# Patient Record
Sex: Female | Born: 1980 | Race: White | Hispanic: No | Marital: Married | Smoking: Never smoker
Health system: Southern US, Community
[De-identification: ages and names within clinical notes are randomized; demographics above are authoritative.]

## PROBLEM LIST (undated history)

## (undated) DIAGNOSIS — Z8049 Family history of malignant neoplasm of other genital organs: Secondary | ICD-10-CM

## (undated) DIAGNOSIS — J40 Bronchitis, not specified as acute or chronic: Secondary | ICD-10-CM

## (undated) DIAGNOSIS — Z8051 Family history of malignant neoplasm of kidney: Secondary | ICD-10-CM

## (undated) DIAGNOSIS — Z8041 Family history of malignant neoplasm of ovary: Secondary | ICD-10-CM

## (undated) DIAGNOSIS — E039 Hypothyroidism, unspecified: Secondary | ICD-10-CM

## (undated) DIAGNOSIS — T753XXA Motion sickness, initial encounter: Secondary | ICD-10-CM

## (undated) DIAGNOSIS — Z800 Family history of malignant neoplasm of digestive organs: Secondary | ICD-10-CM

## (undated) DIAGNOSIS — G43909 Migraine, unspecified, not intractable, without status migrainosus: Secondary | ICD-10-CM

## (undated) DIAGNOSIS — Z803 Family history of malignant neoplasm of breast: Secondary | ICD-10-CM

## (undated) HISTORY — DX: Family history of malignant neoplasm of other genital organs: Z80.49

## (undated) HISTORY — DX: Family history of malignant neoplasm of kidney: Z80.51

## (undated) HISTORY — DX: Family history of malignant neoplasm of digestive organs: Z80.0

## (undated) HISTORY — PX: OTHER SURGICAL HISTORY: SHX169

## (undated) HISTORY — DX: Family history of malignant neoplasm of ovary: Z80.41

## (undated) HISTORY — DX: Family history of malignant neoplasm of breast: Z80.3

## (undated) HISTORY — PX: COLONOSCOPY: SHX174

---

## 2011-11-21 ENCOUNTER — Emergency Department: Payer: Self-pay | Admitting: Emergency Medicine

## 2011-12-31 ENCOUNTER — Ambulatory Visit: Payer: Self-pay | Admitting: Family Medicine

## 2012-01-10 ENCOUNTER — Emergency Department: Payer: Self-pay | Admitting: Emergency Medicine

## 2012-01-10 LAB — URINALYSIS, COMPLETE
Bacteria: NONE SEEN
Bilirubin,UR: NEGATIVE
Blood: NEGATIVE
Glucose,UR: NEGATIVE mg/dL (ref 0–75)
Leukocyte Esterase: NEGATIVE
Protein: NEGATIVE
RBC,UR: NONE SEEN /HPF (ref 0–5)

## 2012-01-10 LAB — PREGNANCY, URINE: Pregnancy Test, Urine: NEGATIVE m[IU]/mL

## 2012-01-10 LAB — CBC
HCT: 36 % (ref 35.0–47.0)
HGB: 12.4 g/dL (ref 12.0–16.0)
MCV: 87 fL (ref 80–100)
RDW: 12.3 % (ref 11.5–14.5)

## 2012-01-10 LAB — COMPREHENSIVE METABOLIC PANEL
Albumin: 4.4 g/dL (ref 3.4–5.0)
Alkaline Phosphatase: 44 U/L — ABNORMAL LOW (ref 50–136)
Calcium, Total: 8.6 mg/dL (ref 8.5–10.1)
Co2: 22 mmol/L (ref 21–32)
Creatinine: 0.64 mg/dL (ref 0.60–1.30)
EGFR (Non-African Amer.): >60
Glucose: 82 mg/dL (ref 65–99)
Potassium: 3.6 mmol/L (ref 3.5–5.1)
SGPT (ALT): 33 U/L
Sodium: 140 mmol/L (ref 136–145)

## 2012-01-10 LAB — TSH: Thyroid Stimulating Horm: 3.64 u[IU]/mL

## 2012-01-12 ENCOUNTER — Ambulatory Visit: Payer: Self-pay | Admitting: Family Medicine

## 2012-08-06 ENCOUNTER — Ambulatory Visit: Payer: Self-pay | Admitting: Family Medicine

## 2013-11-24 HISTORY — PX: OTHER SURGICAL HISTORY: SHX169

## 2014-07-19 LAB — OB RESULTS CONSOLE ABO/RH: RH TYPE: POSITIVE

## 2014-07-19 LAB — OB RESULTS CONSOLE ANTIBODY SCREEN: ANTIBODY SCREEN: NEGATIVE

## 2014-07-19 LAB — OB RESULTS CONSOLE RPR: RPR: NONREACTIVE

## 2014-07-19 LAB — OB RESULTS CONSOLE HIV ANTIBODY (ROUTINE TESTING): HIV: NONREACTIVE

## 2014-07-19 LAB — OB RESULTS CONSOLE HEPATITIS B SURFACE ANTIGEN: Hepatitis B Surface Ag: NEGATIVE

## 2014-07-19 LAB — OB RESULTS CONSOLE RUBELLA ANTIBODY, IGM: RUBELLA: IMMUNE

## 2014-12-25 LAB — OB RESULTS CONSOLE GBS: GBS: NEGATIVE

## 2015-01-10 ENCOUNTER — Inpatient Hospital Stay (HOSPITAL_COMMUNITY): Payer: BLUE CROSS/BLUE SHIELD | Admitting: Certified Registered"

## 2015-01-10 ENCOUNTER — Encounter (HOSPITAL_COMMUNITY): Payer: Self-pay | Admitting: *Deleted

## 2015-01-10 ENCOUNTER — Inpatient Hospital Stay (HOSPITAL_COMMUNITY)
Admission: AD | Admit: 2015-01-10 | Discharge: 2015-01-13 | DRG: 766 | Disposition: A | Discharge status: Home / Self Care | Payer: BLUE CROSS/BLUE SHIELD | Source: Ambulatory Visit | Attending: Obstetrics and Gynecology | Admitting: Obstetrics and Gynecology

## 2015-01-10 ENCOUNTER — Inpatient Hospital Stay (HOSPITAL_COMMUNITY)
Admission: AD | Admit: 2015-01-10 | Discharge: 2015-01-10 | Disposition: A | Discharge status: Home / Self Care | Payer: BLUE CROSS/BLUE SHIELD | Source: Ambulatory Visit | Attending: Obstetrics and Gynecology | Admitting: Obstetrics and Gynecology

## 2015-01-10 ENCOUNTER — Encounter (HOSPITAL_COMMUNITY): Payer: Self-pay

## 2015-01-10 ENCOUNTER — Encounter (HOSPITAL_COMMUNITY)
Admission: AD | Disposition: A | Discharge status: Home / Self Care | Payer: Self-pay | Source: Ambulatory Visit | Attending: Obstetrics and Gynecology

## 2015-01-10 DIAGNOSIS — Z98891 History of uterine scar from previous surgery: Secondary | ICD-10-CM

## 2015-01-10 DIAGNOSIS — O3421 Maternal care for scar from previous cesarean delivery: Secondary | ICD-10-CM | POA: Diagnosis present

## 2015-01-10 DIAGNOSIS — Z3A38 38 weeks gestation of pregnancy: Secondary | ICD-10-CM | POA: Diagnosis present

## 2015-01-10 DIAGNOSIS — R21 Rash and other nonspecific skin eruption: Secondary | ICD-10-CM | POA: Diagnosis not present

## 2015-01-10 DIAGNOSIS — Z3A39 39 weeks gestation of pregnancy: Secondary | ICD-10-CM | POA: Diagnosis not present

## 2015-01-10 DIAGNOSIS — O471 False labor at or after 37 completed weeks of gestation: Secondary | ICD-10-CM | POA: Diagnosis present

## 2015-01-10 DIAGNOSIS — O99284 Endocrine, nutritional and metabolic diseases complicating childbirth: Secondary | ICD-10-CM | POA: Diagnosis present

## 2015-01-10 DIAGNOSIS — IMO0001 Reserved for inherently not codable concepts without codable children: Secondary | ICD-10-CM

## 2015-01-10 DIAGNOSIS — E039 Hypothyroidism, unspecified: Secondary | ICD-10-CM | POA: Diagnosis present

## 2015-01-10 HISTORY — DX: Hypothyroidism, unspecified: E03.9

## 2015-01-10 LAB — CBC
HCT: 36.7 % (ref 36.0–46.0)
Hemoglobin: 12.8 g/dL (ref 12.0–15.0)
MCH: 30.7 pg (ref 26.0–34.0)
MCHC: 34.9 g/dL (ref 30.0–36.0)
MCV: 88 fL (ref 78.0–100.0)
PLATELETS: 274 10*3/uL (ref 150–400)
RBC: 4.17 MIL/uL (ref 3.87–5.11)
RDW: 12.8 % (ref 11.5–15.5)
WBC: 9.4 10*3/uL (ref 4.0–10.5)

## 2015-01-10 LAB — TYPE AND SCREEN
ABO/RH(D): A POS
Antibody Screen: NEGATIVE

## 2015-01-10 LAB — ABO/RH: ABO/RH(D): A POS

## 2015-01-10 SURGERY — Surgical Case
Anesthesia: Spinal

## 2015-01-10 MED ORDER — LACTATED RINGERS IV SOLN
INTRAVENOUS | Status: DC | PRN
  Administered 2015-01-10 (×2): via INTRAVENOUS

## 2015-01-10 MED ORDER — CEFAZOLIN SODIUM-DEXTROSE 2-3 GM-% IV SOLR
INTRAVENOUS | Status: AC
  Filled 2015-01-10: qty 150

## 2015-01-10 MED ORDER — NALOXONE HCL 0.4 MG/ML IJ SOLN: 0.4000 mg | INTRAMUSCULAR | Status: DC | PRN

## 2015-01-10 MED ORDER — DIPHENHYDRAMINE HCL 25 MG PO CAPS: 25.0000 mg | ORAL_CAPSULE | ORAL | Status: DC | PRN

## 2015-01-10 MED ORDER — MEPERIDINE HCL 25 MG/ML IJ SOLN
6.2500 mg | INTRAMUSCULAR | Status: DC | PRN
Start: 2015-01-10 — End: 2015-01-10
  Administered 2015-01-10 (×2): 6.25 mg via INTRAVENOUS

## 2015-01-10 MED ORDER — DEXTROSE 5 % IV SOLN
1.0000 ug/kg/h | INTRAVENOUS | Status: DC | PRN
  Filled 2015-01-10: qty 2

## 2015-01-10 MED ORDER — CITRIC ACID-SODIUM CITRATE 334-500 MG/5ML PO SOLN
30.0000 mL | Freq: Once | ORAL | Status: DC
Start: 2015-01-10 — End: 2015-01-10

## 2015-01-10 MED ORDER — ONDANSETRON HCL 4 MG/2ML IJ SOLN
INTRAMUSCULAR | Status: DC | PRN
  Administered 2015-01-10: 4 mg via INTRAVENOUS

## 2015-01-10 MED ORDER — OXYCODONE-ACETAMINOPHEN 5-325 MG PO TABS
2.0000 | ORAL_TABLET | ORAL | Status: DC | PRN
Start: 2015-01-10 — End: 2015-01-13

## 2015-01-10 MED ORDER — SIMETHICONE 80 MG PO CHEW
80.0000 mg | CHEWABLE_TABLET | ORAL | Status: DC
  Administered 2015-01-11 – 2015-01-12 (×2): 80 mg via ORAL
  Filled 2015-01-10 (×3): qty 1

## 2015-01-10 MED ORDER — OXYTOCIN 40 UNITS IN LACTATED RINGERS INFUSION - SIMPLE MED
62.5000 mL/h | INTRAVENOUS | Status: DC
  Filled 2015-01-10: qty 1000

## 2015-01-10 MED ORDER — CEFAZOLIN SODIUM-DEXTROSE 2-3 GM-% IV SOLR
INTRAVENOUS | Status: DC | PRN
  Administered 2015-01-10: 2 g via INTRAVENOUS

## 2015-01-10 MED ORDER — LACTATED RINGERS IV SOLN: INTRAVENOUS | Status: DC

## 2015-01-10 MED ORDER — LACTATED RINGERS IV SOLN
INTRAVENOUS | Status: DC
  Administered 2015-01-11 (×2): via INTRAVENOUS

## 2015-01-10 MED ORDER — MEPERIDINE HCL 25 MG/ML IJ SOLN: 6.2500 mg | INTRAMUSCULAR | Status: DC | PRN

## 2015-01-10 MED ORDER — LACTATED RINGERS IV BOLUS (SEPSIS): 1000.0000 mL | Freq: Once | INTRAVENOUS | Status: DC

## 2015-01-10 MED ORDER — CITRIC ACID-SODIUM CITRATE 334-500 MG/5ML PO SOLN
30.0000 mL | ORAL | Status: DC | PRN
  Administered 2015-01-10: 30 mL via ORAL

## 2015-01-10 MED ORDER — OXYTOCIN 40 UNITS IN LACTATED RINGERS INFUSION - SIMPLE MED: 62.5000 mL/h | INTRAVENOUS | Status: AC

## 2015-01-10 MED ORDER — ACETAMINOPHEN 500 MG PO TABS
1000.0000 mg | ORAL_TABLET | Freq: Four times a day (QID) | ORAL | Status: AC
  Administered 2015-01-11 (×4): 1000 mg via ORAL
  Filled 2015-01-10 (×4): qty 2

## 2015-01-10 MED ORDER — BUPIVACAINE IN DEXTROSE 0.75-8.25 % IT SOLN
INTRATHECAL | Status: DC | PRN
  Administered 2015-01-10: 1.8 mL via INTRATHECAL

## 2015-01-10 MED ORDER — KETOROLAC TROMETHAMINE 30 MG/ML IJ SOLN
INTRAMUSCULAR | Status: AC
  Filled 2015-01-10: qty 1

## 2015-01-10 MED ORDER — ONDANSETRON HCL 4 MG/2ML IJ SOLN: 4.0000 mg | INTRAMUSCULAR | Status: DC | PRN

## 2015-01-10 MED ORDER — ACETAMINOPHEN 325 MG PO TABS: 650.0000 mg | ORAL_TABLET | ORAL | Status: DC | PRN

## 2015-01-10 MED ORDER — LACTATED RINGERS IV SOLN
INTRAVENOUS | Status: DC | PRN
  Administered 2015-01-10: 19:00:00 via INTRAVENOUS

## 2015-01-10 MED ORDER — CITRIC ACID-SODIUM CITRATE 334-500 MG/5ML PO SOLN
ORAL | Status: AC
Start: 2015-01-10 — End: 2015-01-11
  Filled 2015-01-10: qty 15

## 2015-01-10 MED ORDER — MEPERIDINE HCL 25 MG/ML IJ SOLN
INTRAMUSCULAR | Status: AC
  Filled 2015-01-10: qty 1

## 2015-01-10 MED ORDER — SENNOSIDES-DOCUSATE SODIUM 8.6-50 MG PO TABS
2.0000 | ORAL_TABLET | ORAL | Status: DC
  Administered 2015-01-11 – 2015-01-12 (×3): 2 via ORAL
  Filled 2015-01-10 (×3): qty 2

## 2015-01-10 MED ORDER — ONDANSETRON HCL 4 MG/2ML IJ SOLN: 4.0000 mg | Freq: Four times a day (QID) | INTRAMUSCULAR | Status: DC | PRN

## 2015-01-10 MED ORDER — OXYTOCIN 10 UNIT/ML IJ SOLN
40.0000 [IU] | INTRAMUSCULAR | Status: DC | PRN
  Administered 2015-01-10: 40 [IU] via INTRAVENOUS

## 2015-01-10 MED ORDER — LANOLIN HYDROUS EX OINT: 1.0000 "application " | TOPICAL_OINTMENT | CUTANEOUS | Status: DC | PRN

## 2015-01-10 MED ORDER — CEFAZOLIN SODIUM-DEXTROSE 2-3 GM-% IV SOLR: 2.0000 g | INTRAVENOUS | Status: DC

## 2015-01-10 MED ORDER — EPHEDRINE 5 MG/ML INJ: 10.0000 mg | INTRAVENOUS | Status: DC | PRN

## 2015-01-10 MED ORDER — LIDOCAINE HCL (PF) 1 % IJ SOLN
30.0000 mL | INTRAMUSCULAR | Status: DC | PRN
  Filled 2015-01-10: qty 30

## 2015-01-10 MED ORDER — ONDANSETRON HCL 4 MG/2ML IJ SOLN: 4.0000 mg | Freq: Three times a day (TID) | INTRAMUSCULAR | Status: DC | PRN

## 2015-01-10 MED ORDER — OXYCODONE-ACETAMINOPHEN 5-325 MG PO TABS: 1.0000 | ORAL_TABLET | ORAL | Status: DC | PRN

## 2015-01-10 MED ORDER — FENTANYL CITRATE 0.05 MG/ML IJ SOLN
INTRAMUSCULAR | Status: AC
  Filled 2015-01-10: qty 5

## 2015-01-10 MED ORDER — PHENYLEPHRINE 40 MCG/ML (10ML) SYRINGE FOR IV PUSH (FOR BLOOD PRESSURE SUPPORT): 80.0000 ug | PREFILLED_SYRINGE | INTRAVENOUS | Status: DC | PRN

## 2015-01-10 MED ORDER — LACTATED RINGERS IV SOLN: 500.0000 mL | INTRAVENOUS | Status: DC | PRN

## 2015-01-10 MED ORDER — TETANUS-DIPHTH-ACELL PERTUSSIS 5-2.5-18.5 LF-MCG/0.5 IM SUSP
0.5000 mL | Freq: Once | INTRAMUSCULAR | Status: DC
Start: 2015-01-11 — End: 2015-01-13

## 2015-01-10 MED ORDER — SODIUM CHLORIDE 0.9 % IJ SOLN: 3.0000 mL | INTRAMUSCULAR | Status: DC | PRN

## 2015-01-10 MED ORDER — FAMOTIDINE IN NACL 20-0.9 MG/50ML-% IV SOLN: 20.0000 mg | Freq: Once | INTRAVENOUS | Status: DC

## 2015-01-10 MED ORDER — ONDANSETRON HCL 4 MG PO TABS: 4.0000 mg | ORAL_TABLET | ORAL | Status: DC | PRN

## 2015-01-10 MED ORDER — ZOLPIDEM TARTRATE 5 MG PO TABS: 5.0000 mg | ORAL_TABLET | Freq: Every evening | ORAL | Status: DC | PRN

## 2015-01-10 MED ORDER — MENTHOL 3 MG MT LOZG: 1.0000 | LOZENGE | OROMUCOSAL | Status: DC | PRN

## 2015-01-10 MED ORDER — PHENYLEPHRINE 8 MG IN D5W 100 ML (0.08MG/ML) PREMIX OPTIME
INJECTION | INTRAVENOUS | Status: DC | PRN
  Administered 2015-01-10: 60 ug/min via INTRAVENOUS

## 2015-01-10 MED ORDER — NALBUPHINE HCL 10 MG/ML IJ SOLN: 5.0000 mg | INTRAMUSCULAR | Status: DC | PRN

## 2015-01-10 MED ORDER — DIPHENHYDRAMINE HCL 25 MG PO CAPS: 25.0000 mg | ORAL_CAPSULE | Freq: Four times a day (QID) | ORAL | Status: DC | PRN

## 2015-01-10 MED ORDER — OXYTOCIN BOLUS FROM INFUSION: 500.0000 mL | INTRAVENOUS | Status: DC

## 2015-01-10 MED ORDER — IBUPROFEN 600 MG PO TABS
600.0000 mg | ORAL_TABLET | Freq: Four times a day (QID) | ORAL | Status: DC
  Administered 2015-01-11 – 2015-01-13 (×10): 600 mg via ORAL
  Filled 2015-01-10 (×10): qty 1

## 2015-01-10 MED ORDER — FENTANYL 2.5 MCG/ML BUPIVACAINE 1/10 % EPIDURAL INFUSION (WH - ANES): 14.0000 mL/h | INTRAMUSCULAR | Status: DC | PRN

## 2015-01-10 MED ORDER — OXYCODONE-ACETAMINOPHEN 5-325 MG PO TABS: 2.0000 | ORAL_TABLET | ORAL | Status: DC | PRN

## 2015-01-10 MED ORDER — LACTATED RINGERS IV SOLN
INTRAVENOUS | Status: DC
  Administered 2015-01-10: 17:00:00 via INTRAVENOUS

## 2015-01-10 MED ORDER — SCOPOLAMINE 1 MG/3DAYS TD PT72
1.0000 | MEDICATED_PATCH | Freq: Once | TRANSDERMAL | Status: DC
  Administered 2015-01-10: 1.5 mg via TRANSDERMAL

## 2015-01-10 MED ORDER — SIMETHICONE 80 MG PO CHEW
80.0000 mg | CHEWABLE_TABLET | Freq: Three times a day (TID) | ORAL | Status: DC
  Administered 2015-01-11 – 2015-01-12 (×6): 80 mg via ORAL
  Filled 2015-01-10 (×7): qty 1

## 2015-01-10 MED ORDER — KETOROLAC TROMETHAMINE 30 MG/ML IJ SOLN: 30.0000 mg | Freq: Four times a day (QID) | INTRAMUSCULAR | Status: AC | PRN

## 2015-01-10 MED ORDER — CEFAZOLIN SODIUM-DEXTROSE 2-3 GM-% IV SOLR
2.0000 g | Freq: Three times a day (TID) | INTRAVENOUS | Status: AC
  Administered 2015-01-11 (×2): 2 g via INTRAVENOUS
  Filled 2015-01-10 (×2): qty 50

## 2015-01-10 MED ORDER — FENTANYL CITRATE 0.05 MG/ML IJ SOLN: 25.0000 ug | INTRAMUSCULAR | Status: DC | PRN

## 2015-01-10 MED ORDER — NALBUPHINE HCL 10 MG/ML IJ SOLN: 5.0000 mg | Freq: Once | INTRAMUSCULAR | Status: AC | PRN

## 2015-01-10 MED ORDER — ONDANSETRON HCL 4 MG/2ML IJ SOLN: 4.0000 mg | Freq: Once | INTRAMUSCULAR | Status: AC | PRN

## 2015-01-10 MED ORDER — MORPHINE SULFATE (PF) 0.5 MG/ML IJ SOLN
INTRAMUSCULAR | Status: DC | PRN
  Administered 2015-01-10: .2 mg via INTRATHECAL

## 2015-01-10 MED ORDER — OXYCODONE-ACETAMINOPHEN 5-325 MG PO TABS
1.0000 | ORAL_TABLET | ORAL | Status: DC | PRN
  Administered 2015-01-11 – 2015-01-13 (×5): 1 via ORAL
  Filled 2015-01-10 (×6): qty 1

## 2015-01-10 MED ORDER — CITRIC ACID-SODIUM CITRATE 334-500 MG/5ML PO SOLN
ORAL | Status: AC
  Filled 2015-01-10: qty 15

## 2015-01-10 MED ORDER — TERBUTALINE SULFATE 1 MG/ML IJ SOLN
INTRAMUSCULAR | Status: AC
  Administered 2015-01-10: 0.25 mg via SUBCUTANEOUS
  Filled 2015-01-10: qty 1

## 2015-01-10 MED ORDER — DIPHENHYDRAMINE HCL 50 MG/ML IJ SOLN: 12.5000 mg | INTRAMUSCULAR | Status: DC | PRN

## 2015-01-10 MED ORDER — MORPHINE SULFATE 0.5 MG/ML IJ SOLN
INTRAMUSCULAR | Status: AC
  Filled 2015-01-10: qty 10

## 2015-01-10 MED ORDER — BUTORPHANOL TARTRATE 1 MG/ML IJ SOLN
1.0000 mg | Freq: Once | INTRAMUSCULAR | Status: AC
  Administered 2015-01-10: 1 mg via INTRAVENOUS
  Filled 2015-01-10: qty 1

## 2015-01-10 MED ORDER — WITCH HAZEL-GLYCERIN EX PADS: 1.0000 "application " | MEDICATED_PAD | CUTANEOUS | Status: DC | PRN

## 2015-01-10 MED ORDER — DIBUCAINE 1 % RE OINT: 1.0000 "application " | TOPICAL_OINTMENT | RECTAL | Status: DC | PRN

## 2015-01-10 MED ORDER — LACTATED RINGERS IV SOLN: 500.0000 mL | Freq: Once | INTRAVENOUS | Status: DC

## 2015-01-10 MED ORDER — FENTANYL CITRATE 0.05 MG/ML IJ SOLN
INTRAMUSCULAR | Status: AC
  Filled 2015-01-10: qty 2

## 2015-01-10 MED ORDER — SIMETHICONE 80 MG PO CHEW
80.0000 mg | CHEWABLE_TABLET | ORAL | Status: DC | PRN
  Administered 2015-01-12: 80 mg via ORAL

## 2015-01-10 MED ORDER — FENTANYL CITRATE 0.05 MG/ML IJ SOLN
INTRAMUSCULAR | Status: DC | PRN
  Administered 2015-01-10: 10 ug via INTRATHECAL

## 2015-01-10 MED ORDER — SCOPOLAMINE 1 MG/3DAYS TD PT72
MEDICATED_PATCH | TRANSDERMAL | Status: AC
  Filled 2015-01-10: qty 1

## 2015-01-10 MED ORDER — MIDAZOLAM HCL 2 MG/2ML IJ SOLN
INTRAMUSCULAR | Status: AC
  Filled 2015-01-10: qty 2

## 2015-01-10 MED ORDER — KETOROLAC TROMETHAMINE 30 MG/ML IJ SOLN
30.0000 mg | Freq: Four times a day (QID) | INTRAMUSCULAR | Status: AC | PRN
  Administered 2015-01-10: 30 mg via INTRAMUSCULAR

## 2015-01-10 MED ORDER — 0.9 % SODIUM CHLORIDE (POUR BTL) OPTIME
TOPICAL | Status: DC | PRN
  Administered 2015-01-10: 1000 mL

## 2015-01-10 MED ORDER — TERBUTALINE SULFATE 1 MG/ML IJ SOLN
0.2500 mg | Freq: Once | INTRAMUSCULAR | Status: AC
  Administered 2015-01-10: 0.25 mg via SUBCUTANEOUS

## 2015-01-10 SURGICAL SUPPLY — 29 items
CLAMP CORD UMBIL (MISCELLANEOUS) IMPLANT
CLOTH BEACON ORANGE TIMEOUT ST (SAFETY) ×2 IMPLANT
CONTAINER PREFILL 10% NBF 15ML (MISCELLANEOUS) IMPLANT
DRAPE SHEET LG 3/4 BI-LAMINATE (DRAPES) IMPLANT
DRSG OPSITE POSTOP 4X10 (GAUZE/BANDAGES/DRESSINGS) ×2 IMPLANT
DRSG VASELINE 3X18 (GAUZE/BANDAGES/DRESSINGS) ×2 IMPLANT
DURAPREP 26ML APPLICATOR (WOUND CARE) ×2 IMPLANT
ELECT REM PT RETURN 9FT ADLT (ELECTROSURGICAL) ×2
ELECTRODE REM PT RTRN 9FT ADLT (ELECTROSURGICAL) ×1 IMPLANT
EXTRACTOR VACUUM KIWI (MISCELLANEOUS) IMPLANT
EXTRACTOR VACUUM M CUP 4 TUBE (SUCTIONS) IMPLANT
GLOVE BIO SURGEON STRL SZ7.5 (GLOVE) ×2 IMPLANT
GOWN STRL REUS W/TWL LRG LVL3 (GOWN DISPOSABLE) ×4 IMPLANT
KIT ABG SYR 3ML LUER SLIP (SYRINGE) IMPLANT
NEEDLE HYPO 25X5/8 SAFETYGLIDE (NEEDLE) IMPLANT
NS IRRIG 1000ML POUR BTL (IV SOLUTION) ×2 IMPLANT
PACK C SECTION WH (CUSTOM PROCEDURE TRAY) ×2 IMPLANT
PAD OB MATERNITY 4.3X12.25 (PERSONAL CARE ITEMS) ×2 IMPLANT
RTRCTR C-SECT PINK 25CM LRG (MISCELLANEOUS) IMPLANT
STAPLER VISISTAT 35W (STAPLE) ×2 IMPLANT
SUT PLAIN 0 NONE (SUTURE) IMPLANT
SUT VIC AB 0 CT1 36 (SUTURE) ×16 IMPLANT
SUT VIC AB 3-0 CTX 36 (SUTURE) ×2 IMPLANT
SUT VIC AB 3-0 SH 27 (SUTURE) ×1
SUT VIC AB 3-0 SH 27X BRD (SUTURE) ×1 IMPLANT
SUT VIC AB 4-0 KS 27 (SUTURE) IMPLANT
SUT VICRYL 0 TIES 12 18 (SUTURE) IMPLANT
TOWEL OR 17X24 6PK STRL BLUE (TOWEL DISPOSABLE) ×2 IMPLANT
TRAY FOLEY CATH 14FR (SET/KITS/TRAYS/PACK) ×2 IMPLANT

## 2015-01-10 NOTE — Op Note (Signed)
Brandi [name], Brandi [name]              ACCOUNT NO.:  [id number]  MEDICAL RECORD NO.:  [id number]  LOCATION:  [facility]                          FACILITY:  [facility]  PHYSICIAN:  Lucille Passy. Ulanda Edison, M.D. DATE OF BIRTH:  1981/08/19  DATE OF PROCEDURE:  01/10/2015 DATE OF DISCHARGE:                              OPERATIVE REPORT   PREOPERATIVE DIAGNOSES:  Intrauterine pregnancy at 38 weeks 5 days, prior C-section, scheduled for C-section in 6 days, but went into active labor, reached full dilatation and had decelerations of the heart rate. She did not want to take any chance on having a problem, so she wanted the C-section. I advised her that we could try the vaginal and she declined.  POSTOPERATIVE DIAGNOSES:  Intrauterine pregnancy at 38 weeks 5 days, prior C-section, scheduled for C-section in 6 days, but went into active labor, reached full dilatation and had decelerations of the heart rate. She did not want to take any chance on having a problem, so she wanted the C-section.  I advised her that we could try the vaginal and she declined.  OPERATION:  Low-transverse cervical C-section.  OPERATOR:  Melina Schools, MD  ANESTHESIA:  Spinal anesthesia.  In the delivery room, the patient was closed to fully dilated, was pushing, and the fetal heart rate was dropping into the 70s with each push.  The vertex was at a 0 station, LOT.  She was advised of the situation.  She said please let me have a C-section, so we took her to the operating room.  I again informed her that she could possibly push the baby out.  She did not want to try.  Her husband did not want to try.  They wanted a C-section.  The patient was given a spinal anesthetic by Dr. __________.  The urethra was prepped with Betadine solution.  A Foley catheter was inserted to straight drain.  The abdomen was prepped with DuraPrep.  A time-out was done and just before this, I had examined the patient and found the cervix to be fully  dilated. Vertex was at a 0 station in LOT position.  I again informed her that since we were in the operating room, we could let her push; she did not want to.  After a 3-minute delay, the patient was draped as a sterile field.  I pinched the lower abdomen with an Allis clamp.  She was anesthetic.  I made an incision through the old scar, carried in layers through the skin, subcutaneous tissue, and fascia.  The fascia was separated from the rectus muscles superiorly and inferiorly.  Rectus muscle was split in the midline.  Peritoneum was opened vertically.  An Alexis retractor was placed into the abdominal cavity.  The lower uterine segment was exposed.  The head was down in the vagina, so I could not feel the head.  I  made an incision through the lower uterine segment transversely through the superficial layers of the myometrium. I then went the rest of the way into the uterine cavity with my finger, pulled superiorly and inferiorly.  The shoulder was in the incision.  I reached down in the pelvis, delivered the head without difficulty,  suctioned the baby's nose and pharynx, delivered the rest of the baby, and gave the baby to the surgical assistant, who carried the baby to Dr. Allie Bossier, who assigned Apgars.  The baby was crying lustily on the abdominal wall and on its way to the waiting neonatologist.  The cord blood was obtained.  The placenta was removed intact.  The inside of the uterus was found to be free of any debris.  The tubes and ovaries appeared normal as did the uterus because the head was so low in the pelvis.  The incision was close to the cervix, and it did not seem to extend any significant amount, so I closed the uterus in 2 layers using a running lock suture of 0 Vicryl first layer nonlocking suture of the same material on the second layer.  A couple of small bleeders were bovied.  Liberal irrigation confirmed hemostasis.  I had used a pack to pack the bowel out of the  way.  I removed the pack.  There was no bleeding, so before I closed the abdominal wall, the nurse anesthetist said that we had a little bit of blood in the urine, and my impression was that I had probably not injured the bladder, that I probably pushed on the Foley catheter  getting the baby's head out, but I did inspect the bladder very carefully and saw no injury that might have been a serosal tear, and I sutured this with 3-0 Vicryl, but there was no entry into the bladder.  I closed the abdominal wall with interrupted sutures of 0 Vicryl including the rectus muscle and peritoneum in one layer.  I closed the fascia with 2 running sutures of 0 Vicryl.  The subcutaneous tissue with a running 3-0 Vicryl and the skin was closed with automatic staples.  I gave the patient a choice of staples and sutures.  She thought had been closed with staples the first time and elected to go with staples.  The patient seemed to tolerate the procedure well.  Blood loss was thought to be about 800 mL.  Sponge and needle counts were correct, and the patient was returned to recovery in satisfactory condition.     Lucille Passy. Ulanda Edison, M.D.     TFH/MEDQ  D:  01/10/2015  T:  01/10/2015  Job:  [id number]

## 2015-01-10 NOTE — Anesthesia Preprocedure Evaluation (Signed)
Anesthesia Evaluation  Patient identified by MRN, date of birth, ID band Patient awake    Reviewed: Allergy & Precautions, NPO status , Patient's Chart, lab work & pertinent test results, reviewed documented beta blocker date and time Preop documentation limited or incomplete due to emergent nature of procedure.  History of Anesthesia Complications Negative for: history of anesthetic complications  Airway Mallampati: II  TM Distance: >3 FB Neck ROM: Full    Dental no notable dental hx. (+) Dental Advisory Given   Pulmonary neg pulmonary ROS,  breath sounds clear to auscultation  Pulmonary exam normal       Cardiovascular negative cardio ROS  Rhythm:Regular Rate:Normal     Neuro/Psych negative neurological ROS  negative psych ROS   GI/Hepatic negative GI ROS, Neg liver ROS,   Endo/Other  negative endocrine ROSHypothyroidism   Renal/GU negative Renal ROS  negative genitourinary   Musculoskeletal negative musculoskeletal ROS (+)   Abdominal   Peds negative pediatric ROS (+)  Hematology negative hematology ROS (+)   Anesthesia Other Findings   Reproductive/Obstetrics (+) Pregnancy                             Anesthesia Physical Anesthesia Plan  ASA: II and emergent  Anesthesia Plan: Spinal   Post-op Pain Management:    Induction:   Airway Management Planned:   Additional Equipment:   Intra-op Plan:   Post-operative Plan:   Informed Consent: I have reviewed the patients History and Physical, chart, labs and discussed the procedure including the risks, benefits and alternatives for the proposed anesthesia with the patient or authorized representative who has indicated his/her understanding and acceptance.   Dental advisory given  Plan Discussed with: CRNA  Anesthesia Plan Comments:         Anesthesia Quick Evaluation

## 2015-01-10 NOTE — Progress Notes (Signed)
Dr. Ulanda Edison discussing current status with patient and husband. Patient states she will attempt labor if she can have an epidural. Dr. Ulanda Edison requests that patient be transferred to [facility] and be given epidural.

## 2015-01-10 NOTE — Progress Notes (Signed)
Notified of pt arrival in MAU, contractions patten, pain level. Received orders to start IV, draw labs and give 1mg  stadol IV. Call back in 30 min with cervical exam

## 2015-01-10 NOTE — Anesthesia Procedure Notes (Signed)
Spinal Patient location during procedure: OR Start time: 01/10/2015 6:20 PM Staffing Anesthesiologist: Milana Obey Performed by: anesthesiologist  Preanesthetic Checklist Completed: patient identified, site marked, surgical consent, pre-op evaluation, timeout performed, IV checked, risks and benefits discussed and monitors and equipment checked Spinal Block Patient position: sitting Prep: ChloraPrep Patient monitoring: continuous pulse ox, blood pressure and heart rate Approach: midline Location: L3-4 Injection technique: single-shot Needle Needle type: Sprotte  Needle gauge: 24 G Needle length: 9 cm Assessment Sensory level: T4 Additional Notes  Functioning IV was confirmed and monitors were applied. Sterile prep and drape, including hand hygiene, mask and sterile gloves were used. The patient was positioned and the spine was prepped. The skin was anesthetized with lidocaine.  Free flow of clear CSF was obtained prior to injecting local anesthetic into the CSF.  The spinal needle aspirated freely following injection.  The needle was carefully withdrawn.  The patient tolerated the procedure well. Consent was obtained prior to procedure with all questions answered and concerns addressed. Risks including but not limited to bleeding, infection, nerve damage, paralysis, failed block, inadequate analgesia, allergic reaction, high spinal, itching and headache were discussed and the patient wished to proceed.   Lauretta Grill, MD

## 2015-01-10 NOTE — Anesthesia Postprocedure Evaluation (Signed)
  Anesthesia Post-op Note  Patient: Brandi [name]  Procedure(s) Performed: Procedure(s): CESAREAN SECTION (N/A)  Patient is awake, responsive, moving her legs, and has signs of resolution of her numbness. Pain and nausea are reasonably well controlled. Vital signs are stable and clinically acceptable. Oxygen saturation is clinically acceptable. There are no apparent anesthetic complications at this time. Patient is ready for discharge.

## 2015-01-10 NOTE — MAU Note (Signed)
Dr. Marvel Plan given report and patient to be reexamined in a couple of hours. Patient is unsure whether she would like to have a c-section or TOLAC. Pt. Having questions about both and nurse answering some of those questions. Dr. Ulanda Edison to assess patient in a couple of hours and answer some of those questions if a decision needs to be made.

## 2015-01-10 NOTE — H&P (Signed)
Brandi [name], [name]              ACCOUNT NO.:  [id number]  MEDICAL RECORD NO.:  [id number]  LOCATION:  4081                          FACILITY:  [facility]  PHYSICIAN:  Brandi [name]. Brandi [name], M.D. DATE OF BIRTH:  [birth year]  DATE OF ADMISSION:  01/10/2015 DATE OF DISCHARGE:                             HISTORY & PHYSICAL   PRESENT ILLNESS:  This is a 34 year old white female, para 1-0-0-1, gravida 2, EDC January 19, 2015, admitted in labor with a prior C- section, considering vaginal birth after cesarean.  Blood group and type A positive, negative antibody, RPR negative, urine culture negative, hepatitis B surface antigen negative, HIV negative, GC and Chlamydia negative, rubella immune, cystic fibrosis screen negative, first trimester screen negative, Panorama low risk for chromosomal abnormality, 1-hour Glucola 112, repeat HIV and RPR negative, and group B strep negative.  This patient began her prenatal course at 13 weeks and 5 days and was told by her last obstetric provider that she should probably just have a repeat C-section.  Ultrasound at 18 weeks showed normal anatomy and growth.  The patient still considered vaginal birth after cesarean at 28 weeks, but C-section was scheduled for the due date if the patient did not go into labor and had an unfavorable cervix.  The patient began her Synthroid at 25 mcg daily.  It has been increased to 75 mcg daily in a stepwise fashion.  At 36 weeks, discussion was made about moving her C-section up, but she was still thinking about it.  At her last visit at 38 weeks and 3 days, her cervix was not dilated.  The vertex was at a -4 station.  She came to the hospital this morning and was observed for several hours without any change in her cervix.  Her contractions were every 2 to 10 minutes.  She came back to the hospital, hurting a lot more.  She was thought to be 1.5 cm.  The cervix had thinned out some and the station of the head was thought to be  lower. Later exam showed the cervix to be 3 to 4 cm, 90%, vertex at a 0 station.  She was admitted to have an epidural and she will give further consideration to doing a VBAC.  PAST MEDICAL HISTORY:  Hypothyroidism.  PAST SURGICAL HISTORY:  Laparoscopy x2, 1 with lysis of adhesions, cesarean section in June 2011.  ALLERGIES:  NEOSPORIN caused a rash and sulfa caused a rash.  SOCIAL HISTORY:  She never smoked.  Occasional alcohol.  No drugs. Works as a Probation officer.  FAMILY HISTORY:  Mother, cancer of the breast and maternal grandmother, cancer of the breast and cancer of the uterus.  OBSTETRIC HISTORY:  The patient did deliver by C-section in 2011.  She states she became fully dilated, pushed to deliver the baby, was unable to push the baby out, so she had a vacuum applied, this did not effect delivery of the baby, heart rate crashed, she had a stat C-section under general anesthesia.  PHYSICAL EXAMINATION:  GENERAL:  On admission, well-developed, well- nourished white female, in distress from uterine contractions. VITAL SIGNS:  Blood pressure is 111/76, temperature 98.7, pulse 85, respirations 16. HEART:  Normal size and sounds.  No murmurs. LUNGS:  Clear to auscultation. ABDOMEN:  Soft.  At her last prenatal visit, the fundal height was 37 cm at 38 weeks and 3 days.  Per the RN on admission, the cervix is 3 to 4 cm, 90%, vertex at a 0 station.  Fetal heart rate tracing is category 1.  ADMITTING IMPRESSION:  Intrauterine pregnancy at 38 weeks and 5 days, prior cesarean section, active labor.  The patient is admitted to receive an epidural and further consideration of whether vaginal birth after cesarean section or repeat cesarean section.     Brandi [name]. Brandi [name], M.D.     TFH/MEDQ  D:  01/10/2015  T:  01/10/2015  Job:  [id number]

## 2015-01-10 NOTE — MAU Note (Signed)
Pt presents to MAU with more intense contractions. Every 2-3 minutes. Denies leaking of fluid or vaginal bleeding.

## 2015-01-10 NOTE — MAU Note (Signed)
Pt states that she began to contract in the night but that contractions became regular beginning at 0300. Pt states that baby is active. Denies leaking of fluid and bleeding.

## 2015-01-10 NOTE — MAU Note (Signed)
Pt reports contractions all night, denies bleeding or ROM

## 2015-01-10 NOTE — Transfer of Care (Signed)
Immediate Anesthesia Transfer of Care Note  Patient: Brandi [name]  Procedure(s) Performed: Procedure(s): CESAREAN SECTION (N/A)  Patient Location: PACU  Anesthesia Type:Spinal  Level of Consciousness: awake  Airway & Oxygen Therapy: Patient Spontanous Breathing  Post-op Assessment: Report given to RN  Post vital signs: Reviewed and stable  Last Vitals:  Filed Vitals:   01/10/15 1815  Pulse:   Resp: 18    Complications: No apparent anesthesia complications

## 2015-01-10 NOTE — Discharge Instructions (Signed)
Vaginal Birth After Cesarean Delivery Vaginal birth after cesarean delivery (VBAC) is giving birth vaginally after previously delivering a baby by a cesarean. In the past, if a woman had a cesarean delivery, all births afterward would be done by cesarean delivery. This is no longer true. It can be safe for the mother to try a vaginal delivery after having a cesarean delivery.  It is important to discuss VBAC with your health care provider early in the pregnancy so you can understand the risks, benefits, and options. It will give you time to decide what is best in your particular case. The final decision about whether to have a VBAC or repeat cesarean delivery should be between you and your health care provider. Any changes in your health or your baby's health during your pregnancy may make it necessary to change your initial decision about VBAC.  WOMEN WHO PLAN TO HAVE A VBAC SHOULD CHECK WITH THEIR HEALTH CARE PROVIDER TO BE SURE THAT:  The previous cesarean delivery was done with a low transverse uterine cut (incision) (not a vertical classical incision).   The birth canal is big enough for the baby.   There were no other operations on the uterus.   An electronic fetal monitor (EFM) will be on at all times during labor.   An operating room will be available and ready in case an emergency cesarean delivery is needed.   A health care provider and surgical nursing staff will be available at all times during labor to be ready to do an emergency delivery cesarean if necessary.   An anesthesiologist will be present in case an emergency cesarean delivery is needed.   The nursery is prepared and has adequate personnel and necessary equipment available to care for the baby in case of an emergency cesarean delivery. BENEFITS OF VBAC  Shorter stay in the hospital.   Avoidance of risks associated with cesarean delivery, such as:  Surgical complications, such as opening of the incision or  hernia in the incision.  Injury to other organs.  Fever. This can occur if an infection develops after surgery. It can also occur as a reaction to the medicine given to make you numb during the surgery.  Less blood loss and need for blood transfusions.  Lower risk of blood clots and infection.  Shorter recovery.   Decreased risk for having to remove the uterus (hysterectomy).   Decreased risk for the placenta to completely or partially cover the opening of the uterus (placenta previa) with a future pregnancy.   Decrease risk in future labor and delivery. RISKS OF A VBAC  Tearing (rupture) of the uterus. This is occurs in less than 1% of VBACs. The risk of this happening is higher if:  Steps are taken to begin the labor process (induce labor) or stimulate or strengthen contractions (augment labor).   Medicine is used to soften (ripen) the cervix.  Having to remove the uterus (hysterectomy) if it ruptures. VBAC SHOULD NOT BE DONE IF:  The previous cesarean delivery was done with a vertical (classical) or T-shaped incision or you do not know what kind of incision was made.   You had a ruptured uterus.   You have had certain types of surgery on your uterus, such as removal of uterine fibroids. Ask your health care provider about other types of surgeries that prevent you from having a VBAC.  You have certain medical or childbirth (obstetrical) problems.   There are problems with the baby.   You   have had two previous cesarean deliveries and no vaginal deliveries. OTHER FACTS TO KNOW ABOUT VBAC:  It is safe to have an epidural anesthetic with VBAC.   It is safe to turn the baby from a breech position (attempt an external cephalic version).   It is safe to try a VBAC with twins.   VBAC may not be successful if your baby weights 8.8 lb (4 kg) or more. However, weight predictions are not always accurate and should not be used alone to decide if VBAC is right for  you.  There is an increased failure rate if the time between the cesarean delivery and VBAC is less than 19 months.   Your health care provider may advise against a VBAC if you have preeclampsia (high blood pressure, protein in the urine, and swelling of face and extremities).   VBAC is often successful if you previously gave birth vaginally.   VBAC is often successful when the labor starts spontaneously before the due date.   Delivering a baby through a VBAC is similar to having a normal spontaneous vaginal delivery. Document Released: 05/03/2007 Document Revised: 03/27/2014 Document Reviewed: 06/09/2013 ExitCare Patient Information 2015 [employer]. This information is not intended to replace advice given to you by your health care provider. Make sure you discuss any questions you have with your health care provider.  

## 2015-01-11 ENCOUNTER — Encounter (HOSPITAL_COMMUNITY): Payer: Self-pay | Admitting: Obstetrics and Gynecology

## 2015-01-11 LAB — CBC
HCT: 23.9 % — ABNORMAL LOW (ref 36.0–46.0)
Hemoglobin: 8.5 g/dL — ABNORMAL LOW (ref 12.0–15.0)
MCH: 31.4 pg (ref 26.0–34.0)
MCHC: 35.6 g/dL (ref 30.0–36.0)
MCV: 88.2 fL (ref 78.0–100.0)
Platelets: 198 10*3/uL (ref 150–400)
RBC: 2.71 MIL/uL — ABNORMAL LOW (ref 3.87–5.11)
RDW: 12.9 % (ref 11.5–15.5)
WBC: 9 10*3/uL (ref 4.0–10.5)

## 2015-01-11 LAB — RPR: RPR: NONREACTIVE

## 2015-01-11 NOTE — Anesthesia Postprocedure Evaluation (Signed)
Anesthesia Post Note  Patient: Brandi [name]  Procedure(s) Performed: Procedure(s) (LRB): CESAREAN SECTION (N/A)  Anesthesia type: Epidural  Patient location: Mother/Baby  Post pain: Pain level controlled  Post assessment: Post-op Vital signs reviewed  Last Vitals:  Filed Vitals:   01/11/15 0900  BP: 93/51  Pulse: 68  Temp: 36.9 C  Resp: 16    Post vital signs: Reviewed  Level of consciousness:alert  Complications: No apparent anesthesia complications

## 2015-01-11 NOTE — Progress Notes (Signed)
Removed catheter at 0900. Patient felt like she needed to void at 1115.  When sitting on toilet, patient passed 165mL clots and blood into measuring hat.  Pt unable to void.  Put patient back to bed.  Unable to definitely feel fundus.  Had Melonie Florida RN attempt without success. Bladder scanned patient but unable to get any urine to show possible because pressure dressing in way.  Pt crying with pain at this point and asked to be left alone. Patient had received a percocet 1 hour prior to this.  Called Dr Melba Coon and discussed difficulty finding fundus, no results from bladder scan, patient pain level, and passing 13mL of blood.  Dr Melba Coon said that pressure dressing could be removed to assess fundus and bladder.  No change in meds at this time.  Will allow patient to rest for now, let percocet work, and reassess fundus and bladder.

## 2015-01-11 NOTE — Addendum Note (Signed)
Addendum  created 01/11/15 1046 by Flossie Dibble, CRNA   Modules edited: Charges VN, Notes Section   Notes Section:  File: [id number]

## 2015-01-11 NOTE — Lactation Note (Signed)
This note was copied from the chart of Brandi [name]. Lactation Consultation Note  Initial visit made.  Breastfeeding consultation services and support information given and reviewed with mom.  Baby is showing early feeding cues.  Positioned skin to skin in football hold.  Mom has erect, short nipples.  Breast tissue compressible and colostrum abundant with hand expression.  Baby latched off and on for about 6 minutes but sleepy at breast.  Sibling arrived to visit with baby so mom would like to try feeding later.  Shells given and instructed on use.  Mom also has a manual pump to use for pre pumping.  Encouraged to call for assist prn.  Patient Name: Brandi Bryanne Riquelme Today's Date: 01/11/2015 Reason for consult: Initial assessment   Maternal Data Formula Feeding for Exclusion: No Has patient been taught Hand Expression?: Yes Does the patient have breastfeeding experience prior to this delivery?: Yes  Feeding Feeding Type: Breast Fed Length of feed: 6 min  LATCH Score/Interventions Latch: Repeated attempts needed to sustain latch, nipple held in mouth throughout feeding, stimulation needed to elicit sucking reflex. Intervention(s): Adjust position;Assist with latch;Breast massage;Breast compression  Audible Swallowing: A few with stimulation  Type of Nipple: Everted at rest and after stimulation  Comfort (Breast/Nipple): Soft / non-tender     Hold (Positioning): Assistance needed to correctly position infant at breast and maintain latch. Intervention(s): Breastfeeding basics reviewed;Support Pillows;Position options;Skin to skin  LATCH Score: 7  Lactation Tools Discussed/Used     Consult Status Consult Status: Follow-up Date: 01/12/15 Follow-up type: In-patient    Ave Filter 01/11/2015, 9:28 AM

## 2015-01-11 NOTE — Progress Notes (Signed)
Patient ID: Brandi [name], female   DOB: 10/28/1981, 34 y.o.   MRN: [id number] #1 afebrile BP normal Output excellent Abdomen soft, flat and not tender. HGB drop more than anticipated but no sign of bleeding

## 2015-01-12 LAB — BIRTH TISSUE RECOVERY COLLECTION (PLACENTA DONATION)

## 2015-01-12 MED ORDER — DOUBLE ANTIBIOTIC 500-10000 UNIT/GM EX OINT
TOPICAL_OINTMENT | Freq: Two times a day (BID) | CUTANEOUS | Status: DC | PRN
  Administered 2015-01-12: 16:00:00 via TOPICAL
  Filled 2015-01-12 (×2): qty 1

## 2015-01-12 NOTE — Progress Notes (Signed)
Patient ID: Brandi [name], female   DOB: 02/26/1981, 34 y.o.   MRN: [id number] #2 afebrile BP normal Pt. Is tolerating a regular diet, passing flatus, voiding well and having onlt scant lochia. Her abdomen is soft and not tender.

## 2015-01-12 NOTE — Lactation Note (Signed)
This note was copied from the chart of Brandi Fredrick Dray. Lactation Consultation Note  Mom called with concern because she has a small blood blister on her left nipple.  Observed baby latched well to right breast.  Baby nursing actively.  Reminded mom to hold baby in close during feeding.  Comfort gels given with instructions.  Encouraged to call with concerns/assist prn.  Patient Name: Brandi [name] Date: 01/12/2015 Reason for consult: Follow-up assessment   Maternal Data    Feeding Feeding Type: Breast Fed  LATCH Score/Interventions Latch: Grasps breast easily, tongue down, lips flanged, rhythmical sucking.  Audible Swallowing: A few with stimulation Intervention(s): Alternate breast massage;Skin to skin  Type of Nipple: Everted at rest and after stimulation  Comfort (Breast/Nipple): Filling, red/small blisters or bruises, mild/mod discomfort  Problem noted: Mild/Moderate discomfort;Cracked, bleeding, blisters, bruises Interventions (Mild/moderate discomfort): Comfort gels  Hold (Positioning): Assistance needed to correctly position infant at breast and maintain latch. Intervention(s): Breastfeeding basics reviewed  LATCH Score: 7  Lactation Tools Discussed/Used     Consult Status Consult Status: Follow-up Date: 01/13/15 Follow-up type: In-patient    Ave Filter 01/12/2015, 12:29 PM

## 2015-01-12 NOTE — Progress Notes (Signed)
Charted in error.

## 2015-01-13 MED ORDER — OXYCODONE-ACETAMINOPHEN 5-325 MG PO TABS: 1.0000 | ORAL_TABLET | ORAL | Status: DC | PRN

## 2015-01-13 MED ORDER — IBUPROFEN 600 MG PO TABS: 600.0000 mg | ORAL_TABLET | Freq: Four times a day (QID) | ORAL | Status: DC

## 2015-01-13 MED ORDER — CEFAZOLIN SODIUM-DEXTROSE 2-3 GM-% IV SOLR: 2.0000 g | INTRAVENOUS | Status: DC

## 2015-01-13 NOTE — Discharge Instructions (Signed)
As per discharge pamphlet °

## 2015-01-13 NOTE — Lactation Note (Signed)
This note was copied from the chart of Brandi Brandi [name]. Lactation Consultation Note  Patient Name: Brandi [name] [id number] Date: 01/13/2015 Reason for consult: Follow-up assessment   With this mom of  A term baby, now 25 hous old and at 9% weight loss. Mom's milk has transitionind in. Her breasts aare full but soft - no engorgement noted. Mom having trouble obtaning a deep latch on her right breast - nipple getting pinched. Mom has been using cradle hold. I advised her to use cross cradle hold to latch , and she will obtain a deeper latch. Basic teaching on breast care done. Mom knows to call for questions/concerns to lactation as nedeed.    Maternal Data    Feeding    LATCH Score/Interventions                      Lactation Tools Discussed/Used     Consult Status Consult Status: Complete Follow-up type: Call as needed    Tonna Corner 01/13/2015, 9:44 AM

## 2015-01-13 NOTE — Lactation Note (Signed)
This note was copied from the chart of Brandi [name]. Lactation Consultation Note  Patient Name: Brandi Yolunda Kloos [id number] Date: 01/13/2015 Reason for consult: Follow-up assessment    I assisted mom with cross cradle latch, and baby latched well, with vigorous suckles and swallows.  The baby may have a tight lingual frenulum, but I did not fully assess baby's mouth. Mom reports no pinching on her left breast, only on the right.   Maternal Data    Feeding Feeding Type: Breast Fed  LATCH Score/Interventions Latch: Grasps breast easily, tongue down, lips flanged, rhythmical sucking. Intervention(s): Assist with latch;Breast massage  Audible Swallowing: Spontaneous and intermittent  Type of Nipple: Everted at rest and after stimulation (short shafted nipple, mom everts prior to latching baby, some pinching noted) Intervention(s): Hand pump;Reverse pressure  Comfort (Breast/Nipple): Soft / non-tender     Hold (Positioning): Assistance needed to correctly position infant at breast and maintain latch. Intervention(s): Breastfeeding basics reviewed;Support Pillows;Position options;Skin to skin  LATCH Score: 9  Lactation Tools Discussed/Used     Consult Status Consult Status: Complete Follow-up type: Call as needed    Tonna Corner 01/13/2015, 9:55 AM

## 2015-01-13 NOTE — Discharge Summary (Signed)
Obstetric Discharge Summary Reason for Admission: onset of labor Prenatal Procedures: none Intrapartum Procedures: cesarean: low cervical, transverse Postpartum Procedures: none Complications-Operative and Postpartum: rash from large dressing HEMOGLOBIN  Date Value Ref Range Status  01/11/2015 8.5* 12.0 - 15.0 g/dL Final    Comment:    DELTA CHECK NOTED REPEATED TO VERIFY    HCT  Date Value Ref Range Status  01/11/2015 23.9* 36.0 - 46.0 % Final    Physical Exam:  General: alert Lochia: appropriate Uterine Fundus: firm Incision: healing well   Discharge Diagnoses: Term Pregnancy-delivered and NRFHT  Discharge Information: Date: 01/13/2015 Activity: pelvic rest and no strenuous activity Diet: routine Medications: Ibuprofen and Percocet Condition: stable Instructions: refer to practice specific booklet Discharge to: home Follow-up Information    Follow up with Melina Schools, MD. Schedule an appointment as soon as possible for a visit in 2 weeks.   Specialty:  Obstetrics and Gynecology   Why:  incision check   Contact information:   [address] [place] [address] [phone]       Newborn Data: Live born female  Birth Weight: 6 lb 11.9 oz (3060 g) APGAR: 9, 9  Home with mother.  Luvena Wentling D 01/13/2015, 10:03 AM

## 2015-01-13 NOTE — Progress Notes (Signed)
POD #3 Doing well, rash at right edge of incision from large dressing Afeb, VSS Abd- soft, fundus firm, incision intact, rash with blisters on right past honeycomb dressing D/c home

## 2015-01-15 ENCOUNTER — Other Ambulatory Visit (HOSPITAL_COMMUNITY): Payer: BLUE CROSS/BLUE SHIELD

## 2015-01-16 ENCOUNTER — Encounter (HOSPITAL_COMMUNITY): Admission: RE | Payer: Self-pay | Source: Ambulatory Visit

## 2015-01-16 ENCOUNTER — Inpatient Hospital Stay (HOSPITAL_COMMUNITY)
Admission: RE | Admit: 2015-01-16 | Payer: BLUE CROSS/BLUE SHIELD | Source: Ambulatory Visit | Admitting: Obstetrics and Gynecology

## 2015-01-16 SURGERY — Surgical Case
Anesthesia: Regional

## 2015-01-18 ENCOUNTER — Other Ambulatory Visit (HOSPITAL_COMMUNITY): Payer: Self-pay

## 2015-02-22 LAB — HM PAP SMEAR: HM Pap smear: NEGATIVE

## 2015-09-20 ENCOUNTER — Ambulatory Visit: Payer: Self-pay

## 2015-09-27 ENCOUNTER — Ambulatory Visit (INDEPENDENT_AMBULATORY_CARE_PROVIDER_SITE_OTHER): Payer: BLUE CROSS/BLUE SHIELD

## 2015-09-27 DIAGNOSIS — Z23 Encounter for immunization: Secondary | ICD-10-CM

## 2015-11-12 ENCOUNTER — Encounter: Payer: Self-pay | Admitting: Family Medicine

## 2015-11-12 ENCOUNTER — Ambulatory Visit (INDEPENDENT_AMBULATORY_CARE_PROVIDER_SITE_OTHER): Payer: BLUE CROSS/BLUE SHIELD | Admitting: Family Medicine

## 2015-11-12 VITALS — BP 98/62 | HR 100 | Temp 98.2°F | Resp 16 | Wt 123.0 lb

## 2015-11-12 DIAGNOSIS — R509 Fever, unspecified: Secondary | ICD-10-CM | POA: Diagnosis not present

## 2015-11-12 DIAGNOSIS — J40 Bronchitis, not specified as acute or chronic: Secondary | ICD-10-CM

## 2015-11-12 DIAGNOSIS — J309 Allergic rhinitis, unspecified: Secondary | ICD-10-CM | POA: Insufficient documentation

## 2015-11-12 DIAGNOSIS — S060X0A Concussion without loss of consciousness, initial encounter: Secondary | ICD-10-CM | POA: Diagnosis not present

## 2015-11-12 DIAGNOSIS — F419 Anxiety disorder, unspecified: Secondary | ICD-10-CM | POA: Insufficient documentation

## 2015-11-12 DIAGNOSIS — Z331 Pregnant state, incidental: Secondary | ICD-10-CM | POA: Insufficient documentation

## 2015-11-12 DIAGNOSIS — J45909 Unspecified asthma, uncomplicated: Secondary | ICD-10-CM | POA: Insufficient documentation

## 2015-11-12 LAB — POCT INFLUENZA A/B
Influenza A, POC: NEGATIVE
Influenza B, POC: NEGATIVE

## 2015-11-12 NOTE — Progress Notes (Signed)
Patient ID: Brandi [name], female   DOB: 05-Feb-1981, 34 y.o.   MRN: [id number]    Subjective:  HPI Pt reports that she has had a cold for about a week. Then on this past Friday she started running a fever. The highest it has gotten is 101. She reports that she felt like she had something rumbling in her chest. She is coughing up clear/white mucus. She has been in bed since Friday. He father in law is a MD and gave her a Zpak to take and she has been taking that and reports that she is feeling better today, she just wanted to come and make sure she is taking the right medications and have her lungs listened to again.   She also reports that she hit her head on the ceiling and she has had a headache since. She reports that she was nauseous and Tylenol is not helping. She reports her headache woke her up in the middle of the night. She has had some dizziness. Her phone was making her head hurt worse so she stopped using her phone yesterday.    Prior to Admission medications   Medication Sig Start Date End Date Taking? Authorizing Provider  azithromycin (ZITHROMAX) 250 MG tablet  11/10/15  Yes Historical Provider, MD  ibuprofen (ADVIL,MOTRIN) 600 MG tablet Take 1 tablet (600 mg total) by mouth every 6 (six) hours. 01/13/15  Yes Cheri Fowler, MD  levothyroxine (SYNTHROID, LEVOTHROID) 75 MCG tablet Take 50 mcg by mouth daily before breakfast.    Yes Historical Provider, MD  oxyCODONE-acetaminophen (PERCOCET/ROXICET) 5-325 MG per tablet Take 1-2 tablets by mouth every 4 (four) hours as needed for severe pain. Patient not taking: Reported on 11/12/2015 01/13/15   Cheri Fowler, MD  Prenatal Vit-Fe Fumarate-FA (PRENATAL MULTIVITAMIN) TABS tablet Take 1 tablet by mouth daily at 12 noon. Reported on 11/12/2015    Historical Provider, MD    Patient Active Problem List   Diagnosis Date Noted  . Allergic rhinitis 11/12/2015  . Anxiety 11/12/2015  . 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 Normal pregnancy,  incidental 11/12/2015  . Active labor at term 01/10/2015  . S/P cesarean section 01/10/2015    Past Medical History  Diagnosis Date  . Hypothyroidism     Social History   Social History  . Marital Status: Married    Spouse Name: N/A  . Number of Children: N/A  . Years of Education: N/A   Occupational History  . Not on file.   Social History Main Topics  . Smoking status: Never Smoker   . Smokeless tobacco: Not on file  . Alcohol Use: No  . Drug Use: No  . Sexual Activity: Yes   Other Topics Concern  . Not on file   Social History Narrative    Allergies  Allergen Reactions  . Neosporin [Neomycin-Bacitracin Zn-Polymyx] Rash  . Sulfa Antibiotics Rash    Review of Systems  Constitutional: Positive for fever, chills, weight loss and malaise/fatigue.  HENT: Positive for congestion and ear pain.   Eyes: Negative.   Respiratory: Positive for cough, sputum production and wheezing.   Cardiovascular: Negative.   Gastrointestinal: Positive for nausea.  Genitourinary: Negative.   Musculoskeletal: Negative.   Skin: Negative.   Neurological: Positive for dizziness and headaches.  Endo/Heme/Allergies: Negative.   Psychiatric/Behavioral: Negative.     Immunization History  Administered Date(s) Administered  . Influenza,inj,Quad PF,36+ Mos 09/27/2015   Objective:  BP 98/62 mmHg  Pulse 100  Temp(Src) 98.2 F (36.8 C) (Oral)  Resp 16  Wt 123 lb (55.792 kg)  SpO2 100%  Physical Exam  Constitutional: She is oriented to person, place, and time and well-developed, well-nourished, and in no distress.  HENT:  Head: Normocephalic and atraumatic.  Right Ear: External ear normal.  Left Ear: External ear normal.  Nose: Nose normal.  Mouth/Throat: Oropharynx is clear and moist.  Eyes: Conjunctivae and EOM are normal. Pupils are equal, round, and reactive to light.  Neck: Normal range of motion. Neck supple.  Cardiovascular: Normal rate, regular rhythm, normal heart  sounds and intact distal pulses.   Pulmonary/Chest: Effort normal and breath sounds normal.  Abdominal: Soft. Bowel sounds are normal.  Musculoskeletal: Normal range of motion.  Neurological: She is alert and oriented to person, place, and time. She has normal reflexes. Gait normal. GCS score is 15.  Completely nonfocal exam.  Skin: Skin is warm and dry.  Psychiatric: Mood, memory, affect and judgment normal.    Lab Results  Component Value Date   WBC 9.0 01/11/2015   HGB 8.5* 01/11/2015   HCT 23.9* 01/11/2015   PLT 198 01/11/2015   GLUCOSE 82 01/10/2012    CMP     Component Value Date/Time   NA 140 01/10/2012 0532   K 3.6 01/10/2012 0532   CL 105 01/10/2012 0532   CO2 22 01/10/2012 0532   GLUCOSE 82 01/10/2012 0532   BUN 8 01/10/2012 0532   CREATININE 0.64 01/10/2012 0532   CALCIUM 8.6 01/10/2012 0532   PROT 8.1 01/10/2012 0532   ALBUMIN 4.4 01/10/2012 0532   AST 29 01/10/2012 0532   ALT 33 01/10/2012 0532   ALKPHOS 44* 01/10/2012 0532   BILITOT 0.4 01/10/2012 0532   GFRNONAA >60 01/10/2012 0532   GFRAA >60 01/10/2012 0532    Assessment and Plan :  1. Fever, unspecified fever cause  - POCT Influenza A/B- negative.  2. Bronchitis Continue zpak, Robitussin, fluids and rest. No chest x-ray needed at this time. 3. Concussion, without loss of consciousness, initial encounter Will call if needs pain medications or headache gets worse.  I have done the exam and reviewed the above chart and it is accurate to the best of my knowledge.  Patient was seen and examined by Dr. Miguel Aschoff, and noted scribed by Webb Laws, [place] MD [facility] 11/12/2015 8:59 AM

## 2015-11-20 ENCOUNTER — Telehealth: Payer: Self-pay

## 2015-11-20 NOTE — Telephone Encounter (Signed)
Left message on pt.'s vm.

## 2015-11-20 NOTE — Telephone Encounter (Signed)
She can take Delsym or Mucinex DM for cough. She should not take anything with a decongestant. O.v. If any fever or shortness of breath.

## 2015-11-20 NOTE — Telephone Encounter (Signed)
Patient calling and states that she saw Dr. Rosanna Randy a week ago and was given Zpak. She finished that but now has a dry cough, constant, chest congestion and chest pain that feels muscular from all the coughing (no cardiac symptoms), wheezing. She breast feeds and wanted to know what can she take that would be safe. She could barely talk on the phone without coughing constantly. Please review. She did not have a chest xray and flu test was negative. She also had stomach bug over the weekend from her son.

## 2015-12-04 ENCOUNTER — Telehealth: Payer: Self-pay | Admitting: Family Medicine

## 2015-12-04 ENCOUNTER — Telehealth: Payer: Self-pay

## 2015-12-04 NOTE — Telephone Encounter (Signed)
Advised patient that doctor Rosanna Randy thinks the pain is a most likely muscular pain. Patient went ahead and said yes that is what she thinks. Wants to know if she can take Ibuprofen since she is breast feeding for the inflammation on her chest. Per doctor Rosanna Randy that is ok. Offered to send over a Inhaler Prescription patient said no and that she will call tomorrow to schedule an appointment. Doctor Rosanna Randy is aware.  Thanks,  -Alyscia Carmon

## 2015-12-04 NOTE — Telephone Encounter (Signed)
Patient is calling with chest discomfort. Patient states the week of Christmas had a viral infection and after that the week of New Years patient started to have the chest discomfort, so per patient it has been for 2 weeks on and off. It feels sore and aching sometimes. Per patient the soreness of her chest radiates under her left arm. It feels aching when patient breath and walks.. No SOB, neck pain, back pain,cold sweats or lightheadedness. Patient does feels weakness on her arms. CB# [phone]  Thanks,  -Muscab Brenneman

## 2015-12-06 ENCOUNTER — Ambulatory Visit: Payer: BLUE CROSS/BLUE SHIELD | Admitting: Family Medicine

## 2016-02-22 ENCOUNTER — Telehealth: Payer: Self-pay

## 2016-02-22 ENCOUNTER — Ambulatory Visit (INDEPENDENT_AMBULATORY_CARE_PROVIDER_SITE_OTHER): Payer: BLUE CROSS/BLUE SHIELD | Admitting: Physician Assistant

## 2016-02-22 ENCOUNTER — Encounter: Payer: Self-pay | Admitting: Physician Assistant

## 2016-02-22 VITALS — BP 110/70 | HR 74 | Temp 98.3°F | Resp 16 | Ht 65.0 in | Wt 122.0 lb

## 2016-02-22 DIAGNOSIS — R519 Headache, unspecified: Secondary | ICD-10-CM

## 2016-02-22 DIAGNOSIS — R51 Headache: Secondary | ICD-10-CM | POA: Diagnosis not present

## 2016-02-22 NOTE — Patient Instructions (Signed)
Sinus Headache A sinus headache occurs when the paranasal sinuses become clogged or swollen. Paranasal sinuses are air pockets within the bones of the face. Sinus headaches can range from mild to severe. CAUSES A sinus headache can result from various conditions that affect the sinuses, such as:  Colds.  Sinus infections.  Allergies. SYMPTOMS The main symptom of this condition is a headache that may feel like pain or pressure in the face, forehead, ears, or upper teeth. People who have a sinus headache often have other symptoms, such as:  Congested or runny nose.  Fever.  Inability to smell. Weather changes can make symptoms worse. DIAGNOSIS This condition may be diagnosed based on:  A physical exam and medical history.  Imaging tests, such as a CT scan and MRI, to check for problems with the sinuses.  A specialist may look into the sinuses with a tool that has a camera (endoscopy). TREATMENT Treatment for this condition depends on the cause.  Sinus pain that is caused by a sinus infection may be treated with antibiotic medicine.  Sinus pain that is caused by allergies may be helped by allergy medicines (antihistamines) and medicated nasal sprays.  Sinus pain that is caused by congestion may be helped by flushing the nose and sinuses with saline solution. HOME CARE INSTRUCTIONS  Take medicines only as directed by your health care provider.  If you were prescribed an antibiotic medicine, finish all of it even if you start to feel better.  If you have congestion, use a nasal spray to help reduce pressure.  If directed, apply a warm, moist washcloth to your face to help relieve pain. SEEK MEDICAL CARE IF:  You have headaches more than one time each week.  You have sensitivity to light or sound.  You have a fever.  You feel sick to your stomach (nauseous) or you throw up (vomit).  Your headaches do not get better with treatment. Many people think that they have a  sinus headache when they actually have migraines or tension headaches. SEEK IMMEDIATE MEDICAL CARE IF:  You have vision problems.  You have sudden, severe pain in your face or head.  You have a seizure.  You are confused.  You have a stiff neck.   This information is not intended to replace advice given to you by your health care provider. Make sure you discuss any questions you have with your health care provider.   Document Released: 12/18/2004 Document Revised: 03/27/2015 Document Reviewed: 11/06/2014 Elsevier Interactive Patient Education 2016 [employer].  Migraine Headache A migraine headache is an intense, throbbing pain on one or both sides of your head. A migraine can last for 30 minutes to several hours. CAUSES  The exact cause of a migraine headache is not always known. However, a migraine may be caused when nerves in the brain become irritated and release chemicals that cause inflammation. This causes pain. Certain things may also trigger migraines, such as:  Alcohol.  Smoking.  Stress.  Menstruation.  Aged cheeses.  Foods or drinks that contain nitrates, glutamate, aspartame, or tyramine.  Lack of sleep.  Chocolate.  Caffeine.  Hunger.  Physical exertion.  Fatigue.  Medicines used to treat chest pain (nitroglycerine), birth control pills, estrogen, and some blood pressure medicines. SIGNS AND SYMPTOMS  Pain on one or both sides of your head.  Pulsating or throbbing pain.  Severe pain that prevents daily activities.  Pain that is aggravated by any physical activity.  Nausea, vomiting, or both.  Dizziness.  Pain with exposure to bright lights, loud noises, or activity.  General sensitivity to bright lights, loud noises, or smells. Before you get a migraine, you may get warning signs that a migraine is coming (aura). An aura may include:  Seeing flashing lights.  Seeing bright spots, halos, or zigzag lines.  Having tunnel vision or  blurred vision.  Having feelings of numbness or tingling.  Having trouble talking.  Having muscle weakness. DIAGNOSIS  A migraine headache is often diagnosed based on:  Symptoms.  Physical exam.  A CT scan or MRI of your head. These imaging tests cannot diagnose migraines, but they can help rule out other causes of headaches. TREATMENT Medicines may be given for pain and nausea. Medicines can also be given to help prevent recurrent migraines.  HOME CARE INSTRUCTIONS  Only take over-the-counter or prescription medicines for pain or discomfort as directed by your health care provider. The use of long-term narcotics is not recommended.  Lie down in a dark, quiet room when you have a migraine.  Keep a journal to find out what may trigger your migraine headaches. For example, write down:  What you eat and drink.  How much sleep you get.  Any change to your diet or medicines.  Limit alcohol consumption.  Quit smoking if you smoke.  Get 7-9 hours of sleep, or as recommended by your health care provider.  Limit stress.  Keep lights dim if bright lights bother you and make your migraines worse. SEEK IMMEDIATE MEDICAL CARE IF:   Your migraine becomes severe.  You have a fever.  You have a stiff neck.  You have vision loss.  You have muscular weakness or loss of muscle control.  You start losing your balance or have trouble walking.  You feel faint or pass out.  You have severe symptoms that are different from your first symptoms. MAKE SURE YOU:   Understand these instructions.  Will watch your condition.  Will get help right away if you are not doing well or get worse.   This information is not intended to replace advice given to you by your health care provider. Make sure you discuss any questions you have with your health care provider.   Document Released: 11/10/2005 Document Revised: 12/01/2014 Document Reviewed: 07/18/2013 Elsevier Interactive Patient  Education [employer].

## 2016-02-22 NOTE — Progress Notes (Signed)
Patient ID: Janaki Sisler, female   DOB: June 13, 1981, 35 y.o.   MRN: [id number]       Patient: Halynn Polinsky Female    DOB: 1981-01-29   35 y.o.   MRN: [id number] Visit Date: 02/22/2016  Today's Provider: Mar Daring, PA-C   Chief Complaint  Patient presents with  . Migraine   Subjective:    HPI Headache: Patient complains of headache. She does have a headache at this time. Patient reports headache has been present for 2 weeks. Patient reports taking Ibuprofen and Tylenol, reports mild improvement. Patient reports dizziness, blurred vision and nausea. Patient reports pain is mostly on the top of head and some in the middle of forehead. Patient reports taking Zyrtec today and see if maybe her symptoms are allergy related. She has also not been sleeping well recently because her daughter had the flu last week and has had trouble sleeping since then.     Allergies  Allergen Reactions  . Neosporin [Neomycin-Bacitracin Zn-Polymyx] Rash  . Sulfa Antibiotics Rash   Previous Medications   AZITHROMYCIN (ZITHROMAX) 250 MG TABLET       IBUPROFEN (ADVIL,MOTRIN) 600 MG TABLET    Take 1 tablet (600 mg total) by mouth every 6 (six) hours.   LEVOTHYROXINE (SYNTHROID, LEVOTHROID) 75 MCG TABLET    Take 50 mcg by mouth daily before breakfast.    OXYCODONE-ACETAMINOPHEN (PERCOCET/ROXICET) 5-325 MG PER TABLET    Take 1-2 tablets by mouth every 4 (four) hours as needed for severe pain.   PRENATAL VIT-FE FUMARATE-FA (PRENATAL MULTIVITAMIN) TABS TABLET    Take 1 tablet by mouth daily at 12 noon. Reported on 11/12/2015    Review of Systems  Constitutional: Negative.   HENT: Positive for sinus pressure. Negative for ear pain, sneezing and tinnitus.   Eyes: Positive for visual disturbance.  Respiratory: Negative.   Cardiovascular: Negative.   Gastrointestinal: Positive for nausea. Negative for vomiting and abdominal pain.  Neurological: Positive for dizziness and headaches. Negative for syncope,  weakness and light-headedness.    Social History  Substance Use Topics  . Smoking status: Never Smoker   . Smokeless tobacco: Not on file  . Alcohol Use: No   Objective:   There were no vitals taken for this visit.  Physical Exam  Constitutional: She is oriented to person, place, and time. She appears well-developed and well-nourished. No distress.  HENT:  Head: Normocephalic and atraumatic.  Right Ear: Hearing, external ear and ear canal normal. Tympanic membrane is bulging. Tympanic membrane is not erythematous. A middle ear effusion is present.  Left Ear: Hearing, external ear and ear canal normal. Tympanic membrane is bulging. Tympanic membrane is not erythematous. A middle ear effusion is present.  Nose: Nose normal. Right sinus exhibits no maxillary sinus tenderness and no frontal sinus tenderness. Left sinus exhibits no maxillary sinus tenderness and no frontal sinus tenderness.  Mouth/Throat: Uvula is midline, oropharynx is clear and moist and mucous membranes are normal. No oropharyngeal exudate, posterior oropharyngeal edema or posterior oropharyngeal erythema.  Tenderness over ethmoid sinuses  Eyes: Conjunctivae and EOM are normal. Pupils are equal, round, and reactive to light. Right eye exhibits no discharge. Left eye exhibits no discharge. No scleral icterus. Right eye exhibits no nystagmus. Left eye exhibits no nystagmus.  Neck: Normal range of motion. Neck supple. No tracheal deviation present. No thyromegaly present.  Cardiovascular: Normal rate, regular rhythm and normal heart sounds.  Exam reveals no gallop and no friction rub.   No murmur heard. Pulmonary/Chest:  Effort normal and breath sounds normal. No stridor. No respiratory distress. She has no wheezes. She has no rales.  Lymphadenopathy:    She has no cervical adenopathy.  Neurological: She is alert and oriented to person, place, and time. She has normal strength. No cranial nerve deficit or sensory deficit. She  displays a negative Romberg sign. Coordination and gait normal.  Skin: Skin is warm and dry. She is not diaphoretic.  Vitals reviewed.       Assessment & Plan:     1. Acute intractable headache, unspecified headache type Offered patient phenergan and toradol injections but she refused as she is still breast feeding and does not pump so she would not be able to dump breast milk. She does have fluid behind both TM with slight bulge but no erythematous. She also has never had seasonal allergies but felt this may be the cause of the headache initially as she did not have any auras at that time. She is going to try zyrtec, sudafed and tylenol/IBU through the weekend as well as trying to get some rest. If headache worsens I advised her of our Saturday clinic or to go to the ER if after hours. If headache does not worsen but persist until Monday I advised her to come back to see me on Monday.  She agrees.        Mar Daring, PA-C  [facility]

## 2016-02-22 NOTE — Telephone Encounter (Signed)
Patient reports that he has had a headache intermittently for the last 2 weeks. She thought it could be possible allergies. However, yesterday she experienced blurred vision, and seeing a "halo" effect through her eyes. Patient reports that she suffers from migraine headaches, but she has not had one in several years. She reports that her mother also has headaches. She reports that she thinks looking at the computer screen too long yesterday triggered the headache that she currently has had for the last 2 days. Patient has been taking OTC Ibuprofen and Zyrtec to help with symptoms. Patient is also experiencing nausea with some dizziness. Denies vomiting. Scheduled patient to be seen in the office today by Tawanna Sat.

## 2016-03-20 ENCOUNTER — Other Ambulatory Visit: Payer: Self-pay | Admitting: Family Medicine

## 2016-04-15 DIAGNOSIS — Z01419 Encounter for gynecological examination (general) (routine) without abnormal findings: Secondary | ICD-10-CM | POA: Diagnosis not present

## 2016-04-15 DIAGNOSIS — Z681 Body mass index (BMI) 19 or less, adult: Secondary | ICD-10-CM | POA: Diagnosis not present

## 2016-04-15 DIAGNOSIS — Z1389 Encounter for screening for other disorder: Secondary | ICD-10-CM | POA: Diagnosis not present

## 2016-04-29 ENCOUNTER — Telehealth: Payer: Self-pay | Admitting: Family Medicine

## 2016-04-29 NOTE — Telephone Encounter (Signed)
Happy to see but if ONLY symptom is blurred vision ok to start with eye doctor.

## 2016-04-29 NOTE — Telephone Encounter (Signed)
Pt called saying she has been having optical migraines.  Focus blurry.  Not sure if it is allergies or something related to her eyes.  She wants to know  should she see an optometrist or come in here for a visit.  Please advise [phone]  Thanks Con Memos

## 2016-04-29 NOTE — Telephone Encounter (Signed)
Please review-aa 

## 2016-04-30 NOTE — Telephone Encounter (Signed)
Pt advised, she would like to see [place] to discuss headaches she has had and getting anxiety from worrying about this and what it can be, she could not come in today due to needing to find babysitter but appointment made for tomorrow June 8th, patient was advised if her symptoms get worse to let us know-aa

## 2016-05-01 ENCOUNTER — Encounter: Payer: Self-pay | Admitting: Family Medicine

## 2016-05-01 ENCOUNTER — Ambulatory Visit (INDEPENDENT_AMBULATORY_CARE_PROVIDER_SITE_OTHER): Payer: BLUE CROSS/BLUE SHIELD | Admitting: Family Medicine

## 2016-05-01 VITALS — BP 128/72 | HR 80 | Temp 98.3°F | Resp 16 | Wt 120.0 lb

## 2016-05-01 DIAGNOSIS — J019 Acute sinusitis, unspecified: Secondary | ICD-10-CM

## 2016-05-01 DIAGNOSIS — H539 Unspecified visual disturbance: Secondary | ICD-10-CM | POA: Diagnosis not present

## 2016-05-01 DIAGNOSIS — J302 Other seasonal allergic rhinitis: Secondary | ICD-10-CM

## 2016-05-01 MED ORDER — LORATADINE 10 MG PO TABS
10.0000 mg | ORAL_TABLET | Freq: Every day | ORAL | Status: DC
Start: 2016-05-01 — End: 2016-10-31

## 2016-05-01 MED ORDER — FLUTICASONE PROPIONATE 50 MCG/ACT NA SUSP
2.0000 | Freq: Every day | NASAL | Status: DC
Start: 2016-05-01 — End: 2016-10-31

## 2016-05-01 NOTE — Progress Notes (Signed)
Patient ID: Brandi [name], female   DOB: 12-22-1980, 35 y.o.   MRN: [id number]   Kharlie Farrant  MRN: [id number] DOB: 10-Oct-1981  Subjective:  HPI   The patient is a 35 year old female who presents for re-evaluation and follow up of headaches  She was seen in March by the PA in the office.  At that time she was told to try Zyrtec, Sudafed and Tylenol.  She reports that she has been taking Phenylephrine and it has not helped.  She did not want to use anything else as she was still breast feeding.  Since is no longer breast feeding but when she started to wean her baby off the the breast feeding she began having occular migraine.  She has since had 4.  She had not had a migraine in about 5 years until now.    Patient Active Problem List   Diagnosis Date Noted  . Allergic rhinitis 11/12/2015  . Anxiety 11/12/2015  . 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 Normal pregnancy, incidental 11/12/2015  . Active labor at term 01/10/2015  . S/P cesarean section 01/10/2015    Past Medical History  Diagnosis Date  . Hypothyroidism     Social History   Social History  . Marital Status: Married    Spouse Name: N/A  . Number of Children: N/A  . Years of Education: N/A   Occupational History  . Not on file.   Social History Main Topics  . Smoking status: Never Smoker   . Smokeless tobacco: Never Used  . Alcohol Use: No  . Drug Use: No  . Sexual Activity: Yes   Other Topics Concern  . Not on file   Social History Narrative    Outpatient Prescriptions Prior to Visit  Medication Sig Dispense Refill  . ibuprofen (ADVIL,MOTRIN) 600 MG tablet Take 1 tablet (600 mg total) by mouth every 6 (six) hours. 30 tablet 0  . levothyroxine (SYNTHROID, LEVOTHROID) 50 MCG tablet TAKE 1 TABLET BY MOUTH EVERY DAY 30 tablet 6  . Prenatal Vit-Fe Fumarate-FA (PRENATAL MULTIVITAMIN) TABS tablet Take 1 tablet by mouth daily at 12 noon. Reported on 02/22/2016    . levothyroxine (SYNTHROID, LEVOTHROID) 75 MCG tablet  Take 50 mcg by mouth daily before breakfast.      No facility-administered medications prior to visit.    Allergies  Allergen Reactions  . Neosporin [Neomycin-Bacitracin Zn-Polymyx] Rash  . Sulfa Antibiotics Rash    Review of Systems  Constitutional: Negative for fever and malaise/fatigue.  HENT: Positive for ear pain (Ear fullness and popping) and tinnitus. Negative for congestion, ear discharge, hearing loss, nosebleeds and sore throat.   Eyes: Positive for blurred vision (mostly when she had the headaches but a little now without the headache), photophobia and pain (She feels she is straining her eyes and feels pressure and pain behind the eyes.). Negative for double vision, discharge and redness.  Respiratory: Negative for cough, shortness of breath, wheezing and stridor.   Cardiovascular: Negative for chest pain, palpitations, orthopnea, claudication, leg swelling and PND.  Gastrointestinal: Positive for nausea (With the headaches and sometimes when she moves from one room to another and the lighting is differenct.).  Neurological: Positive for headaches. Negative for weakness.   Objective:  BP 128/72 mmHg  Pulse 80  Temp(Src) 98.3 F (36.8 C) (Oral)  Resp 16  Wt 120 lb (54.432 kg)  Physical Exam  Constitutional: She is oriented to person, place, and time and well-developed, well-nourished, and in no distress.  HENT:  Head: Normocephalic and atraumatic.  Right Ear: External ear normal.  Left Ear: External ear normal.  Nose: Nose normal.  Mouth/Throat: Oropharynx is clear and moist.  Eyes: Conjunctivae and EOM are normal. Pupils are equal, round, and reactive to light.  Neck: Normal range of motion. Neck supple.  Cardiovascular: Normal rate, regular rhythm and normal heart sounds.   Pulmonary/Chest: Effort normal and breath sounds normal.  Neurological: She is alert and oriented to person, place, and time. Gait normal. GCS score is 15.  Grossly nonfocal neurologic exam.    Skin: Skin is warm and dry.  Psychiatric: Mood, memory, affect and judgment normal.    Assessment and Plan :  1. Visual changes  - Ambulatory referral to Ophthalmology  2. Acute sinusitis, recurrence not specified, unspecified location  - loratadine (CLARITIN) 10 MG tablet; Take 1 tablet (10 mg total) by mouth daily.  Dispense: 30 tablet; Refill: 11 - fluticasone (FLONASE) 50 MCG/ACT nasal spray; Place 2 sprays into both nostrils daily.  Dispense: 16 g; Refill: 6  3. Seasonal allergies  - loratadine (CLARITIN) 10 MG tablet; Take 1 tablet (10 mg total) by mouth daily.  Dispense: 30 tablet; Refill: 11 - fluticasone (FLONASE) 50 MCG/ACT nasal spray; Place 2 sprays into both nostrils daily.  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 5. Anxiety disorder    Miguel Aschoff MD [facility] 05/01/2016 9:42 AM

## 2016-05-06 ENCOUNTER — Telehealth: Payer: Self-pay | Admitting: Family Medicine

## 2016-05-06 DIAGNOSIS — D485 Neoplasm of uncertain behavior of skin: Secondary | ICD-10-CM | POA: Diagnosis not present

## 2016-05-06 DIAGNOSIS — D225 Melanocytic nevi of trunk: Secondary | ICD-10-CM | POA: Diagnosis not present

## 2016-05-06 MED ORDER — MONTELUKAST SODIUM 10 MG PO TABS: 10.0000 mg | ORAL_TABLET | Freq: Every day | ORAL | Status: DC

## 2016-05-06 NOTE — Telephone Encounter (Signed)
Add Monteleukast 10mg  daily

## 2016-05-06 NOTE — Telephone Encounter (Signed)
Pt is wanting to know how long should it take until loratadine (CLARITIN) 10 MG tablet kicks in.  She is still having sinus and head pressure.

## 2016-05-06 NOTE — Telephone Encounter (Signed)
Please review-aa 

## 2016-05-06 NOTE — Telephone Encounter (Signed)
Pt advised-aa 

## 2016-05-08 ENCOUNTER — Encounter: Payer: Self-pay | Admitting: Family Medicine

## 2016-05-08 ENCOUNTER — Ambulatory Visit (INDEPENDENT_AMBULATORY_CARE_PROVIDER_SITE_OTHER): Payer: BLUE CROSS/BLUE SHIELD | Admitting: Family Medicine

## 2016-05-08 VITALS — BP 102/68 | HR 78 | Temp 97.6°F | Resp 16 | Wt 120.0 lb

## 2016-05-08 DIAGNOSIS — R51 Headache: Secondary | ICD-10-CM | POA: Diagnosis not present

## 2016-05-08 DIAGNOSIS — F419 Anxiety disorder, unspecified: Secondary | ICD-10-CM | POA: Diagnosis not present

## 2016-05-08 DIAGNOSIS — J329 Chronic sinusitis, unspecified: Secondary | ICD-10-CM | POA: Diagnosis not present

## 2016-05-08 DIAGNOSIS — H539 Unspecified visual disturbance: Secondary | ICD-10-CM

## 2016-05-08 DIAGNOSIS — J302 Other seasonal allergic rhinitis: Secondary | ICD-10-CM | POA: Diagnosis not present

## 2016-05-08 DIAGNOSIS — J309 Allergic rhinitis, unspecified: Secondary | ICD-10-CM

## 2016-05-08 DIAGNOSIS — R519 Headache, unspecified: Secondary | ICD-10-CM

## 2016-05-08 MED ORDER — AMOXICILLIN 500 MG PO CAPS: 500.0000 mg | ORAL_CAPSULE | Freq: Three times a day (TID) | ORAL | Status: DC

## 2016-05-08 MED ORDER — SERTRALINE HCL 50 MG PO TABS: 50.0000 mg | ORAL_TABLET | Freq: Every day | ORAL | Status: DC

## 2016-05-08 NOTE — Progress Notes (Signed)
Patient ID: Brandi [name], female   DOB: [birth year], 35 y.o.   MRN: [id number]    Subjective:  HPI Pt is here today for sinus pressure and headache. She reports that she was seen about a week ago for allergies, headache and blurred vision. She was referred to ophthalmology but they could not get her in until July, she was started on Claritin and Flonase. She called back because that was not helping and started Singulair yesterday. She reports that has not helped yet. Pt reports that she has developed some congestion in her head along with the headache and pressure. She has also started feeling some dizziness and her ears still feel full. She reports that she got a little nervous about all these symptoms and decided to come back in for this. She would also want to talk about her anxiety.   Prior to Admission medications   Medication Sig Start Date End Date Taking? Authorizing Provider  fluticasone (FLONASE) 50 MCG/ACT nasal spray Place 2 sprays into both nostrils daily. 05/01/16  Yes Richard Maceo Pro., MD  ibuprofen (ADVIL,MOTRIN) 600 MG tablet Take 1 tablet (600 mg total) by mouth every 6 (six) hours. 01/13/15  Yes Cheri Fowler, MD  levothyroxine (SYNTHROID, LEVOTHROID) 50 MCG tablet TAKE 1 TABLET BY MOUTH EVERY DAY 03/20/16  Yes Richard Maceo Pro., MD  loratadine (CLARITIN) 10 MG tablet Take 1 tablet (10 mg total) by mouth daily. 05/01/16  Yes Richard Maceo Pro., MD  montelukast (SINGULAIR) 10 MG tablet Take 1 tablet (10 mg total) by mouth at bedtime. 05/06/16  Yes Richard Maceo Pro., MD  Prenatal Vit-Fe Fumarate-FA (PRENATAL MULTIVITAMIN) TABS tablet Take 1 tablet by mouth daily at 12 noon. Reported on 05/08/2016    Historical Provider, MD    Patient Active Problem List   Diagnosis Date Noted  . Allergic rhinitis 11/12/2015  . Anxiety 11/12/2015  . 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 Normal pregnancy, incidental 11/12/2015  . Active labor at term 01/10/2015  . S/P cesarean section  01/10/2015    Past Medical History  Diagnosis Date  . Hypothyroidism     Social History   Social History  . Marital Status: Married    Spouse Name: N/A  . Number of Children: N/A  . Years of Education: N/A   Occupational History  . Not on file.   Social History Main Topics  . Smoking status: Never Smoker   . Smokeless tobacco: Never Used  . Alcohol Use: No  . Drug Use: No  . Sexual Activity: Yes   Other Topics Concern  . Not on file   Social History Narrative    Allergies  Allergen Reactions  . Neosporin [Neomycin-Bacitracin Zn-Polymyx] Rash  . Sulfa Antibiotics Rash    Review of Systems  Constitutional: Negative.   HENT: Positive for congestion.        Ears feel stopped up.  Eyes: Negative.   Respiratory: Negative.   Cardiovascular: Negative.   Gastrointestinal: Negative.   Genitourinary: Negative.   Musculoskeletal: Negative.   Skin: Negative.   Neurological: Positive for dizziness and headaches.  Endo/Heme/Allergies: Negative.   Psychiatric/Behavioral: The patient is nervous/anxious.     Immunization History  Administered Date(s) Administered  . Influenza,inj,Quad PF,36+ Mos 09/27/2015   Objective:  BP 102/68 mmHg  Pulse 78  Temp(Src) 97.6 F (36.4 C) (Oral)  Resp 16  Wt 120 lb (54.432 kg)  SpO2 100%  Physical Exam  Constitutional: She is oriented to person, place, and time and  well-developed, well-nourished, and in no distress.  HENT:  Head: Normocephalic and atraumatic.  Right Ear: External ear normal.  Left Ear: External ear normal.  Nose: Nose normal.  Mouth/Throat: Oropharynx is clear and moist.  Eyes: Conjunctivae and EOM are normal. Pupils are equal, round, and reactive to light.  Neck: Normal range of motion. Neck supple.  Cardiovascular: Normal rate, regular rhythm, normal heart sounds and intact distal pulses.   Pulmonary/Chest: Effort normal and breath sounds normal.  Neurological: She is alert and oriented to person, place,  and time. No cranial nerve deficit. She exhibits normal muscle tone. Gait normal. GCS score is 15.  Visual fields intact.  Skin: Skin is warm and dry.  Psychiatric: Memory and judgment normal.  Pt tearful during visit.     Lab Results  Component Value Date   WBC 9.0 01/11/2015   HGB 8.5* 01/11/2015   HCT 23.9* 01/11/2015   PLT 198 01/11/2015   GLUCOSE 82 01/10/2012   TSH 3.64 01/10/2012    CMP     Component Value Date/Time   NA 140 01/10/2012 0532   K 3.6 01/10/2012 0532   CL 105 01/10/2012 0532   CO2 22 01/10/2012 0532   GLUCOSE 82 01/10/2012 0532   BUN 8 01/10/2012 0532   CREATININE 0.64 01/10/2012 0532   CALCIUM 8.6 01/10/2012 0532   PROT 8.1 01/10/2012 0532   ALBUMIN 4.4 01/10/2012 0532   AST 29 01/10/2012 0532   ALT 33 01/10/2012 0532   ALKPHOS 44* 01/10/2012 0532   BILITOT 0.4 01/10/2012 0532   GFRNONAA >60 01/10/2012 0532   GFRAA >60 01/10/2012 0532    Assessment and Plan :  1. Allergic rhinitis, unspecified allergic rhinitis type  - Ambulatory referral to ENT  2. Seasonal allergies   3. Anxiety Follow up 2-3 weeks. This is a major issue for this patient as she has 2 children at home. Her husband is with her today and is very supportive. We discussed using when necessary alprazolam but patient and her husband are wary of something that might make her sleepy when she needs to take care of her children. The Zoloft actually helped her several years ago and I think it can be of great benefit of this time in her life. She will probably need to take it for the next 10-20 years. I am very comfortable with sending her for counseling if she needs it. I do think the Zoloft will help her. - sertraline (ZOLOFT) 50 MG tablet; Take 1 tablet (50 mg total) by mouth daily.  Dispense: 30 tablet; Refill: 12  4. Headache, unspecified headache type  Presentation is consistent with history of migraine headaches. CT is arrange an MRI may be needed for further evaluation. May also  need neurology consultation. The CT specifically is for severe headache this past few days on the head. This is resolved but patient states this was different than previous headaches. Rule out bleed although unlikely. - CT HEAD WO CONTRAST; Future  5. Chronic sinusitis, unspecified location Refer to ENT at this time. - CT HEAD WO CONTRAST; Future - Ambulatory referral to ENT - amoxicillin (AMOXIL) 500 MG capsule; Take 1 capsule (500 mg total) by mouth 3 (three) times daily.  Dispense: 30 capsule; Refill: 0  6. Visual changes May need brain MRI if the symptoms persist. - CT HEAD WO CONTRAST; Future   Patient was seen and examined by Dr. Miguel Aschoff, and noted scribed by Webb Laws, [place]  [facility] 05/08/2016 3:32 PM

## 2016-05-09 ENCOUNTER — Telehealth: Payer: Self-pay

## 2016-05-09 DIAGNOSIS — F419 Anxiety disorder, unspecified: Secondary | ICD-10-CM

## 2016-05-09 MED ORDER — ALPRAZOLAM 0.25 MG PO TABS: 0.2500 mg | ORAL_TABLET | Freq: Two times a day (BID) | ORAL | Status: DC | PRN

## 2016-05-09 NOTE — Telephone Encounter (Signed)
patient states she was seen yesterday 05/08/16 and saw Dr .Rosanna Randy and discussed trying Xanax as needed. She declined it then but has felt very anxious and panicky and would like to try a mild dose preferably to be sent in today so she can relax over the weekend with anxiety she is having. Please review, it was mentioned in the notes, -aa

## 2016-05-09 NOTE — Telephone Encounter (Signed)
Yes that is fine. Please call in alprazolam 0.25mg  1/2-1 tab PO BID prn anxiety #30 NR. She can discuss with Dr. Rosanna Randy at her 1-2 week f/u. Hope it helps.

## 2016-05-09 NOTE — Telephone Encounter (Signed)
RX called in and pt advised-aa 

## 2016-05-12 ENCOUNTER — Telehealth: Payer: Self-pay | Admitting: Family Medicine

## 2016-05-12 NOTE — Telephone Encounter (Signed)
Spoke with pt. She reports that she could not eat much over the weekend when she started the doxy. He father in law, which is doctor told her to stop the Singulair and the antibiotic and see if it gets better. She was also feeling extremely anxious about how she is feeling and the mediations when I was talking to her. She did not want to take a xanax, I advised her that it was ok to take a half to see if that made her feel a little better with making her too Sleepy. She wanted to know if she should try the antibiotic again once she can eat more or what she should do. She has her CT on Thursday unless they have a cancellation.    Spoke with Dr. Rosanna Randy. He thinks it is related to the Doxy. She can wait until she can eat more and try the doxy again or we can call her in amoxil. She wanted to wait and see if she could take the Doxy again and may call back if she wants the Amoxil.

## 2016-05-12 NOTE — Telephone Encounter (Signed)
Pt thinks she is having some reactions, headaches, nausea from the medications that you gave her this past week, Thursday.  She stopped taking the antibiotic and the other allergy medications.  She is still taking the sertraline (ZOLOFT) 50 MG tablet 05/08/16  Please advise.  [phone]  Thanks, Con Memos-

## 2016-05-15 ENCOUNTER — Telehealth: Payer: Self-pay | Admitting: Family Medicine

## 2016-05-15 ENCOUNTER — Ambulatory Visit: Payer: BLUE CROSS/BLUE SHIELD

## 2016-05-15 ENCOUNTER — Ambulatory Visit: Payer: BLUE CROSS/BLUE SHIELD | Admitting: Family Medicine

## 2016-05-15 ENCOUNTER — Ambulatory Visit
Admission: RE | Admit: 2016-05-15 | Discharge: 2016-05-15 | Disposition: A | Discharge status: Home / Self Care | Payer: BLUE CROSS/BLUE SHIELD | Source: Ambulatory Visit | Attending: Family Medicine | Admitting: Family Medicine

## 2016-05-15 DIAGNOSIS — R51 Headache: Secondary | ICD-10-CM | POA: Insufficient documentation

## 2016-05-15 DIAGNOSIS — J329 Chronic sinusitis, unspecified: Secondary | ICD-10-CM | POA: Insufficient documentation

## 2016-05-15 DIAGNOSIS — H539 Unspecified visual disturbance: Secondary | ICD-10-CM | POA: Insufficient documentation

## 2016-05-15 DIAGNOSIS — R519 Headache, unspecified: Secondary | ICD-10-CM

## 2016-05-15 MED ORDER — SUMATRIPTAN SUCCINATE 50 MG PO TABS: 50.0000 mg | ORAL_TABLET | ORAL | Status: DC | PRN

## 2016-05-15 NOTE — Telephone Encounter (Signed)
Let's try alprazolam 0.25 mg, 1/2-1 every 6 hours when necessary, #55, 5 refills. That is for anxiety. For the headache try sumatriptan 50 mg daily when necessary headache #9, 5 refills.

## 2016-05-15 NOTE — Telephone Encounter (Signed)
Pt already on Xanax, need to let pt know about the headache medication only-aa

## 2016-05-15 NOTE — Telephone Encounter (Signed)
Please review-aa 

## 2016-05-15 NOTE — Telephone Encounter (Signed)
Pt called still having headaches and nausea, dizziness.  She has her CT scan done today but just wanted you to know she is not feeling any better, actually worse than last week when she was in.  Her call back is [phone]  Thanks , Con Memos

## 2016-05-15 NOTE — Telephone Encounter (Signed)
Pt informed and medication sent. Pt had CT this morning and would like results when they are available.

## 2016-05-20 ENCOUNTER — Ambulatory Visit: Payer: BLUE CROSS/BLUE SHIELD

## 2016-05-20 DIAGNOSIS — N898 Other specified noninflammatory disorders of vagina: Secondary | ICD-10-CM | POA: Diagnosis not present

## 2016-05-20 DIAGNOSIS — F419 Anxiety disorder, unspecified: Secondary | ICD-10-CM | POA: Diagnosis not present

## 2016-05-21 ENCOUNTER — Ambulatory Visit (INDEPENDENT_AMBULATORY_CARE_PROVIDER_SITE_OTHER): Payer: BLUE CROSS/BLUE SHIELD | Admitting: Family Medicine

## 2016-05-21 ENCOUNTER — Encounter: Payer: Self-pay | Admitting: Family Medicine

## 2016-05-21 VITALS — BP 98/78 | Temp 98.7°F | Resp 16 | Wt 119.0 lb

## 2016-05-21 DIAGNOSIS — F419 Anxiety disorder, unspecified: Secondary | ICD-10-CM

## 2016-05-21 DIAGNOSIS — J309 Allergic rhinitis, unspecified: Secondary | ICD-10-CM | POA: Diagnosis not present

## 2016-05-21 NOTE — Progress Notes (Signed)
Patient: Brandi [name] Female    DOB: 07-22-1981   35 y.o.   MRN: [id number] Visit Date: 05/21/2016  Today's Provider: Wilhemena Durie, MD   Chief Complaint  Patient presents with  . Anxiety    2 week follow up    Subjective:    HPI  Patient is here for a follow up on anxiety. Patient was seen 2 weeks ago and was started on Zoloft 50mg  daily. Patient reports that her symptoms have improved since starting medication.   Patient also mentions that she is still on the Amoxil that was prescribed for sinusitis. She reports that her symptoms have improved for this issue as well.      Allergies  Allergen Reactions  . Neosporin [Neomycin-Bacitracin Zn-Polymyx] Rash  . Sulfa Antibiotics Rash   Current Meds  Medication Sig  . ALPRAZolam (XANAX) 0.25 MG tablet Take 1 tablet (0.25 mg total) by mouth 2 (two) times daily as needed for anxiety.  Marland Kitchen amoxicillin (AMOXIL) 500 MG capsule Take 1 capsule (500 mg total) by mouth 3 (three) times daily.  Marland Kitchen ibuprofen (ADVIL,MOTRIN) 600 MG tablet Take 1 tablet (600 mg total) by mouth every 6 (six) hours.  Marland Kitchen levothyroxine (SYNTHROID, LEVOTHROID) 50 MCG tablet TAKE 1 TABLET BY MOUTH EVERY DAY  . Prenatal Vit-Fe Fumarate-FA (PRENATAL MULTIVITAMIN) TABS tablet Take 1 tablet by mouth daily at 12 noon. Reported on 05/08/2016  . sertraline (ZOLOFT) 50 MG tablet Take 1 tablet (50 mg total) by mouth daily.  . SUMAtriptan (IMITREX) 50 MG tablet Take 1 tablet (50 mg total) by mouth every 2 (two) hours as needed for migraine. May repeat in 2 hours if headache persists or recurs.    Review of Systems  Constitutional: Negative.   Musculoskeletal: Negative.   Neurological: Negative.   Psychiatric/Behavioral: Negative.     Social History  Substance Use Topics  . Smoking status: Never Smoker   . Smokeless tobacco: Never Used  . Alcohol Use: No   Objective:   BP 98/78 mmHg  Temp(Src) 98.7 F (37.1 C)  Resp 16  Wt 119 lb (53.978 kg)  LMP  05/09/2016  Physical Exam  Constitutional: She is oriented to person, place, and time. She appears well-developed and well-nourished.  HENT:  Head: Normocephalic and atraumatic.  Right Ear: External ear normal.  Left Ear: External ear normal.  Nose: Nose normal.  Mouth/Throat: Oropharynx is clear and moist.  Eyes: Conjunctivae and EOM are normal. Pupils are equal, round, and reactive to light.  Neck: Normal range of motion. Neck supple.  Cardiovascular: Normal rate and normal heart sounds.   Pulmonary/Chest: Effort normal and breath sounds normal.  Neurological: She is alert and oriented to person, place, and time.  Psychiatric: She has a normal mood and affect. Her behavior is normal. Judgment and thought content normal.        Assessment & Plan:     1. Anxiety Improved since starting Zoloft 50mg  daily. Patient is also requesting to have ADD screening done. Referred to psychology as below.  Patient is aware that she has fairly severe anxiety. She thinks she may have ADD and would like referral for evaluation of this. Also think it would be good for the patient to get counseling for her chronic anxiety in addition to medications although the medication certainly help her. I think she probably should be on the sertraline for the next 10-20 years. I think hormonal changes with pregnancy and lactation and then stopping the breast-feeding  contributing greatly to the situation. - Ambulatory referral to Psychology [facility]. RTC 2-3 months. 2. Allergic rhinitis, unspecified allergic rhinitis type Improved.  3. Migraine headaches Stable for now. Again, hormones contributing greatly to this I believe.      Salle Brandle Cranford Mon, MD  [facility]

## 2016-06-09 ENCOUNTER — Encounter: Payer: BLUE CROSS/BLUE SHIELD | Admitting: Family Medicine

## 2016-06-09 DIAGNOSIS — N979 Female infertility, unspecified: Secondary | ICD-10-CM | POA: Diagnosis not present

## 2016-06-10 ENCOUNTER — Encounter: Payer: BLUE CROSS/BLUE SHIELD | Admitting: Family Medicine

## 2016-06-19 ENCOUNTER — Telehealth: Payer: Self-pay | Admitting: Family Medicine

## 2016-06-19 DIAGNOSIS — F419 Anxiety disorder, unspecified: Secondary | ICD-10-CM

## 2016-06-19 NOTE — Telephone Encounter (Signed)
Please review-aa 

## 2016-06-19 NOTE — Telephone Encounter (Signed)
Ok--5 rf,#45

## 2016-06-19 NOTE — Telephone Encounter (Signed)
Pt states she is  75mg  of sertraline (ZOLOFT) 50 MG tablet, pt states she was taking 50MG  but was still feeling angry so she started taking 1 and 1/2 of another at it has seemed to help. She is reqeusting a new RX be sent in

## 2016-06-26 MED ORDER — SERTRALINE HCL 50 MG PO TABS: 50.0000 mg | ORAL_TABLET | Freq: Every day | ORAL | 12 refills | Status: DC

## 2016-06-26 MED ORDER — SERTRALINE HCL 50 MG PO TABS: 75.0000 mg | ORAL_TABLET | Freq: Every day | ORAL | 5 refills | Status: DC

## 2016-06-26 NOTE — Telephone Encounter (Signed)
Pt states her pharmacy [facility] has not rec'd the new Rx for Sertraline 75mg .  CB#[phone].  Pt is completely out of medication/MW

## 2016-06-26 NOTE — Telephone Encounter (Signed)
Medication sent. Pt informed 

## 2016-07-01 DIAGNOSIS — N979 Female infertility, unspecified: Secondary | ICD-10-CM | POA: Diagnosis not present

## 2016-07-01 NOTE — Telephone Encounter (Signed)
error 

## 2016-07-08 ENCOUNTER — Encounter: Payer: BLUE CROSS/BLUE SHIELD | Admitting: Family Medicine

## 2016-07-15 DIAGNOSIS — N8302 Follicular cyst of left ovary: Secondary | ICD-10-CM | POA: Diagnosis not present

## 2016-08-12 ENCOUNTER — Ambulatory Visit (INDEPENDENT_AMBULATORY_CARE_PROVIDER_SITE_OTHER): Payer: BLUE CROSS/BLUE SHIELD | Admitting: Family Medicine

## 2016-08-12 ENCOUNTER — Encounter: Payer: Self-pay | Admitting: Family Medicine

## 2016-08-12 VITALS — BP 96/58 | HR 62 | Temp 98.0°F | Resp 16 | Ht 65.0 in | Wt 125.0 lb

## 2016-08-12 DIAGNOSIS — Z0000 Encounter for general adult medical examination without abnormal findings: Secondary | ICD-10-CM

## 2016-08-12 NOTE — Progress Notes (Signed)
Patient: Brandi [name], Female    DOB: 12/01/1980, 35 y.o.   MRN: [id number] Visit Date: 08/12/2016  Today's Provider: Wilhemena Durie, MD   Chief Complaint  Patient presents with  . Annual Exam   Subjective:    Annual physical exam Brandi [name] is a 35 y.o. female who presents today for health maintenance and complete physical. She feels well. She reports exercising about 2 times a week. She reports she is sleeping fairly well.  ----------------------------------------------------------------- Pap though GYN.  Pt and her husband are currently trying to get pregnant, she would like to discuss the Sertraline with this process.   Review of Systems  Constitutional: Negative.   HENT: Negative.   Eyes: Negative.   Respiratory: Negative.   Cardiovascular: Negative.   Gastrointestinal: Negative.   Endocrine: Negative.   Genitourinary: Negative.   Musculoskeletal: Negative.   Allergic/Immunologic: Negative.   Neurological: Negative.   Hematological: Negative.   Psychiatric/Behavioral: Negative.     Social History      She  reports that she has never smoked. She has never used smokeless tobacco. She reports that she does not drink alcohol or use drugs.       Social History   Social History  . Marital status: Married    Spouse name: N/A  . Number of children: N/A  . Years of education: N/A   Social History Main Topics  . Smoking status: Never Smoker  . Smokeless tobacco: Never Used  . Alcohol use No  . Drug use: No  . Sexual activity: Yes   Other Topics Concern  . None   Social History Narrative  . None    Past Medical History:  Diagnosis Date  . Hypothyroidism      Patient Active Problem List   Diagnosis Date Noted  . Allergic rhinitis 11/12/2015  . Anxiety 11/12/2015  . 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 Normal pregnancy, incidental 11/12/2015  . Active labor at term 01/10/2015  . S/P cesarean section 01/10/2015    Past Surgical History:    Procedure Laterality Date  . CESAREAN SECTION    . CESAREAN SECTION N/A 01/10/2015   Procedure: CESAREAN SECTION;  Surgeon: Melina Schools, MD;  Location: [facility];  Service: Obstetrics;  Laterality: N/A;  . laprascopic      Family History        Family Status  Relation Status  . Mother Alive  . Father Alive  . Brother Alive  . Paternal Uncle Deceased  . Maternal Grandmother Alive  . Maternal Grandfather Deceased  . Paternal Grandmother Deceased  . Paternal Grandfather Deceased  . Brother Alive  . Neg Hx         Her family history includes Aneurysm in her paternal grandfather; Anxiety disorder in her mother; Cancer in her father, maternal grandmother, mother, and paternal uncle; Dementia in her maternal grandmother; Hyperlipidemia in her father and mother; Migraines in her mother; Stroke in her maternal grandfather.    Allergies  Allergen Reactions  . Neosporin [Neomycin-Bacitracin Zn-Polymyx] Rash  . Sulfa Antibiotics Rash    Current Meds  Medication Sig  . ibuprofen (ADVIL,MOTRIN) 600 MG tablet Take 1 tablet (600 mg total) by mouth every 6 (six) hours.  . Prenatal Vit-Fe Fumarate-FA (PRENATAL MULTIVITAMIN) TABS tablet Take 1 tablet by mouth daily at 12 noon. Reported on 05/08/2016  . sertraline (ZOLOFT) 50 MG tablet Take 1.5 tablets (75 mg total) by mouth daily. D/c 50 mg. This is a new dosage.  Thanks  . SUMAtriptan (IMITREX) 50 MG tablet Take 1 tablet (50 mg total) by mouth every 2 (two) hours as needed for migraine. May repeat in 2 hours if headache persists or recurs.    Patient Care Team: Jerrol Banana., MD as PCP - General (Family Medicine)     Objective:   Vitals: BP (!) 96/58 (BP Location: Left Arm, Patient Position: Sitting, Cuff Size: Normal)   Pulse 62   Temp 98 F (36.7 C) (Oral)   Resp 16   Ht 5\' 5"  (1.651 m)   Wt 125 lb (56.7 kg)   BMI 20.80 kg/m    Physical Exam  Constitutional: She is oriented to person, place, and time. She appears  well-developed and well-nourished.  HENT:  Head: Normocephalic and atraumatic.  Right Ear: External ear normal.  Left Ear: External ear normal.  Nose: Nose normal.  Mouth/Throat: Oropharynx is clear and moist.  Eyes: Conjunctivae and EOM are normal. Pupils are equal, round, and reactive to light.  Neck: Normal range of motion. Neck supple.  Cardiovascular: Normal rate, regular rhythm, normal heart sounds and intact distal pulses.   Pulmonary/Chest: Effort normal and breath sounds normal.  Abdominal: Soft. Bowel sounds are normal.  Musculoskeletal: Normal range of motion.  Neurological: She is alert and oriented to person, place, and time. She has normal reflexes.  Skin: Skin is warm and dry.  Psychiatric: She has a normal mood and affect. Her behavior is normal. Judgment and thought content normal.     Depression Screen PHQ 2/9 Scores 08/12/2016  PHQ - 2 Score 0      Assessment & Plan:     Routine Health Maintenance and Physical Exam  Exercise Activities and Dietary recommendations Goals    None      Immunization History  Administered Date(s) Administered  . Influenza,inj,Quad PF,36+ Mos 09/27/2015    Health Maintenance  Topic Date Due  . TETANUS/TDAP  08/09/1999  . PAP SMEAR  08/08/2001  . INFLUENZA VACCINE  06/24/2016  . HIV Screening  Completed      Discussed health benefits of physical activity, and encouraged her to engage in regular exercise appropriate for her age and condition.   Depression/chronic anxiety Wean SSRI. We'll probably need to restart after she is through breast-feeding. She and her husband are getting ready go through in vitro again. I have done the exam and reviewed the chart and it is accurate to the best of my knowledge. Miguel Aschoff M.D. [facility]  --------------------------------------------------------------------    Wilhemena Durie, MD  [facility]

## 2016-08-13 IMAGING — CT CT HEAD W/O CM
2 series · 15 of 30 positions shown, 17 images · non-contrast
Comparison: 12/31/2011

CLINICAL DATA: Headaches, blurry vision since January 2016. History
of migraines.

EXAM:
CT HEAD WITHOUT CONTRAST
TECHNIQUE: Contiguous axial images were obtained from the base of the skull
through the vertex without intravenous contrast.

[Series 2: head wo · axial · 0.41mm/px · z∈[-110,-5]mm · 7 of 29 slices shown, 9 images]
[im 4/29  brain]
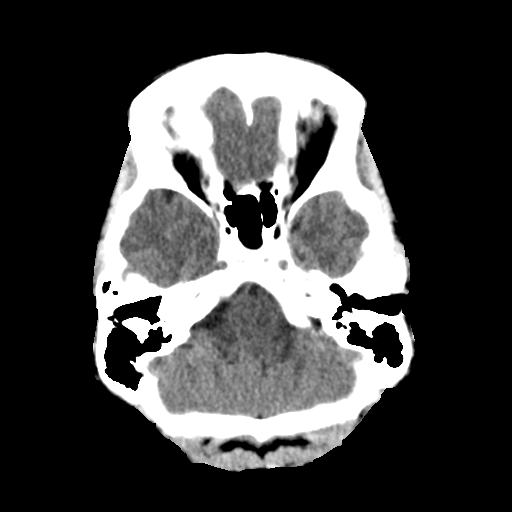
[im 4/29  bone]
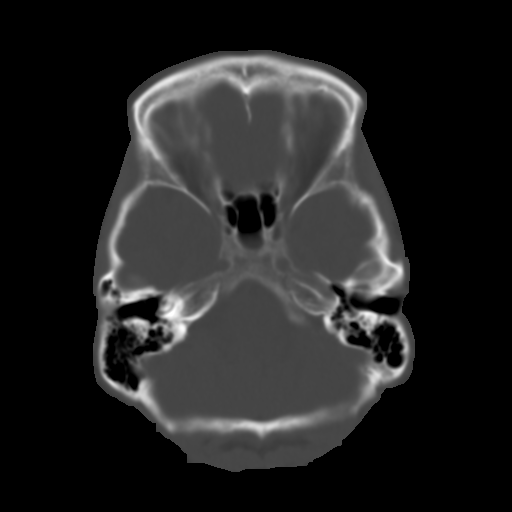
[im 8/29  brain]
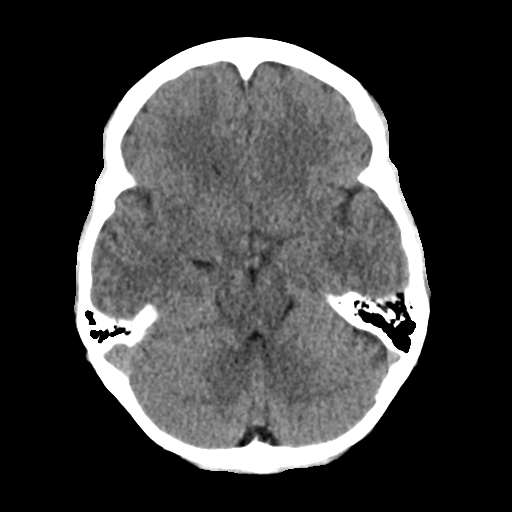
[im 11/29  brain]
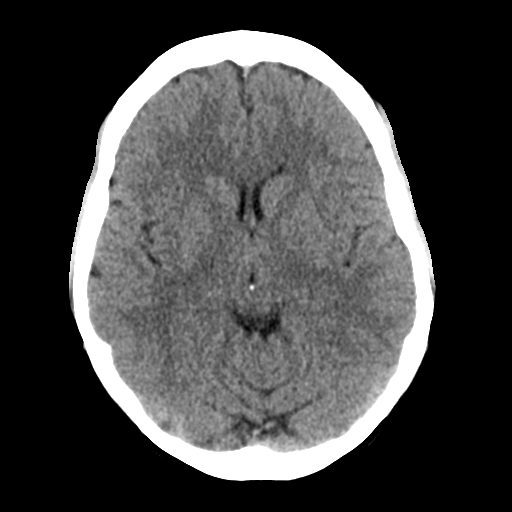
[im 15/29  brain]
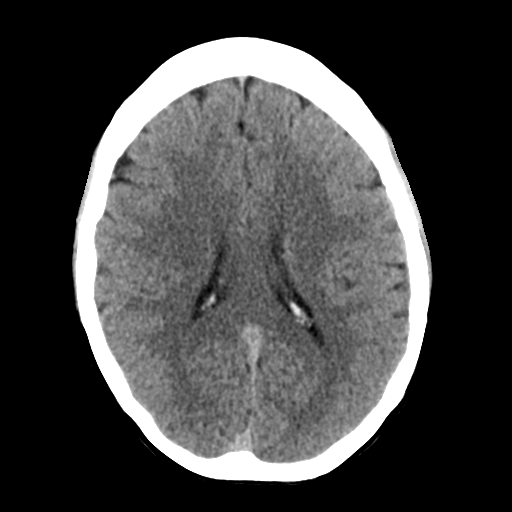
[im 18/29  brain]
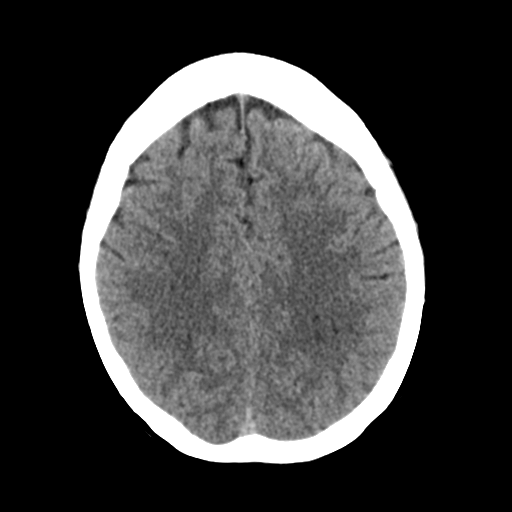
[im 18/29  bone]
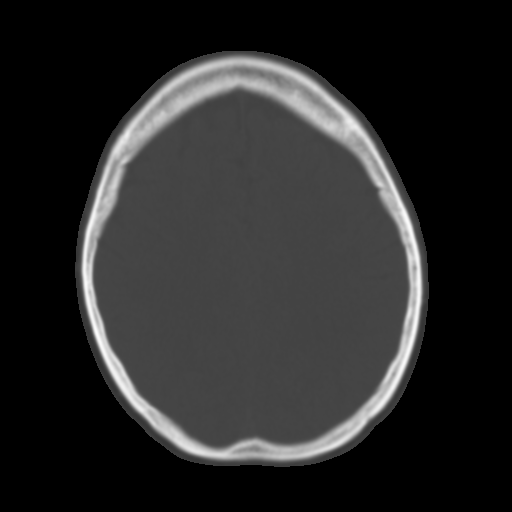
[im 22/29  brain]
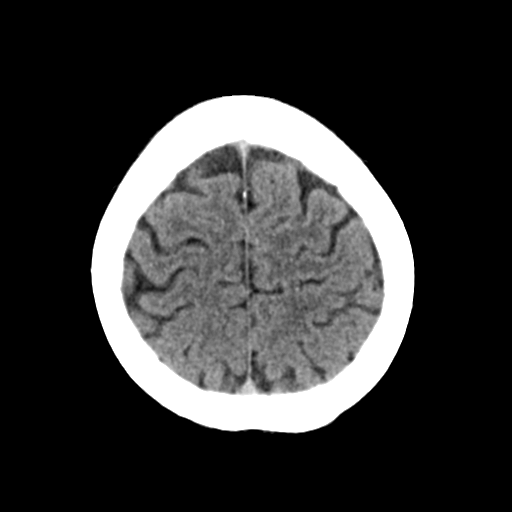
[im 25/29  brain]
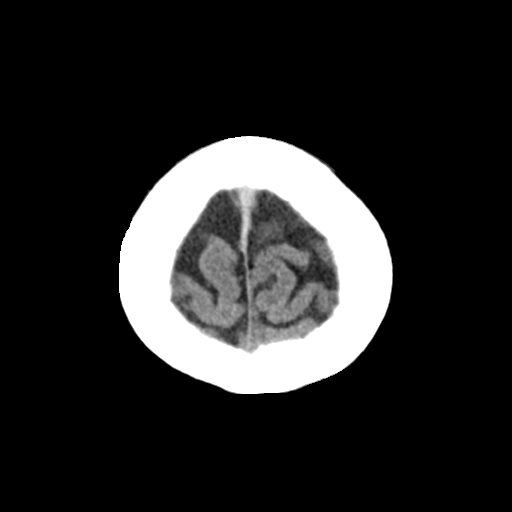

[Series 3: head bone · axial · 0.41mm/px · z∈[-111,+1]mm · 8 of 72 slices shown]
[im 8/72  bone]
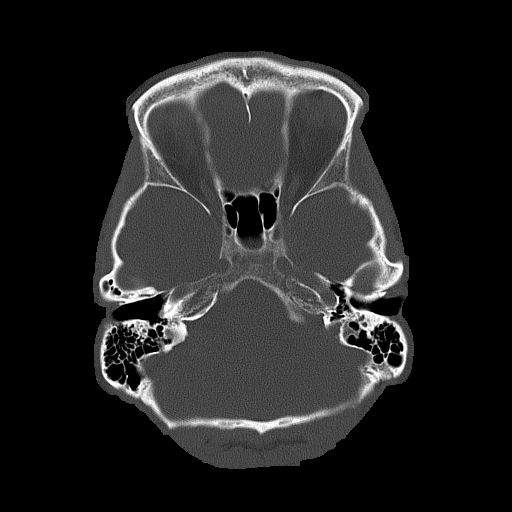
[im 15/72  bone]
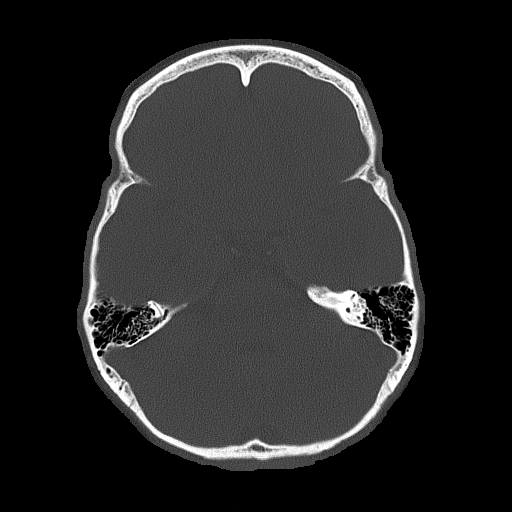
[im 22/72  bone]
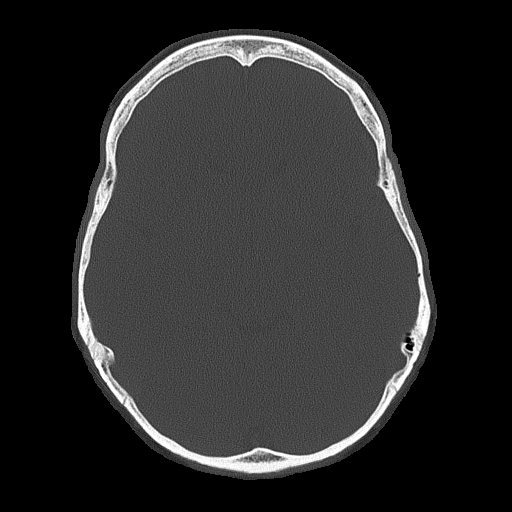
[im 32/72  bone]
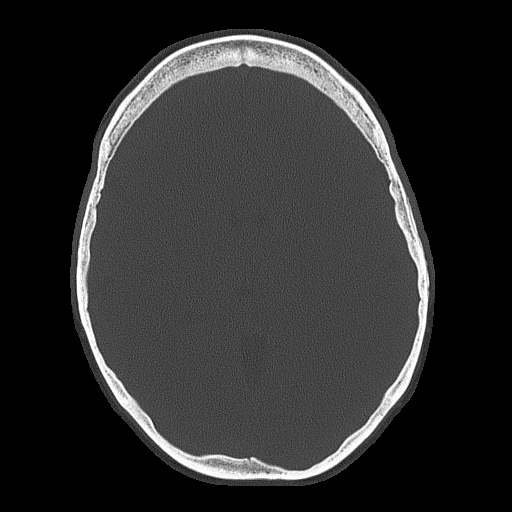
[im 40/72  bone]
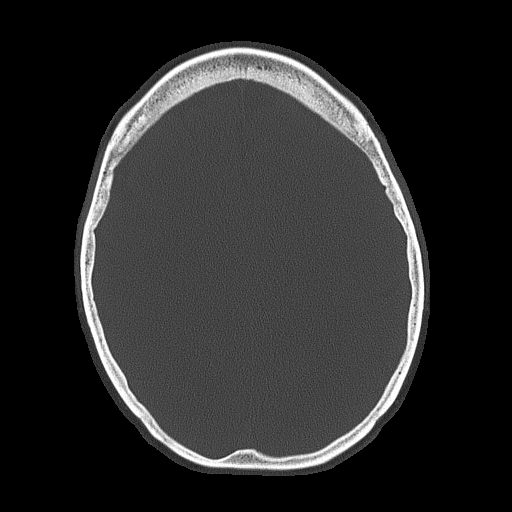
[im 50/72  bone]
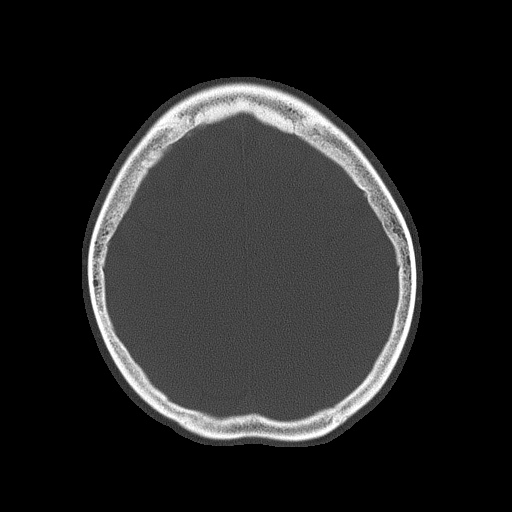
[im 57/72  bone]
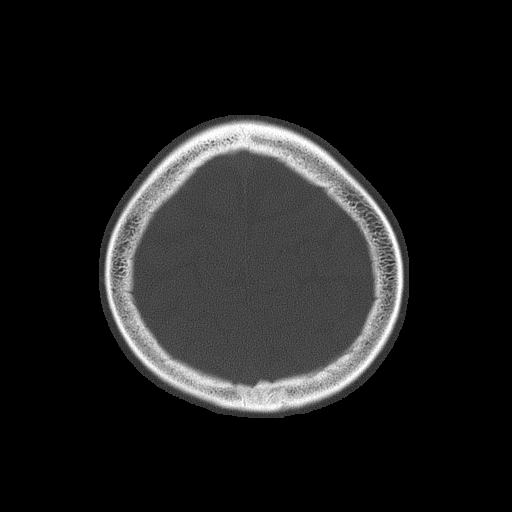
[im 64/72  bone]
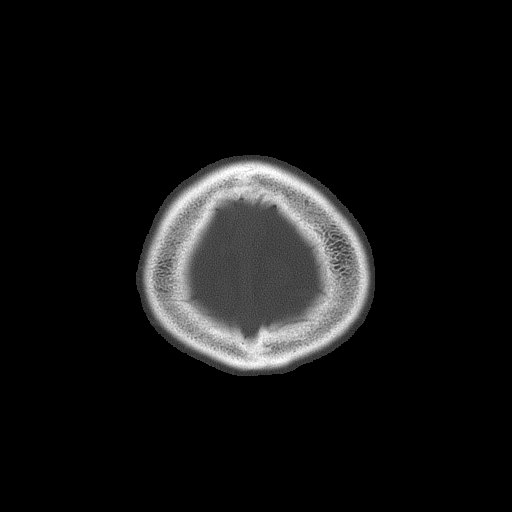

[15 of 30 positions shown; findings below may reference images not displayed]

FINDINGS: Brain: No evidence of acute infarction, hemorrhage, extra-axial
collection, ventriculomegaly, or mass effect.

Vascular: No hyperdense vessel or unexpected calcification.

Skull: Negative for fracture or focal lesion.

Sinuses/Orbits: No acute findings.

Other: None.
IMPRESSION: No acute intracranial pathology.

## 2016-08-18 ENCOUNTER — Other Ambulatory Visit: Payer: Self-pay | Admitting: Family Medicine

## 2016-08-18 NOTE — Telephone Encounter (Signed)
TSH was ordered last week. Patient still has not had a chance to have labs drawn. Ok to refill levothyroxine? If so, how many refills? Thanks!

## 2016-08-18 NOTE — Telephone Encounter (Signed)
Pt needs refill on her synthroid.  She uses [facility].  Thank sTeri

## 2016-08-19 MED ORDER — LEVOTHYROXINE SODIUM 50 MCG PO TABS: 50.0000 ug | ORAL_TABLET | Freq: Every day | ORAL | 6 refills | Status: DC

## 2016-08-25 ENCOUNTER — Telehealth: Payer: Self-pay | Admitting: Family Medicine

## 2016-08-25 NOTE — Telephone Encounter (Signed)
Pt advised and lab slip placed up front-aa

## 2016-08-25 NOTE — Telephone Encounter (Signed)
Pt called saying she has lost her paper for labs.  She wants to come in the next couple of days to pick up the lab order.  Please put up front.  Thanks, [employer]

## 2016-08-27 DIAGNOSIS — Z0000 Encounter for general adult medical examination without abnormal findings: Secondary | ICD-10-CM | POA: Diagnosis not present

## 2016-08-28 ENCOUNTER — Ambulatory Visit: Payer: BLUE CROSS/BLUE SHIELD

## 2016-08-28 LAB — COMPREHENSIVE METABOLIC PANEL
ALK PHOS: 43 IU/L (ref 39–117)
ALT: 12 IU/L (ref 0–32)
AST: 17 IU/L (ref 0–40)
Albumin/Globulin Ratio: 2 (ref 1.2–2.2)
Albumin: 4.5 g/dL (ref 3.5–5.5)
BILIRUBIN TOTAL: 0.4 mg/dL (ref 0.0–1.2)
BUN/Creatinine Ratio: 16 (ref 9–23)
BUN: 10 mg/dL (ref 6–20)
CHLORIDE: 99 mmol/L (ref 96–106)
CO2: 27 mmol/L (ref 18–29)
Calcium: 9 mg/dL (ref 8.7–10.2)
Creatinine, Ser: 0.64 mg/dL (ref 0.57–1.00)
GFR calc non Af Amer: 115 mL/min/{1.73_m2} (ref >59)
GFR, EST AFRICAN AMERICAN: 133 mL/min/{1.73_m2} (ref >59)
GLUCOSE: 76 mg/dL (ref 65–99)
Globulin, Total: 2.3 g/dL (ref 1.5–4.5)
Potassium: 4.7 mmol/L (ref 3.5–5.2)
Sodium: 139 mmol/L (ref 134–144)
TOTAL PROTEIN: 6.8 g/dL (ref 6.0–8.5)

## 2016-08-28 LAB — CBC WITH DIFFERENTIAL/PLATELET
BASOS: 2 %
Basophils Absolute: 0.1 10*3/uL (ref 0.0–0.2)
EOS (ABSOLUTE): 0.1 10*3/uL (ref 0.0–0.4)
Eos: 1 %
Hematocrit: 40.1 % (ref 34.0–46.6)
Hemoglobin: 13.5 g/dL (ref 11.1–15.9)
IMMATURE GRANS (ABS): 0 10*3/uL (ref 0.0–0.1)
Immature Granulocytes: 0 %
LYMPHS: 40 %
Lymphocytes Absolute: 1.8 10*3/uL (ref 0.7–3.1)
MCH: 29.4 pg (ref 26.6–33.0)
MCHC: 33.7 g/dL (ref 31.5–35.7)
MCV: 87 fL (ref 79–97)
MONOS ABS: 0.3 10*3/uL (ref 0.1–0.9)
Monocytes: 6 %
NEUTROS ABS: 2.2 10*3/uL (ref 1.4–7.0)
Neutrophils: 51 %
PLATELETS: 311 10*3/uL (ref 150–379)
RBC: 4.59 x10E6/uL (ref 3.77–5.28)
RDW: 13 % (ref 12.3–15.4)
WBC: 4.4 10*3/uL (ref 3.4–10.8)

## 2016-08-28 LAB — LIPID PANEL WITH LDL/HDL RATIO
Cholesterol, Total: 191 mg/dL (ref 100–199)
HDL: 64 mg/dL (ref >39)
LDL Calculated: 106 mg/dL — ABNORMAL HIGH (ref 0–99)
LDL/HDL RATIO: 1.7 ratio (ref 0.0–3.2)
Triglycerides: 105 mg/dL (ref 0–149)
VLDL CHOLESTEROL CAL: 21 mg/dL (ref 5–40)

## 2016-08-28 LAB — TSH: TSH: 1.08 u[IU]/mL (ref 0.450–4.500)

## 2016-09-11 ENCOUNTER — Ambulatory Visit (INDEPENDENT_AMBULATORY_CARE_PROVIDER_SITE_OTHER): Payer: BLUE CROSS/BLUE SHIELD

## 2016-09-11 DIAGNOSIS — Z23 Encounter for immunization: Secondary | ICD-10-CM | POA: Diagnosis not present

## 2016-09-11 DIAGNOSIS — Z3201 Encounter for pregnancy test, result positive: Secondary | ICD-10-CM | POA: Diagnosis not present

## 2016-09-11 DIAGNOSIS — N979 Female infertility, unspecified: Secondary | ICD-10-CM | POA: Diagnosis not present

## 2016-10-31 ENCOUNTER — Ambulatory Visit (INDEPENDENT_AMBULATORY_CARE_PROVIDER_SITE_OTHER): Payer: BLUE CROSS/BLUE SHIELD | Admitting: Physician Assistant

## 2016-10-31 ENCOUNTER — Encounter: Payer: Self-pay | Admitting: Physician Assistant

## 2016-10-31 VITALS — BP 102/70 | HR 84 | Temp 98.6°F | Resp 16 | Wt 125.0 lb

## 2016-10-31 DIAGNOSIS — Z803 Family history of malignant neoplasm of breast: Secondary | ICD-10-CM

## 2016-10-31 DIAGNOSIS — R9230 Dense breasts, unspecified: Secondary | ICD-10-CM

## 2016-10-31 DIAGNOSIS — N644 Mastodynia: Secondary | ICD-10-CM | POA: Diagnosis not present

## 2016-10-31 DIAGNOSIS — R590 Localized enlarged lymph nodes: Secondary | ICD-10-CM

## 2016-10-31 DIAGNOSIS — R922 Inconclusive mammogram: Secondary | ICD-10-CM

## 2016-10-31 NOTE — Progress Notes (Addendum)
Patient: Brandi [name] Female    DOB: 1981/07/25   35 y.o.   MRN: [id number] Visit Date: 10/31/2016  Today's Provider: Trinna Post, PA-C   No chief complaint on file.  Subjective:    HPI   Pt is a 35 y/o female with a history of recent fertility treatments presenting today complaining of a "Knot" on her right groin area.  She first noticed it this morning. She said the area was sore and then she felt the knot. She does shave her genital area. She has one sexual partner and is not concerned about STDs. She denies any rashes generally, but particularly on her hands and feet. She has had past C-sections but no recent surgeries. She denies recent travel or tick bites. She denies fevers, chills, unintentional weight loss. She does have some night sweats which are usual for her.   She is also complaining of right sided breast pain around her nipple region. She describes this as sharp and stabbing. She denies skin changes, rash, bleeding, inversion of nipple, discharge. No problems in left breast. Has had mammogram at 58 which was normal, but reports she has history of dense breast tissue in her right breast. She has a mother with history of breast cancer at age 14 and a grandmother with a history of breast cancer and uterine cancer in her late 28's.        Allergies  Allergen Reactions  . Neosporin [Neomycin-Bacitracin Zn-Polymyx] Rash  . Sulfa Antibiotics Rash     Current Outpatient Prescriptions:  .  levothyroxine (SYNTHROID, LEVOTHROID) 50 MCG tablet, Take 1 tablet (50 mcg total) by mouth daily., Disp: 30 tablet, Rfl: 6 .  sertraline (ZOLOFT) 50 MG tablet, Take 1.5 tablets (75 mg total) by mouth daily. D/c 50 mg. This is a new dosage. Thanks, Disp: 45 tablet, Rfl: 5 .  ibuprofen (ADVIL,MOTRIN) 600 MG tablet, Take 1 tablet (600 mg total) by mouth every 6 (six) hours., Disp: 30 tablet, Rfl: 0  Review of Systems  Constitutional: Negative.   Gastrointestinal: Positive for  diarrhea and nausea. Negative for abdominal distention, abdominal pain, anal bleeding, blood in stool, constipation, rectal pain and vomiting.  Genitourinary: Positive for pelvic pain (Especially during ovulation) and vaginal discharge. Negative for difficulty urinating, frequency, hematuria, urgency, vaginal bleeding (Did have some abnormal bleeding when she was ovulating last weekend.  ) and vaginal pain.  Musculoskeletal: Positive for arthralgias (Right sided hip pain started about two months ago. ). Negative for back pain, gait problem, joint swelling, myalgias, neck pain and neck stiffness.    Social History  Substance Use Topics  . Smoking status: Never Smoker  . Smokeless tobacco: Never Used  . Alcohol use No   Objective:   BP 102/70 (BP Location: Left Arm, Patient Position: Sitting, Cuff Size: Normal)   Pulse 84   Temp 98.6 F (37 C) (Oral)   Resp 16   Wt 125 lb (56.7 kg)   LMP 10/10/2016   BMI 20.80 kg/m   Physical Exam  Constitutional: She appears well-developed and well-nourished.  Pulmonary/Chest: Right breast exhibits no inverted nipple, no mass, no nipple discharge, no skin change and no tenderness. Left breast exhibits no inverted nipple, no mass, no nipple discharge, no skin change and no tenderness. Breasts are symmetrical.  Breast pain at 9 o'clock on right breast    Abdominal: Soft. There is no tenderness. Hernia confirmed negative in the right inguinal area and confirmed negative in the left  inguinal area.  Genitourinary: No breast swelling, tenderness, discharge or bleeding.  Genitourinary Comments: Small mobile mass, <1cm felt upon deep palpation of right inguinal region.   Lymphadenopathy:       Right: Inguinal adenopathy present.       Left: No inguinal adenopathy present.  Skin:  Small abrasion on left knee. Patient does shave genital region.         Assessment & Plan:      Problem List Items Addressed This Visit    None    Visit Diagnoses     Inguinal lymphadenopathy    -  Primary   Relevant Orders   CBC with Differential/Platelet   Breast pain, right       Relevant Orders   MM Digital Diagnostic Unilat R   Dense breast tissue       Relevant Orders   MM Digital Diagnostic Unilat R     Patient is 35 y/o female presenting with right inguinal adenopathy and right breast pain. Suspect inguinal lymphadenopathy is infectious, will get CBC to check white count. Patient unconcerned for STIs and declines evaluation for this. Patient with right breast pain and dense right breast tissue on exam, will order right unilateral mammogram to evaluate further since exam is limited 2/2 density of breast tissue.        Trinna Post, PA-C  [facility]

## 2016-11-01 LAB — CBC WITH DIFFERENTIAL/PLATELET
Basophils Absolute: 0.1 10*3/uL (ref 0.0–0.2)
Basos: 1 %
EOS (ABSOLUTE): 0 10*3/uL (ref 0.0–0.4)
Eos: 1 %
Hematocrit: 39.7 % (ref 34.0–46.6)
Hemoglobin: 13.3 g/dL (ref 11.1–15.9)
Immature Grans (Abs): 0 10*3/uL (ref 0.0–0.1)
Immature Granulocytes: 0 %
Lymphocytes Absolute: 1.9 10*3/uL (ref 0.7–3.1)
Lymphs: 40 %
MCH: 29.1 pg (ref 26.6–33.0)
MCHC: 33.5 g/dL (ref 31.5–35.7)
MCV: 87 fL (ref 79–97)
Monocytes Absolute: 0.4 10*3/uL (ref 0.1–0.9)
Monocytes: 9 %
Neutrophils Absolute: 2.3 10*3/uL (ref 1.4–7.0)
Neutrophils: 49 %
Platelets: 287 10*3/uL (ref 150–379)
RBC: 4.57 x10E6/uL (ref 3.77–5.28)
RDW: 12.8 % (ref 12.3–15.4)
WBC: 4.7 10*3/uL (ref 3.4–10.8)

## 2016-11-03 ENCOUNTER — Telehealth: Payer: Self-pay

## 2016-11-03 NOTE — Telephone Encounter (Signed)
Noted, thank you

## 2016-11-03 NOTE — Telephone Encounter (Signed)
Pt advised.  She reports the lymph node is still there.  It is about the same size.  She says she will call back if it doesn't go away by the end of the week.   Thanks,   -Mickel Baas

## 2016-11-03 NOTE — Telephone Encounter (Signed)
-----   Message from Trinna Post, [place] sent at 11/03/2016  1:29 PM EST ----- CBC is normal, no signs of infection. Did patient's lymph node go away?

## 2016-11-05 NOTE — Addendum Note (Signed)
Addended by: Trinna Post on: 11/05/2016 04:37 PM   Modules accepted: Orders

## 2016-11-05 NOTE — Addendum Note (Signed)
Addended by: Trinna Post on: 11/05/2016 02:22 PM   Modules accepted: Orders

## 2016-11-21 ENCOUNTER — Other Ambulatory Visit: Payer: Self-pay | Admitting: *Deleted

## 2016-11-21 ENCOUNTER — Inpatient Hospital Stay
Admission: RE | Admit: 2016-11-21 | Discharge: 2016-11-21 | Disposition: A | Discharge status: Home / Self Care | Payer: Self-pay | Source: Ambulatory Visit | Attending: *Deleted | Admitting: *Deleted

## 2016-11-21 DIAGNOSIS — Z9289 Personal history of other medical treatment: Secondary | ICD-10-CM

## 2016-11-26 ENCOUNTER — Ambulatory Visit
Admission: RE | Admit: 2016-11-26 | Discharge: 2016-11-26 | Disposition: A | Discharge status: Home / Self Care | Payer: BLUE CROSS/BLUE SHIELD | Source: Ambulatory Visit | Attending: Physician Assistant | Admitting: Physician Assistant

## 2016-11-26 ENCOUNTER — Other Ambulatory Visit: Payer: Self-pay | Admitting: Physician Assistant

## 2016-11-26 DIAGNOSIS — Z803 Family history of malignant neoplasm of breast: Secondary | ICD-10-CM

## 2016-11-26 DIAGNOSIS — N644 Mastodynia: Secondary | ICD-10-CM

## 2016-11-26 DIAGNOSIS — R922 Inconclusive mammogram: Secondary | ICD-10-CM | POA: Diagnosis not present

## 2016-11-26 NOTE — Progress Notes (Signed)
Patient advised.

## 2016-12-02 DIAGNOSIS — R590 Localized enlarged lymph nodes: Secondary | ICD-10-CM | POA: Diagnosis not present

## 2016-12-02 DIAGNOSIS — N898 Other specified noninflammatory disorders of vagina: Secondary | ICD-10-CM | POA: Diagnosis not present

## 2016-12-19 ENCOUNTER — Ambulatory Visit (INDEPENDENT_AMBULATORY_CARE_PROVIDER_SITE_OTHER): Payer: BLUE CROSS/BLUE SHIELD | Admitting: Family Medicine

## 2016-12-19 ENCOUNTER — Encounter: Payer: Self-pay | Admitting: Family Medicine

## 2016-12-19 VITALS — BP 104/60 | HR 72 | Temp 98.7°F | Resp 16 | Wt 126.0 lb

## 2016-12-19 DIAGNOSIS — R059 Cough, unspecified: Secondary | ICD-10-CM

## 2016-12-19 DIAGNOSIS — R05 Cough: Secondary | ICD-10-CM | POA: Diagnosis not present

## 2016-12-19 DIAGNOSIS — J029 Acute pharyngitis, unspecified: Secondary | ICD-10-CM

## 2016-12-19 LAB — POCT INFLUENZA A/B
INFLUENZA B, POC: NEGATIVE
Influenza A, POC: NEGATIVE

## 2016-12-19 LAB — POCT RAPID STREP A (OFFICE): Rapid Strep A Screen: NEGATIVE

## 2016-12-19 NOTE — Progress Notes (Signed)
       Patient: Brandi [name] Female    DOB: 22-Nov-1981   36 y.o.   MRN: [id number] Visit Date: 12/19/2016  Today's Provider: Wilhemena Durie, MD   Chief Complaint  Patient presents with  . Sore Throat   Subjective:    Sore Throat   This is a new problem. The current episode started in the past 7 days (x 3-4 days). The problem has been gradually worsening. The pain is worse on the left side. There has been no fever. The pain is at a severity of 3/10. Associated symptoms include congestion, coughing (productive with yellow sputum), headaches, a hoarse voice, a plugged ear sensation, neck pain and swollen glands. Pertinent negatives include no abdominal pain, diarrhea, ear pain, shortness of breath, trouble swallowing or vomiting. She has had no exposure to strep. She has tried nothing for the symptoms.       Allergies  Allergen Reactions  . Neosporin [Neomycin-Bacitracin Zn-Polymyx] Rash  . Sulfa Antibiotics Rash     Current Outpatient Prescriptions:  .  ibuprofen (ADVIL,MOTRIN) 600 MG tablet, Take 1 tablet (600 mg total) by mouth every 6 (six) hours., Disp: 30 tablet, Rfl: 0 .  levothyroxine (SYNTHROID, LEVOTHROID) 50 MCG tablet, Take 1 tablet (50 mcg total) by mouth daily., Disp: 30 tablet, Rfl: 6 .  sertraline (ZOLOFT) 50 MG tablet, Take 1.5 tablets (75 mg total) by mouth daily. D/c 50 mg. This is a new dosage. Thanks, Disp: 45 tablet, Rfl: 5  Review of Systems  Constitutional: Negative.   HENT: Positive for congestion and hoarse voice. Negative for ear pain and trouble swallowing.   Respiratory: Positive for cough (productive with yellow sputum). Negative for shortness of breath.   Gastrointestinal: Negative for abdominal pain, diarrhea and vomiting.  Musculoskeletal: Positive for neck pain.  Neurological: Positive for headaches.  Psychiatric/Behavioral: Negative.     Social History  Substance Use Topics  . Smoking status: Never Smoker  . Smokeless tobacco: Never  Used  . Alcohol use No   Objective:   There were no vitals taken for this visit.  Physical Exam  Constitutional: She is oriented to person, place, and time. She appears well-developed and well-nourished.  HENT:  Head: Normocephalic and atraumatic.  Right Ear: External ear normal.  Left Ear: External ear normal.  Nose: Nose normal.  Mouth/Throat: Oropharynx is clear and moist. No oropharyngeal exudate.  Eyes: Conjunctivae are normal. No scleral icterus.  Neck: No thyromegaly present.  Cardiovascular: Normal rate, regular rhythm and normal heart sounds.   Pulmonary/Chest: Effort normal and breath sounds normal.  Abdominal: Soft.  Lymphadenopathy:    She has no cervical adenopathy.  Neurological: She is alert and oriented to person, place, and time.  Skin: Skin is warm and dry.  Psychiatric: She has a normal mood and affect. Her behavior is normal. Judgment and thought content normal.        Assessment & Plan:     1. Sore throat - POCT rapid strep A--negative  2. Cough Viral URI. - POCT Influenza A/B      I have done the exam and reviewed the chart and it is accurate to the best of my knowledge. Development worker, community has been used and  any errors in dictation or transcription are unintentional. Miguel Aschoff M.D. [place], MD  [facility]

## 2016-12-30 ENCOUNTER — Other Ambulatory Visit: Payer: Self-pay | Admitting: Family Medicine

## 2016-12-30 DIAGNOSIS — F419 Anxiety disorder, unspecified: Secondary | ICD-10-CM

## 2017-01-05 ENCOUNTER — Ambulatory Visit (INDEPENDENT_AMBULATORY_CARE_PROVIDER_SITE_OTHER): Payer: BLUE CROSS/BLUE SHIELD | Admitting: Family Medicine

## 2017-01-05 ENCOUNTER — Encounter: Payer: Self-pay | Admitting: Family Medicine

## 2017-01-05 VITALS — BP 110/72 | HR 73 | Temp 98.2°F | Resp 14 | Wt 127.2 lb

## 2017-01-05 DIAGNOSIS — K648 Other hemorrhoids: Secondary | ICD-10-CM

## 2017-01-05 MED ORDER — HYDROCORTISONE ACETATE 25 MG RE SUPP: 25.0000 mg | Freq: Two times a day (BID) | RECTAL | 0 refills | Status: DC

## 2017-01-05 NOTE — Patient Instructions (Signed)
Hemorrhoids Hemorrhoids are swollen veins in and around the rectum or anus. There are two types of hemorrhoids:  Internal hemorrhoids. These occur in the veins that are just inside the rectum. They may poke through to the outside and become irritated and painful.  External hemorrhoids. These occur in the veins that are outside of the anus and can be felt as a painful swelling or hard lump near the anus.  Most hemorrhoids do not cause serious problems, and they can be managed with home treatments such as diet and lifestyle changes. If home treatments do not help your symptoms, procedures can be done to shrink or remove the hemorrhoids. What are the causes? This condition is caused by increased pressure in the anal area. This pressure may result from various things, including:  Constipation.  Straining to have a bowel movement.  Diarrhea.  Pregnancy.  Obesity.  Sitting for long periods of time.  Heavy lifting or other activity that causes you to strain.  Anal sex.  What are the signs or symptoms? Symptoms of this condition include:  Pain.  Anal itching or irritation.  Rectal bleeding.  Leakage of stool (feces).  Anal swelling.  One or more lumps around the anus.  How is this diagnosed? This condition can often be diagnosed through a visual exam. Other exams or tests may also be done, such as:  Examination of the rectal area with a gloved hand (digital rectal exam).  Examination of the anal canal using a small tube (anoscope).  A blood test, if you have lost a significant amount of blood.  A test to look inside the colon (sigmoidoscopy or colonoscopy).  How is this treated? This condition can usually be treated at home. However, various procedures may be done if dietary changes, lifestyle changes, and other home treatments do not help your symptoms. These procedures can help make the hemorrhoids smaller or remove them completely. Some of these procedures involve  surgery, and others do not. Common procedures include:  Rubber band ligation. Rubber bands are placed at the base of the hemorrhoids to cut off the blood supply to them.  Sclerotherapy. Medicine is injected into the hemorrhoids to shrink them.  Infrared coagulation. A type of light energy is used to get rid of the hemorrhoids.  Hemorrhoidectomy surgery. The hemorrhoids are surgically removed, and the veins that supply them are tied off.  Stapled hemorrhoidopexy surgery. A circular stapling device is used to remove the hemorrhoids and use staples to cut off the blood supply to them.  Follow these instructions at home: Eating and drinking  Eat foods that have a lot of fiber in them, such as whole grains, beans, nuts, fruits, and vegetables. Ask your health care provider about taking products that have added fiber (fiber supplements).  Drink enough fluid to keep your urine clear or pale yellow. Managing pain and swelling  Take warm sitz baths for 20 minutes, 3-4 times a day to ease pain and discomfort.  If directed, apply ice to the affected area. Using ice packs between sitz baths may be helpful. ? Put ice in a plastic bag. ? Place a towel between your skin and the bag. ? Leave the ice on for 20 minutes, 2-3 times a day. General instructions  Take over-the-counter and prescription medicines only as told by your health care provider.  Use medicated creams or suppositories as told.  Exercise regularly.  Go to the bathroom when you have the urge to have a bowel movement. Do not wait.    Avoid straining to have bowel movements.  Keep the anal area dry and clean. Use wet toilet paper or moist towelettes after a bowel movement.  Do not sit on the toilet for long periods of time. This increases blood pooling and pain. Contact a health care provider if:  You have increasing pain and swelling that are not controlled by treatment or medicine.  You have uncontrolled bleeding.  You  have difficulty having a bowel movement, or you are unable to have a bowel movement.  You have pain or inflammation outside the area of the hemorrhoids. This information is not intended to replace advice given to you by your health care provider. Make sure you discuss any questions you have with your health care provider. Document Released: 11/07/2000 Document Revised: 04/09/2016 Document Reviewed: 07/25/2015 Elsevier Interactive Patient Education  2017 [employer]  

## 2017-01-05 NOTE — Progress Notes (Signed)
Patient: Brandi [name] Female    DOB: March 13, 1981   36 y.o.   MRN: [id number] Visit Date: 01/05/2017  Today's Provider: Vernie Murders, PA   Chief Complaint  Patient presents with  . Rectal Bleeding   Subjective:    Rectal Bleeding   The current episode started 2 days ago. The onset was sudden. Episode frequency: episodes. The problem has been unchanged. The patient is experiencing no pain. The stool is described as soft. There was no prior successful therapy. There was no prior unsuccessful therapy.   Past Medical History:  Diagnosis Date  . Hypothyroidism    Patient Active Problem List   Diagnosis Date Noted  . Allergic rhinitis 11/12/2015  . Anxiety 11/12/2015  . 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 Normal pregnancy, incidental 11/12/2015  . Active labor at term 01/10/2015  . S/P cesarean section 01/10/2015   Past Surgical History:  Procedure Laterality Date  . CESAREAN SECTION    . CESAREAN SECTION N/A 01/10/2015   Procedure: CESAREAN SECTION;  Surgeon: Melina Schools, MD;  Location: [facility];  Service: Obstetrics;  Laterality: N/A;  . laprascopic     Family History  Problem Relation Age of Onset  . Migraines Mother   . Cancer Mother     breast  . Hyperlipidemia Mother   . Anxiety disorder Mother 47  . Breast cancer Mother 65  . Hyperlipidemia Father   . Cancer Father     melanoma  . Cancer Paternal Uncle     brain tumor  . Cancer Maternal Grandmother     breast and uterine  . Dementia Maternal Grandmother   . Breast cancer Maternal Grandmother 64  . Stroke Maternal Grandfather   . Aneurysm Paternal Grandfather   . Alcohol abuse Neg Hx   . Arthritis Neg Hx   . Asthma Neg Hx   . Birth defects Neg Hx   . COPD Neg Hx   . Depression Neg Hx   . Diabetes Neg Hx   . Drug abuse Neg Hx   . Early death Neg Hx   . Hearing loss Neg Hx   . Heart disease Neg Hx   . Hypertension Neg Hx   . Kidney disease Neg Hx   . Learning disabilities Neg Hx   . Mental illness Neg  Hx   . Mental retardation Neg Hx   . Miscarriages / Stillbirths Neg Hx   . Vision loss Neg Hx   . Varicose Veins Neg Hx    Allergies  Allergen Reactions  . Neosporin [Neomycin-Bacitracin Zn-Polymyx] Rash  . Sulfa Antibiotics Rash     Previous Medications   IBUPROFEN (ADVIL,MOTRIN) 600 MG TABLET    Take 1 tablet (600 mg total) by mouth every 6 (six) hours.   LEVOTHYROXINE (SYNTHROID, LEVOTHROID) 50 MCG TABLET    Take 1 tablet (50 mcg total) by mouth daily.   SERTRALINE (ZOLOFT) 50 MG TABLET    TAKE 1 AND 1/2 TABLETS BY MOUTH DAILY.    Review of Systems  Constitutional: Negative.   Respiratory: Negative.   Cardiovascular: Negative.   Gastrointestinal: Positive for blood in stool and hematochezia.    Social History  Substance Use Topics  . Smoking status: Never Smoker  . Smokeless tobacco: Never Used  . Alcohol use Yes     Comment: rarely   Objective:   BP 110/72 (BP Location: Right Arm, Patient Position: Sitting, Cuff Size: Normal)   Pulse 73   Temp 98.2 F (36.8 C) (Oral)  Resp 14   Wt 127 lb 3.2 oz (57.7 kg)   SpO2 98%   BMI 21.17 kg/m   Physical Exam  Constitutional: She is oriented to person, place, and time. She appears well-developed and well-nourished. No distress.  HENT:  Head: Normocephalic and atraumatic.  Right Ear: Hearing normal.  Left Ear: Hearing normal.  Nose: Nose normal.  Eyes: Conjunctivae and lids are normal. Right eye exhibits no discharge. Left eye exhibits no discharge. No scleral icterus.  Cardiovascular: Normal rate.   Pulmonary/Chest: Effort normal. No respiratory distress.  Abdominal: Soft. Bowel sounds are normal.  Genitourinary:  Genitourinary Comments: One small reddish spot at 2 o'clock position internally with anoscope exam. Guaiac positive but no active bleeding. No pain or stinging.  Musculoskeletal: Normal range of motion.  Neurological: She is alert and oriented to person, place, and time.  Skin: Skin is intact. No lesion  and no rash noted.  Psychiatric: She has a normal mood and affect. Her speech is normal and behavior is normal. Thought content normal.      Assessment & Plan:     1. Internal hemorrhoid, bleeding Had a soft stool with a clot passed with BM a couple times today. Bright red blood on tissue but no pain or stinging. No thrombosed hemorrhoids on DRE or anoscopic exam. Will treat with Anusol-HC suppositories BID for 5-6 days. Recheck prn. - hydrocortisone (ANUSOL-HC) 25 MG suppository; Place 1 suppository (25 mg total) rectally 2 (two) times daily.  Dispense: 12 suppository; Refill: 0

## 2017-02-09 DIAGNOSIS — R591 Generalized enlarged lymph nodes: Secondary | ICD-10-CM | POA: Diagnosis not present

## 2017-03-09 ENCOUNTER — Telehealth: Payer: Self-pay | Admitting: Family Medicine

## 2017-03-09 DIAGNOSIS — K625 Hemorrhage of anus and rectum: Secondary | ICD-10-CM

## 2017-03-09 NOTE — Telephone Encounter (Signed)
Pt states she was seen several months ago for rectal bleeding and she is no longer bleeding.  Pt states she just found out that her dad had a colon polyp removed and the polyp was cancer.  Pt wanted to advise. TR#[id number]/[id number]

## 2017-03-09 NOTE — Telephone Encounter (Signed)
Please review and advise.

## 2017-03-12 ENCOUNTER — Other Ambulatory Visit: Payer: Self-pay

## 2017-03-12 DIAGNOSIS — Z8371 Family history of colonic polyps: Secondary | ICD-10-CM

## 2017-03-12 NOTE — Telephone Encounter (Signed)
Pt advised; and agreed to the referral.  Please set up the referral.   Thanks,   -Mickel Baas

## 2017-03-12 NOTE — Telephone Encounter (Signed)
Okay to refer to GI for patient peace of mind.

## 2017-03-12 NOTE — Telephone Encounter (Signed)
LMTCB to tell patient of advise to see GI for peace of mind.  Order has been put into chart. ED

## 2017-03-28 ENCOUNTER — Other Ambulatory Visit: Payer: Self-pay | Admitting: Family Medicine

## 2017-03-30 ENCOUNTER — Telehealth: Payer: Self-pay | Admitting: Gastroenterology

## 2017-03-30 ENCOUNTER — Other Ambulatory Visit: Payer: Self-pay

## 2017-03-30 DIAGNOSIS — Z8371 Family history of colonic polyps: Secondary | ICD-10-CM

## 2017-03-30 NOTE — Telephone Encounter (Signed)
Patient is returning your call to schedule

## 2017-03-30 NOTE — Progress Notes (Signed)
Gastroenterology Pre-Procedure Review  Request Date: Thursday May 21st 2018 Requesting Physician: Dr. Vicente Males  PATIENT REVIEW QUESTIONS: The patient responded to the following health history questions as indicated:    1. Are you having any GI issues? yes (shooting pain in stomach) 2. Do you have a personal history of Polyps? no 3. Do you have a family history of Colon Cancer or Polyps? yes (dad polyp) 4. Diabetes Mellitus? no 5. Joint replacements in the past 12 months?no 6. Major health problems in the past 3 months?no 7. Any artificial heart valves, MVP, or defibrillator?no    MEDICATIONS & ALLERGIES:    Patient reports the following regarding taking any anticoagulation/antiplatelet therapy:   Plavix, Coumadin, Eliquis, Xarelto, Lovenox, Pradaxa, Brilinta, or Effient? no Aspirin? no  Patient confirms/reports the following medications:  Current Outpatient Prescriptions  Medication Sig Dispense Refill  . hydrocortisone (ANUSOL-HC) 25 MG suppository Place 1 suppository (25 mg total) rectally 2 (two) times daily. 12 suppository 0  . ibuprofen (ADVIL,MOTRIN) 600 MG tablet Take 1 tablet (600 mg total) by mouth every 6 (six) hours. 30 tablet 0  . levothyroxine (SYNTHROID, LEVOTHROID) 50 MCG tablet TAKE 1 TABLET (50 MCG TOTAL) BY MOUTH DAILY. 30 tablet 11  . sertraline (ZOLOFT) 50 MG tablet TAKE 1 AND 1/2 TABLETS BY MOUTH DAILY. 45 tablet 11   No current facility-administered medications for this visit.     Patient confirms/reports the following allergies:  Allergies  Allergen Reactions  . Neosporin [Neomycin-Bacitracin Zn-Polymyx] Rash  . Sulfa Antibiotics Rash    Orders Placed This Encounter  Procedures  . Procedural/ Surgical Case Request: COLONOSCOPY WITH PROPOFOL    Standing Status:   Standing    Number of Occurrences:   1    Order Specific Question:   Pre-op diagnosis    Answer:   z83.71 family history of polyps    Order Specific Question:   CPT Code    Answer:   [phone]     AUTHORIZATION INFORMATION Primary Insurance: 1D#: Group #:  Secondary Insurance: 1D#: Group #:  SCHEDULE INFORMATION: Date: 04/23/17 [place]  Time: Location:

## 2017-03-31 ENCOUNTER — Other Ambulatory Visit: Payer: Self-pay | Admitting: Family Medicine

## 2017-03-31 DIAGNOSIS — F419 Anxiety disorder, unspecified: Secondary | ICD-10-CM

## 2017-03-31 MED ORDER — SERTRALINE HCL 50 MG PO TABS: 75.0000 mg | ORAL_TABLET | Freq: Every day | ORAL | 3 refills | Status: DC

## 2017-03-31 NOTE — Telephone Encounter (Signed)
Brandi [name]

## 2017-03-31 NOTE — Telephone Encounter (Signed)
[facility] pharmacy faxed a 90-days supply for the following medication.  Thanks CC  sertraline (ZOLOFT) 50 MG tablet  Take 1 and 1/2 tablets by mouth daily.

## 2017-03-31 NOTE — Telephone Encounter (Signed)
Spoke with pt yesterday and scheduled.

## 2017-04-01 ENCOUNTER — Telehealth: Payer: Self-pay | Admitting: Gastroenterology

## 2017-04-01 NOTE — Telephone Encounter (Signed)
04/01/17 Spoke with Margaretmary Dys at [facility] and NO prior auth required for Colonoscopy [phone] / Z83.71

## 2017-04-21 ENCOUNTER — Other Ambulatory Visit: Payer: Self-pay

## 2017-04-21 ENCOUNTER — Telehealth: Payer: Self-pay | Admitting: Gastroenterology

## 2017-04-21 DIAGNOSIS — Z800 Family history of malignant neoplasm of digestive organs: Secondary | ICD-10-CM

## 2017-04-21 NOTE — Telephone Encounter (Signed)
Patient Brandi [name] and has come down with a chest cold/bronchitis and is on Amoxicillin. She wants to know if she can still have her procedure on Thursday 04/23/17?

## 2017-04-23 ENCOUNTER — Encounter: Payer: Self-pay | Admitting: Physician Assistant

## 2017-04-23 ENCOUNTER — Encounter: Payer: Self-pay | Admitting: *Deleted

## 2017-04-23 ENCOUNTER — Ambulatory Visit (INDEPENDENT_AMBULATORY_CARE_PROVIDER_SITE_OTHER): Payer: BLUE CROSS/BLUE SHIELD | Admitting: Physician Assistant

## 2017-04-23 ENCOUNTER — Encounter: Admission: RE | Payer: Self-pay | Source: Ambulatory Visit

## 2017-04-23 ENCOUNTER — Ambulatory Visit
Admission: RE | Admit: 2017-04-23 | Payer: BLUE CROSS/BLUE SHIELD | Source: Ambulatory Visit | Admitting: Gastroenterology

## 2017-04-23 VITALS — BP 110/60 | HR 58 | Temp 98.8°F | Resp 16 | Wt 132.0 lb

## 2017-04-23 DIAGNOSIS — R05 Cough: Secondary | ICD-10-CM | POA: Diagnosis not present

## 2017-04-23 DIAGNOSIS — R059 Cough, unspecified: Secondary | ICD-10-CM

## 2017-04-23 DIAGNOSIS — J069 Acute upper respiratory infection, unspecified: Secondary | ICD-10-CM

## 2017-04-23 SURGERY — COLONOSCOPY WITH PROPOFOL
Anesthesia: General

## 2017-04-23 MED ORDER — AMOXICILLIN-POT CLAVULANATE 875-125 MG PO TABS: 1.0000 | ORAL_TABLET | Freq: Two times a day (BID) | ORAL | 0 refills | Status: AC

## 2017-04-23 NOTE — Progress Notes (Signed)
Patient: Brandi [name] Female    DOB: 1981-09-29   36 y.o.   MRN: [id number] Visit Date: 04/23/2017  Today's Provider: Trinna Post, PA-C   Chief Complaint  Patient presents with  . Cough   Subjective:    HPI   Brandi [name] is a 36 y/o woman with upcoming colonoscopy who comes in today c/o cough and congestion ongoing for one month. She says two weeks ago, she initially tried a Z-pack. She had this "around." It did not work. Then, when [place] was at the beach most recently, she went to an urgent care and was prescribed Augmentin and Claritin. She has been taking Augmentin but not claritin. She c/o nonproductive cough today and some chest pain with coughing.She is not having fevers, chills, nausea, vomiting.   She is worried because she had a colonoscopy today which was rescheduled due to her illness. She was told this needed to be cleared up. She was also told to take the full course of her antibiotics. Unfortunately, one of her family members took a couple days of her antibiotics, so she only has 8 days worth.     Allergies  Allergen Reactions  . Adhesive [Tape] Rash    Some bandaids  . Neosporin [Neomycin-Bacitracin Zn-Polymyx] Rash  . Sulfa Antibiotics Rash     Current Outpatient Prescriptions:  .  amoxicillin-clavulanate (AUGMENTIN) 875-125 MG tablet, Take 1 tablet by mouth 2 (two) times daily., Disp: , Rfl:  .  ibuprofen (ADVIL,MOTRIN) 600 MG tablet, Take 1 tablet (600 mg total) by mouth every 6 (six) hours., Disp: 30 tablet, Rfl: 0 .  levothyroxine (SYNTHROID, LEVOTHROID) 50 MCG tablet, TAKE 1 TABLET (50 MCG TOTAL) BY MOUTH DAILY., Disp: 30 tablet, Rfl: 11 .  loratadine (CLARITIN) 10 MG tablet, Take 10 mg by mouth daily as needed for allergies., Disp: , Rfl:  .  sertraline (ZOLOFT) 50 MG tablet, Take 1.5 tablets (75 mg total) by mouth daily., Disp: 135 tablet, Rfl: 3 .  SUMAtriptan (IMITREX) 50 MG tablet, Take 50 mg by mouth every 2 (two) hours as needed for  migraine. May repeat in 2 hours if headache persists or recurs., Disp: , Rfl:  .  hydrocortisone (ANUSOL-HC) 25 MG suppository, Place 1 suppository (25 mg total) rectally 2 (two) times daily. (Patient not taking: Reported on 04/23/2017), Disp: 12 suppository, Rfl: 0  Review of Systems  Constitutional: Positive for activity change and fatigue.  HENT: Positive for congestion and postnasal drip.   Respiratory: Positive for cough.   Allergic/Immunologic: Positive for environmental allergies.    Social History  Substance Use Topics  . Smoking status: Never Smoker  . Smokeless tobacco: Never Used  . Alcohol use 2.4 oz/week    4 Glasses of wine per week     Comment: rarely   Objective:   BP 110/60   Pulse (!) 58   Temp 98.8 F (37.1 C)   Resp 16   Wt 132 lb (59.9 kg)   LMP 04/21/2017   SpO2 98%   BMI 21.31 kg/m  Vitals:   04/23/17 1610  BP: 110/60  Pulse: (!) 58  Resp: 16  Temp: 98.8 F (37.1 C)  SpO2: 98%  Weight: 132 lb (59.9 kg)     Physical Exam  Constitutional: She is oriented to person, place, and time. She appears well-developed and well-nourished. No distress.  HENT:  Right Ear: External ear normal.  Left Ear: External ear normal.  Mouth/Throat: Oropharynx is clear and moist.  No oropharyngeal exudate.  Cardiovascular: Normal rate and regular rhythm.   Pulmonary/Chest: Effort normal and breath sounds normal.  Neurological: She is alert and oriented to person, place, and time.  Skin: Skin is warm and dry.  Psychiatric: She has a normal mood and affect. Her behavior is normal.        Assessment & Plan:     1. Upper respiratory infection with cough and congestion  Think it's combination of virus and allergies. Lungs are clear, oxygenation 97%. Offered CXR, patient declines. Will give two more days to complete her 10 day course. Please take this medication and do not share. She may call back next week if she decides to proceed with CXR,   -  amoxicillin-clavulanate (AUGMENTIN) 875-125 MG tablet; Take 1 tablet by mouth 2 (two) times daily.  Dispense: 4 tablet; Refill: 0  2. Cough  See above.  Return if symptoms worsen or fail to improve.  The entirety of the information documented in the History of Present Illness, Review of Systems and Physical Exam were personally obtained by me. Portions of this information were initially documented by Wilburt Finlay, CMA and reviewed by me for thoroughness and accuracy.             Trinna Post, PA-C  [facility]

## 2017-04-23 NOTE — Patient Instructions (Signed)

## 2017-04-24 ENCOUNTER — Ambulatory Visit: Payer: BLUE CROSS/BLUE SHIELD | Admitting: Family Medicine

## 2017-04-29 DIAGNOSIS — Z01419 Encounter for gynecological examination (general) (routine) without abnormal findings: Secondary | ICD-10-CM | POA: Diagnosis not present

## 2017-04-29 DIAGNOSIS — Z6820 Body mass index (BMI) 20.0-20.9, adult: Secondary | ICD-10-CM | POA: Diagnosis not present

## 2017-04-29 DIAGNOSIS — Z1389 Encounter for screening for other disorder: Secondary | ICD-10-CM | POA: Diagnosis not present

## 2017-04-29 DIAGNOSIS — Z130 Encounter for screening for diseases of the blood and blood-forming organs and certain disorders involving the immune mechanism: Secondary | ICD-10-CM | POA: Diagnosis not present

## 2017-04-29 NOTE — Discharge Instructions (Signed)
General Anesthesia, Adult, Care After °These instructions provide you with information about caring for yourself after your procedure. Your health care provider may also give you more specific instructions. Your treatment has been planned according to current medical practices, but problems sometimes occur. Call your health care provider if you have any problems or questions after your procedure. °What can I expect after the procedure? °After the procedure, it is common to have: °· Vomiting. °· A sore throat. °· Mental slowness. ° °It is common to feel: °· Nauseous. °· Cold or shivery. °· Sleepy. °· Tired. °· Sore or achy, even in parts of your body where you did not have surgery. ° °Follow these instructions at home: °For at least 24 hours after the procedure: °· Do not: °? Participate in activities where you could fall or become injured. °? Drive. °? Use heavy machinery. °? Drink alcohol. °? Take sleeping pills or medicines that cause drowsiness. °? Make important decisions or sign legal documents. °? Take care of children on your own. °· Rest. °Eating and drinking °· If you vomit, drink water, juice, or soup when you can drink without vomiting. °· Drink enough fluid to keep your urine clear or pale yellow. °· Make sure you have little or no nausea before eating solid foods. °· Follow the diet recommended by your health care provider. °General instructions °· Have a responsible adult stay with you until you are awake and alert. °· Return to your normal activities as told by your health care provider. Ask your health care provider what activities are safe for you. °· Take over-the-counter and prescription medicines only as told by your health care provider. °· If you smoke, do not smoke without supervision. °· Keep all follow-up visits as told by your health care provider. This is important. °Contact a health care provider if: °· You continue to have nausea or vomiting at home, and medicines are not helpful. °· You  cannot drink fluids or start eating again. °· You cannot urinate after 8-12 hours. °· You develop a skin rash. °· You have fever. °· You have increasing redness at the site of your procedure. °Get help right away if: °· You have difficulty breathing. °· You have chest pain. °· You have unexpected bleeding. °· You feel that you are having a life-threatening or urgent problem. °This information is not intended to replace advice given to you by your health care provider. Make sure you discuss any questions you have with your health care provider. °Document Released: 02/16/2001 Document Revised: 04/14/2016 Document Reviewed: 10/25/2015 °Elsevier Interactive Patient Education © 2018 [employer] ° °

## 2017-04-30 ENCOUNTER — Ambulatory Visit: Payer: BLUE CROSS/BLUE SHIELD | Admitting: Anesthesiology

## 2017-04-30 ENCOUNTER — Ambulatory Visit
Admission: RE | Admit: 2017-04-30 | Discharge: 2017-04-30 | Disposition: A | Discharge status: Home / Self Care | Payer: BLUE CROSS/BLUE SHIELD | Source: Ambulatory Visit | Attending: Gastroenterology | Admitting: Gastroenterology

## 2017-04-30 ENCOUNTER — Encounter
Admission: RE | Disposition: A | Discharge status: Home / Self Care | Payer: Self-pay | Source: Ambulatory Visit | Attending: Gastroenterology

## 2017-04-30 DIAGNOSIS — Z800 Family history of malignant neoplasm of digestive organs: Secondary | ICD-10-CM

## 2017-04-30 DIAGNOSIS — J45909 Unspecified asthma, uncomplicated: Secondary | ICD-10-CM | POA: Insufficient documentation

## 2017-04-30 DIAGNOSIS — Z803 Family history of malignant neoplasm of breast: Secondary | ICD-10-CM | POA: Diagnosis not present

## 2017-04-30 DIAGNOSIS — Z820 Family history of epilepsy and other diseases of the nervous system: Secondary | ICD-10-CM | POA: Diagnosis not present

## 2017-04-30 DIAGNOSIS — Z882 Allergy status to sulfonamides status: Secondary | ICD-10-CM | POA: Insufficient documentation

## 2017-04-30 DIAGNOSIS — Z818 Family history of other mental and behavioral disorders: Secondary | ICD-10-CM | POA: Insufficient documentation

## 2017-04-30 DIAGNOSIS — Z823 Family history of stroke: Secondary | ICD-10-CM | POA: Insufficient documentation

## 2017-04-30 DIAGNOSIS — G43909 Migraine, unspecified, not intractable, without status migrainosus: Secondary | ICD-10-CM | POA: Insufficient documentation

## 2017-04-30 DIAGNOSIS — Z881 Allergy status to other antibiotic agents status: Secondary | ICD-10-CM | POA: Insufficient documentation

## 2017-04-30 DIAGNOSIS — Z808 Family history of malignant neoplasm of other organs or systems: Secondary | ICD-10-CM | POA: Insufficient documentation

## 2017-04-30 DIAGNOSIS — Z79899 Other long term (current) drug therapy: Secondary | ICD-10-CM | POA: Diagnosis not present

## 2017-04-30 DIAGNOSIS — Z8371 Family history of colonic polyps: Secondary | ICD-10-CM

## 2017-04-30 DIAGNOSIS — Z1211 Encounter for screening for malignant neoplasm of colon: Secondary | ICD-10-CM | POA: Diagnosis not present

## 2017-04-30 DIAGNOSIS — Z8249 Family history of ischemic heart disease and other diseases of the circulatory system: Secondary | ICD-10-CM | POA: Insufficient documentation

## 2017-04-30 DIAGNOSIS — Z8049 Family history of malignant neoplasm of other genital organs: Secondary | ICD-10-CM | POA: Insufficient documentation

## 2017-04-30 DIAGNOSIS — E039 Hypothyroidism, unspecified: Secondary | ICD-10-CM | POA: Insufficient documentation

## 2017-04-30 HISTORY — DX: Bronchitis, not specified as acute or chronic: J40

## 2017-04-30 HISTORY — DX: Migraine, unspecified, not intractable, without status migrainosus: G43.909

## 2017-04-30 HISTORY — DX: Motion sickness, initial encounter: T75.3XXA

## 2017-04-30 HISTORY — PX: COLONOSCOPY WITH PROPOFOL: SHX5780

## 2017-04-30 SURGERY — COLONOSCOPY WITH PROPOFOL
Anesthesia: General | Site: Rectum | Wound class: Dirty or Infected

## 2017-04-30 MED ORDER — PROPOFOL 10 MG/ML IV BOLUS
INTRAVENOUS | Status: DC | PRN
  Administered 2017-04-30: 50 mg via INTRAVENOUS
  Administered 2017-04-30: 20 mg via INTRAVENOUS
  Administered 2017-04-30: 50 mg via INTRAVENOUS
  Administered 2017-04-30: 10 mg via INTRAVENOUS
  Administered 2017-04-30 (×2): 50 mg via INTRAVENOUS

## 2017-04-30 MED ORDER — LIDOCAINE HCL (CARDIAC) 20 MG/ML IV SOLN
INTRAVENOUS | Status: DC | PRN
  Administered 2017-04-30: 50 mg via INTRAVENOUS

## 2017-04-30 MED ORDER — ACETAMINOPHEN 160 MG/5ML PO SOLN: 325.0000 mg | ORAL | Status: DC | PRN

## 2017-04-30 MED ORDER — SODIUM CHLORIDE 0.9 % IV SOLN: INTRAVENOUS | Status: DC

## 2017-04-30 MED ORDER — ACETAMINOPHEN 325 MG PO TABS: 325.0000 mg | ORAL_TABLET | ORAL | Status: DC | PRN

## 2017-04-30 MED ORDER — LACTATED RINGERS IV SOLN
INTRAVENOUS | Status: DC
  Administered 2017-04-30: 09:00:00 via INTRAVENOUS

## 2017-04-30 MED ORDER — SIMETHICONE 40 MG/0.6ML PO SUSP
ORAL | Status: DC | PRN
  Administered 2017-04-30: 10:00:00

## 2017-04-30 SURGICAL SUPPLY — 23 items

## 2017-04-30 NOTE — Anesthesia Preprocedure Evaluation (Signed)
Anesthesia Evaluation  Patient identified by MRN, date of birth, ID band Patient awake    Reviewed: Allergy & Precautions, H&P , NPO status , Patient's Chart, lab work & pertinent test results  Airway Mallampati: II  TM Distance: >3 FB Neck ROM: full    Dental no notable dental hx.    Pulmonary asthma ,    Pulmonary exam normal        Cardiovascular Normal cardiovascular exam     Neuro/Psych    GI/Hepatic   Endo/Other  Hypothyroidism   Renal/GU      Musculoskeletal   Abdominal   Peds  Hematology   Anesthesia Other Findings   Reproductive/Obstetrics                             Anesthesia Physical Anesthesia Plan  ASA: II  Anesthesia Plan: General   Post-op Pain Management:    Induction:   PONV Risk Score and Plan: 3 and Propofol  Airway Management Planned:   Additional Equipment:   Intra-op Plan:   Post-operative Plan:   Informed Consent: I have reviewed the patients History and Physical, chart, labs and discussed the procedure including the risks, benefits and alternatives for the proposed anesthesia with the patient or authorized representative who has indicated his/her understanding and acceptance.     Plan Discussed with:   Anesthesia Plan Comments:         Anesthesia Quick Evaluation

## 2017-04-30 NOTE — H&P (Signed)
Lucilla Lame, MD [address] [place], [place] [address] [phone] Fax : [phone]  Primary Care Physician:  Jerrol Banana., MD Primary Gastroenterologist:  Dr. Allen Norris  Pre-Procedure History & Physical: HPI:  Brandi [name] is a 36 y.o. female is here for an colonoscopy.   Past Medical History:  Diagnosis Date  . Bronchitis    for last month.  started antibiotic 04/19/17  . Hypothyroidism   . Migraine headache    "seems r/t menstrual cycle"  . Motion sickness    all vehicles    Past Surgical History:  Procedure Laterality Date  . CESAREAN SECTION  04/2010  . CESAREAN SECTION N/A 01/10/2015   Procedure: CESAREAN SECTION;  Surgeon: Melina Schools, MD;  Location: [facility];  Service: Obstetrics;  Laterality: N/A;  . COLONOSCOPY    . laprascopic     exploratory  . OTHER SURGICAL HISTORY  2015   IVF egg retrieval    Prior to Admission medications   Medication Sig Start Date End Date Taking? Authorizing Provider  ibuprofen (ADVIL,MOTRIN) 600 MG tablet Take 1 tablet (600 mg total) by mouth every 6 (six) hours. 01/13/15  Yes Meisinger, Todd, MD  levothyroxine (SYNTHROID, LEVOTHROID) 50 MCG tablet TAKE 1 TABLET (50 MCG TOTAL) BY MOUTH DAILY. 03/28/17  Yes Jerrol Banana., MD  loratadine (CLARITIN) 10 MG tablet Take 10 mg by mouth daily as needed for allergies.   Yes [provider]  sertraline (ZOLOFT) 50 MG tablet Take 1.5 tablets (75 mg total) by mouth daily. 03/31/17  Yes Jerrol Banana., MD  SUMAtriptan (IMITREX) 50 MG tablet Take 50 mg by mouth every 2 (two) hours as needed for migraine. May repeat in 2 hours if headache persists or recurs.   Yes [provider]  amoxicillin-clavulanate (AUGMENTIN) 875-125 MG tablet Take 1 tablet by mouth 2 (two) times daily. 04/19/17   [provider]  hydrocortisone (ANUSOL-HC) 25 MG suppository Place 1 suppository (25 mg total) rectally 2 (two) times daily. Patient not  taking: Reported on 04/23/2017 01/05/17   Chrismon, Vickki Muff, PA    Allergies as of 04/21/2017 - Review Complete 01/05/2017  Allergen Reaction Noted  . Neosporin [neomycin-bacitracin zn-polymyx] Rash 01/10/2015  . Sulfa antibiotics Rash 01/10/2015    Family History  Problem Relation Age of Onset  . Migraines Mother   . Cancer Mother        breast  . Hyperlipidemia Mother   . Anxiety disorder Mother 25  . Breast cancer Mother 62  . Hyperlipidemia Father   . Cancer Father        melanoma  . Cancer Paternal Uncle        brain tumor  . Cancer Maternal Grandmother        breast and uterine  . Dementia Maternal Grandmother   . Breast cancer Maternal Grandmother 31  . Stroke Maternal Grandfather   . Aneurysm Paternal Grandfather   . Alcohol abuse Neg Hx   . Arthritis Neg Hx   . Asthma Neg Hx   . Birth defects Neg Hx   . COPD Neg Hx   . Depression Neg Hx   . Diabetes Neg Hx   . Drug abuse Neg Hx   . Early death Neg Hx   . Hearing loss Neg Hx   . Heart disease Neg Hx   . Hypertension Neg Hx   . Kidney disease Neg Hx   . Learning disabilities Neg Hx   .  Mental illness Neg Hx   . Mental retardation Neg Hx   . Miscarriages / Stillbirths Neg Hx   . Vision loss Neg Hx   . Varicose Veins Neg Hx     Social History   Social History  . Marital status: Married    Spouse name: N/A  . Number of children: N/A  . Years of education: N/A   Occupational History  . Not on file.   Social History Main Topics  . Smoking status: Never Smoker  . Smokeless tobacco: Never Used  . Alcohol use 2.4 oz/week    4 Glasses of wine per week     Comment: rarely  . Drug use: No  . Sexual activity: Yes   Other Topics Concern  . Not on file   Social History Narrative  . No narrative on file    Review of Systems: See HPI, otherwise negative ROS  Physical Exam: BP 93/60   Pulse 67   Temp 98.2 F (36.8 C) (Tympanic)   Resp 16   Ht 5\' 6"  (1.676 m)   Wt 126 lb (57.2 kg)   LMP  04/21/2017   SpO2 99%   BMI 20.34 kg/m  General:   Alert,  pleasant and cooperative in NAD Head:  Normocephalic and atraumatic. Neck:  Supple; no masses or thyromegaly. Lungs:  Clear throughout to auscultation.    Heart:  Regular rate and rhythm. Abdomen:  Soft, nontender and nondistended. Normal bowel sounds, without guarding, and without rebound.   Neurologic:  Alert and  oriented x4;  grossly normal neurologically.  Impression/Plan: Brandi [name] is here for an colonoscopy to be performed for family history of polyps  Risks, benefits, limitations, and alternatives regarding  colonoscopy have been reviewed with the patient.  Questions have been answered.  All parties agreeable.   Lucilla Lame, MD  04/30/2017, 8:43 AM

## 2017-04-30 NOTE — Anesthesia Procedure Notes (Addendum)
Performed by: Mishelle Hassan Pre-anesthesia Checklist: Patient identified, Emergency Drugs available, Suction available, Timeout performed and Patient being monitored Patient Re-evaluated:Patient Re-evaluated prior to induction Oxygen Delivery Method: Nasal cannula Placement Confirmation: positive ETCO2       

## 2017-04-30 NOTE — Anesthesia Postprocedure Evaluation (Signed)
Anesthesia Post Note  Patient: Brandi [name]  Procedure(s) Performed: Procedure(s) (LRB): COLONOSCOPY WITH PROPOFOL (N/A)  Patient location during evaluation: PACU Anesthesia Type: General Level of consciousness: awake and alert and oriented Pain management: satisfactory to patient Vital Signs Assessment: post-procedure vital signs reviewed and stable Respiratory status: spontaneous breathing, nonlabored ventilation and respiratory function stable Cardiovascular status: blood pressure returned to baseline and stable Postop Assessment: Adequate PO intake and No signs of nausea or vomiting Anesthetic complications: no    Raliegh Ip

## 2017-04-30 NOTE — Op Note (Signed)
[facility] Patient Name: Brandi [name] Procedure Date: 04/30/2017 9:30 AM MRN: [id number] Account #: [id number] Date of Birth: 1981-02-28 Admit Type: Outpatient Age: 36 Room: [facility] OR ROOM 01 Gender: Female Note Status: Finalized Procedure:            Colonoscopy Indications:          Family history of colonic polyps in a first-degree                        relative Providers:            Lucilla Lame MD, MD Referring MD:         Janine Ores. Rosanna Randy, MD (Referring MD) Medicines:            Propofol per Anesthesia Complications:        No immediate complications. Procedure:            Pre-Anesthesia Assessment:                       - Prior to the procedure, a History and Physical was                        performed, and patient medications and allergies were                        reviewed. The patient's tolerance of previous                        anesthesia was also reviewed. The risks and benefits of                        the procedure and the sedation options and risks were                        discussed with the patient. All questions were                        answered, and informed consent was obtained. Prior                        Anticoagulants: The patient has taken no previous                        anticoagulant or antiplatelet agents. ASA Grade                        Assessment: II - A patient with mild systemic disease.                        After reviewing the risks and benefits, the patient was                        deemed in satisfactory condition to undergo the                        procedure.                       After obtaining informed consent, the colonoscope was  passed under direct vision. Throughout the procedure,                        the patient's blood pressure, pulse, and oxygen                        saturations were monitored continuously. The Olympus                        Colonoscope  [phone]) was introduced through the                        anus and advanced to the the cecum, identified by                        appendiceal orifice and ileocecal valve. The                        colonoscopy was performed without difficulty. The                        patient tolerated the procedure well. The quality of                        the bowel preparation was excellent. Findings:      The perianal and digital rectal examinations were normal.      The colon (entire examined portion) appeared normal. Impression:           - The entire examined colon is normal.                       - No specimens collected. Recommendation:       - Discharge patient to home.                       - Resume previous diet.                       - Continue present medications.                       - Repeat colonoscopy in 5 years for surveillance. Procedure Code(s):    --- Professional ---                       [phone], Colonoscopy, flexible; diagnostic, including                        collection of specimen(s) by brushing or washing, when                        performed (separate procedure) Diagnosis Code(s):    --- Professional ---                       Z83.71, Family history of colonic polyps CPT copyright 2016 American Medical Association. All rights reserved. The codes documented in this report are preliminary and upon coder review may  be revised to meet current compliance requirements. Lucilla Lame MD, MD 04/30/2017 9:50:51 AM This report has been signed electronically. Number of Addenda: 0 Note Initiated On: 04/30/2017 9:30 AM Scope Withdrawal Time: 0 hours 8 minutes 44 seconds  Total Procedure Duration: 0 hours 12 minutes 54 seconds       [facility]

## 2017-04-30 NOTE — Transfer of Care (Signed)
Immediate Anesthesia Transfer of Care Note  Patient: Brandi [name]  Procedure(s) Performed: Procedure(s): COLONOSCOPY WITH PROPOFOL (N/A)  Patient Location: PACU  Anesthesia Type: General  Level of Consciousness: awake, alert  and patient cooperative  Airway and Oxygen Therapy: Patient Spontanous Breathing and Patient connected to supplemental oxygen  Post-op Assessment: Post-op Vital signs reviewed, Patient's Cardiovascular Status Stable, Respiratory Function Stable, Patent Airway and No signs of Nausea or vomiting  Post-op Vital Signs: Reviewed and stable  Complications: No apparent anesthesia complications

## 2017-05-04 DIAGNOSIS — D225 Melanocytic nevi of trunk: Secondary | ICD-10-CM | POA: Diagnosis not present

## 2017-05-08 ENCOUNTER — Encounter: Payer: Self-pay | Admitting: Gastroenterology

## 2017-05-08 NOTE — Addendum Note (Signed)
Addendum  created 05/08/17 0717 by Londell Moh, CRNA   Anesthesia Intra Blocks edited, Sign clinical note

## 2017-06-04 ENCOUNTER — Other Ambulatory Visit: Payer: Self-pay | Admitting: Family Medicine

## 2017-06-04 DIAGNOSIS — J019 Acute sinusitis, unspecified: Secondary | ICD-10-CM

## 2017-06-04 DIAGNOSIS — J302 Other seasonal allergic rhinitis: Secondary | ICD-10-CM

## 2017-08-13 ENCOUNTER — Encounter: Payer: Self-pay | Admitting: Family Medicine

## 2017-10-30 DIAGNOSIS — M722 Plantar fascial fibromatosis: Secondary | ICD-10-CM | POA: Diagnosis not present

## 2017-11-16 ENCOUNTER — Ambulatory Visit: Payer: BLUE CROSS/BLUE SHIELD | Admitting: Family Medicine

## 2017-11-16 ENCOUNTER — Encounter: Payer: Self-pay | Admitting: Family Medicine

## 2017-11-16 VITALS — BP 108/72 | HR 80 | Temp 98.1°F | Resp 16 | Wt 141.0 lb

## 2017-11-16 DIAGNOSIS — J069 Acute upper respiratory infection, unspecified: Secondary | ICD-10-CM | POA: Diagnosis not present

## 2017-11-16 LAB — POCT INFLUENZA A/B
INFLUENZA A, POC: NEGATIVE
INFLUENZA B, POC: NEGATIVE

## 2017-11-16 MED ORDER — HYDROCODONE-HOMATROPINE 5-1.5 MG/5ML PO SYRP: 5.0000 mL | ORAL_SOLUTION | Freq: Three times a day (TID) | ORAL | 0 refills | Status: DC | PRN

## 2017-11-16 NOTE — Progress Notes (Signed)
Patient: Brandi [name] Female    DOB: 18-Jan-1981   36 y.o.   MRN: [id number] Visit Date: 11/16/2017  Today's Provider: Vernie Murders, PA   Chief Complaint  Patient presents with  . URI    Started about four days ago.    Subjective:    URI   This is a new problem. The current episode started in the past 7 days. Associated symptoms include congestion, coughing, diarrhea, a plugged ear sensation, a sore throat and wheezing. Pertinent negatives include no abdominal pain, ear pain, headaches, nausea, rhinorrhea, sinus pain or vomiting. Treatments tried: Z pak.   Past Medical History:  Diagnosis Date  . Bronchitis    for last month.  started antibiotic 04/19/17  . Hypothyroidism   . Migraine headache    "seems r/t menstrual cycle"  . Motion sickness    all vehicles   Past Surgical History:  Procedure Laterality Date  . CESAREAN SECTION  04/2010  . CESAREAN SECTION N/A 01/10/2015   Procedure: CESAREAN SECTION;  Surgeon: Melina Schools, MD;  Location: [facility];  Service: Obstetrics;  Laterality: N/A;  . COLONOSCOPY    . COLONOSCOPY WITH PROPOFOL N/A 04/30/2017   Procedure: COLONOSCOPY WITH PROPOFOL;  Surgeon: Lucilla Lame, MD;  Location: [place];  Service: Endoscopy;  Laterality: N/A;  . laprascopic     exploratory  . OTHER SURGICAL HISTORY  2015   IVF egg retrieval   Family History  Problem Relation Age of Onset  . Migraines Mother   . Cancer Mother        breast  . Hyperlipidemia Mother   . Anxiety disorder Mother 39  . Breast cancer Mother 48  . Hyperlipidemia Father   . Cancer Father        melanoma  . Cancer Paternal Uncle        brain tumor  . Cancer Maternal Grandmother        breast and uterine  . Dementia Maternal Grandmother   . Breast cancer Maternal Grandmother 47  . Stroke Maternal Grandfather   . Aneurysm Paternal Grandfather   . Alcohol abuse Neg Hx   . Arthritis Neg Hx   . Asthma Neg Hx   . Birth defects Neg Hx   . COPD Neg Hx     . Depression Neg Hx   . Diabetes Neg Hx   . Drug abuse Neg Hx   . Early death Neg Hx   . Hearing loss Neg Hx   . Heart disease Neg Hx   . Hypertension Neg Hx   . Kidney disease Neg Hx   . Learning disabilities Neg Hx   . Mental illness Neg Hx   . Mental retardation Neg Hx   . Miscarriages / Stillbirths Neg Hx   . Vision loss Neg Hx   . Varicose Veins Neg Hx       Allergies  Allergen Reactions  . Adhesive [Tape] Rash    Some bandaids  . Neosporin [Neomycin-Bacitracin Zn-Polymyx] Rash  . Sulfa Antibiotics Rash    Current Outpatient Medications:  .  ibuprofen (ADVIL,MOTRIN) 600 MG tablet, Take 1 tablet (600 mg total) by mouth every 6 (six) hours., Disp: 30 tablet, Rfl: 0 .  levothyroxine (SYNTHROID, LEVOTHROID) 50 MCG tablet, TAKE 1 TABLET (50 MCG TOTAL) BY MOUTH DAILY., Disp: 30 tablet, Rfl: 11 .  loratadine (CLARITIN) 10 MG tablet, Take 10 mg by mouth daily as needed for allergies., Disp: , Rfl:  .  loratadine (CLARITIN) 10 MG tablet, TAKE 1 TABLET (10 MG TOTAL) BY MOUTH DAILY., Disp: 30 tablet, Rfl: 11 .  sertraline (ZOLOFT) 50 MG tablet, Take 1.5 tablets (75 mg total) by mouth daily., Disp: 135 tablet, Rfl: 3 .  SUMAtriptan (IMITREX) 50 MG tablet, Take 50 mg by mouth every 2 (two) hours as needed for migraine. May repeat in 2 hours if headache persists or recurs., Disp: , Rfl:  .  amoxicillin-clavulanate (AUGMENTIN) 875-125 MG tablet, Take 1 tablet by mouth 2 (two) times daily., Disp: , Rfl:  .  hydrocortisone (ANUSOL-HC) 25 MG suppository, Place 1 suppository (25 mg total) rectally 2 (two) times daily. (Patient not taking: Reported on 04/23/2017), Disp: 12 suppository, Rfl: 0  Review of Systems  Constitutional: Positive for fatigue and fever (Highest was 101.5 ). Negative for activity change, appetite change, chills, diaphoresis and unexpected weight change.  HENT: Positive for congestion, sore throat and voice change. Negative for ear discharge, ear pain, rhinorrhea, sinus  pressure, sinus pain, tinnitus and trouble swallowing.   Eyes: Negative.   Respiratory: Positive for cough, chest tightness, shortness of breath and wheezing. Negative for apnea, choking and stridor.   Gastrointestinal: Positive for diarrhea. Negative for abdominal distention, abdominal pain, anal bleeding, blood in stool, constipation, nausea, rectal pain and vomiting.  Musculoskeletal: Positive for myalgias.  Neurological: Negative for dizziness, light-headedness and headaches.    Social History   Tobacco Use  . Smoking status: Never Smoker  . Smokeless tobacco: Never Used  Substance Use Topics  . Alcohol use: Yes    Alcohol/week: 2.4 oz    Types: 4 Glasses of wine per week    Comment: rarely   Objective:   BP 108/72 (BP Location: Right Arm, Patient Position: Sitting, Cuff Size: Normal)   Pulse 80   Temp 98.1 F (36.7 C) (Oral)   Resp 16   Wt 141 lb (64 kg)   LMP 11/02/2017   SpO2 98%   BMI 22.76 kg/m  Vitals:   11/16/17 1014  BP: 108/72  Pulse: 80  Resp: 16  Temp: 98.1 F (36.7 C)  TempSrc: Oral  SpO2: 98%  Weight: 141 lb (64 kg)    Physical Exam  Constitutional: She is oriented to person, place, and time. She appears well-developed and well-nourished. No distress.  HENT:  Head: Normocephalic and atraumatic.  Right Ear: Hearing and external ear normal.  Left Ear: Hearing and external ear normal.  Nose: Nose normal.  [place] posterior pharynx without exudates.  Eyes: Conjunctivae and lids are normal. Right eye exhibits no discharge. Left eye exhibits no discharge. No scleral icterus.  Neck: Neck supple.  Cardiovascular: Normal rate and regular rhythm.  Pulmonary/Chest: Effort normal and breath sounds normal. No respiratory distress.  Abdominal: Soft.  Musculoskeletal: Normal range of motion.  Neurological: She is alert and oriented to person, place, and time.  Skin: Skin is intact. No lesion and no rash noted.  Psychiatric: She has a normal mood and  affect. Her speech is normal and behavior is normal. Thought content normal.      Assessment & Plan:     1. URI with cough and congestion Onset 7 days ago at a [employer] trip. Has had cough with PND, sore throat and fever intermittently. May use Tylenol or Advil prn. Flu test negative. Started Z-pak 2 days ago. Will add Hycodan for cough, gargle with warm saltwater and home to rest. Increase fluid intake and recheck prn. - HYDROcodone-homatropine (HYCODAN) 5-1.5 MG/5ML syrup; Take 5  mLs by mouth every 8 (eight) hours as needed for cough.  Dispense: 120 mL; Refill: 0 - POCT Influenza A/B    Vernie Murders, PA  [facility]

## 2018-01-23 ENCOUNTER — Other Ambulatory Visit: Payer: Self-pay | Admitting: Family Medicine

## 2018-02-19 ENCOUNTER — Other Ambulatory Visit: Payer: Self-pay | Admitting: Family Medicine

## 2018-02-19 NOTE — Telephone Encounter (Signed)
Ok to rf for month but needs appt for f/u on thyroid.

## 2018-03-19 DIAGNOSIS — D2262 Melanocytic nevi of left upper limb, including shoulder: Secondary | ICD-10-CM | POA: Diagnosis not present

## 2018-03-19 DIAGNOSIS — D225 Melanocytic nevi of trunk: Secondary | ICD-10-CM | POA: Diagnosis not present

## 2018-03-19 DIAGNOSIS — D485 Neoplasm of uncertain behavior of skin: Secondary | ICD-10-CM | POA: Diagnosis not present

## 2018-03-19 DIAGNOSIS — D2261 Melanocytic nevi of right upper limb, including shoulder: Secondary | ICD-10-CM | POA: Diagnosis not present

## 2018-03-19 DIAGNOSIS — D2272 Melanocytic nevi of left lower limb, including hip: Secondary | ICD-10-CM | POA: Diagnosis not present

## 2018-03-26 ENCOUNTER — Other Ambulatory Visit: Payer: Self-pay | Admitting: Family Medicine

## 2018-04-25 ENCOUNTER — Other Ambulatory Visit: Payer: Self-pay | Admitting: Family Medicine

## 2018-04-25 DIAGNOSIS — J302 Other seasonal allergic rhinitis: Secondary | ICD-10-CM

## 2018-04-25 DIAGNOSIS — J019 Acute sinusitis, unspecified: Secondary | ICD-10-CM

## 2018-04-29 ENCOUNTER — Other Ambulatory Visit: Payer: Self-pay | Admitting: Family Medicine

## 2018-04-29 DIAGNOSIS — F419 Anxiety disorder, unspecified: Secondary | ICD-10-CM

## 2018-04-30 ENCOUNTER — Other Ambulatory Visit: Payer: Self-pay | Admitting: Family Medicine

## 2018-05-10 DIAGNOSIS — Z01419 Encounter for gynecological examination (general) (routine) without abnormal findings: Secondary | ICD-10-CM | POA: Diagnosis not present

## 2018-05-10 DIAGNOSIS — Z124 Encounter for screening for malignant neoplasm of cervix: Secondary | ICD-10-CM | POA: Diagnosis not present

## 2018-05-10 DIAGNOSIS — G43829 Menstrual migraine, not intractable, without status migrainosus: Secondary | ICD-10-CM | POA: Diagnosis not present

## 2018-05-10 DIAGNOSIS — Z1389 Encounter for screening for other disorder: Secondary | ICD-10-CM | POA: Diagnosis not present

## 2018-05-10 DIAGNOSIS — Z130 Encounter for screening for diseases of the blood and blood-forming organs and certain disorders involving the immune mechanism: Secondary | ICD-10-CM | POA: Diagnosis not present

## 2018-05-11 DIAGNOSIS — Z124 Encounter for screening for malignant neoplasm of cervix: Secondary | ICD-10-CM | POA: Diagnosis not present

## 2018-05-27 ENCOUNTER — Other Ambulatory Visit: Payer: Self-pay | Admitting: Family Medicine

## 2018-07-29 ENCOUNTER — Other Ambulatory Visit: Payer: Self-pay

## 2018-07-29 DIAGNOSIS — F419 Anxiety disorder, unspecified: Secondary | ICD-10-CM

## 2018-07-29 MED ORDER — LEVOTHYROXINE SODIUM 50 MCG PO TABS: 50.0000 ug | ORAL_TABLET | Freq: Every day | ORAL | 0 refills | Status: DC

## 2018-07-29 MED ORDER — SERTRALINE HCL 50 MG PO TABS: 75.0000 mg | ORAL_TABLET | Freq: Every day | ORAL | 0 refills | Status: DC

## 2018-07-29 NOTE — Telephone Encounter (Signed)
Patient requesting refills on the following medications: sertraline (ZOLOFT) 50 MG tablet  levothyroxine (SYNTHROID, LEVOTHROID) 50 MCG tablet  Pharmacy: [facility]

## 2018-08-02 ENCOUNTER — Other Ambulatory Visit: Payer: Self-pay | Admitting: Family Medicine

## 2018-08-02 NOTE — Telephone Encounter (Signed)
This was refilled on 07/29/18.

## 2018-08-02 NOTE — Telephone Encounter (Signed)
[facility] pharmacy faxed a refill request for the following medication. Thanks CC  levothyroxine (SYNTHROID, LEVOTHROID) 50 MCG tablet

## 2018-08-09 DIAGNOSIS — M5412 Radiculopathy, cervical region: Secondary | ICD-10-CM | POA: Insufficient documentation

## 2018-08-12 ENCOUNTER — Encounter: Payer: Self-pay | Admitting: Family Medicine

## 2018-08-12 ENCOUNTER — Ambulatory Visit (INDEPENDENT_AMBULATORY_CARE_PROVIDER_SITE_OTHER): Payer: BLUE CROSS/BLUE SHIELD | Admitting: Family Medicine

## 2018-08-12 VITALS — BP 104/62 | HR 64 | Temp 99.0°F | Resp 16 | Wt 137.0 lb

## 2018-08-12 DIAGNOSIS — Z0000 Encounter for general adult medical examination without abnormal findings: Secondary | ICD-10-CM | POA: Diagnosis not present

## 2018-08-12 DIAGNOSIS — F419 Anxiety disorder, unspecified: Secondary | ICD-10-CM | POA: Diagnosis not present

## 2018-08-12 DIAGNOSIS — J309 Allergic rhinitis, unspecified: Secondary | ICD-10-CM

## 2018-08-12 DIAGNOSIS — E039 Hypothyroidism, unspecified: Secondary | ICD-10-CM | POA: Diagnosis not present

## 2018-08-12 NOTE — Progress Notes (Signed)
Patient: Brandi [name], Female    DOB: 14-May-1981, 37 y.o.   MRN: [id number] Visit Date: 08/12/2018  Today's Provider: Wilhemena Durie, MD   Chief Complaint  Patient presents with  . Annual Exam   Subjective:    Annual physical exam Brandi [name] is a 37 y.o. female who presents today for health maintenance and complete physical. She feels well. She reports exercising regularly. She reports she is sleeping fairly well.  Pap- 02/22/2015. Normal. HPV negative. Patient sees GYN. Colonoscopy- 04/30/2017. Normal. (has a 1st degree relative with colon polyps). Repeat in 5 years.    Review of Systems  Constitutional: Negative.   HENT: Negative.   Eyes: Negative.   Respiratory: Negative.   Cardiovascular: Negative.  Negative for leg swelling.  Gastrointestinal: Negative.   Endocrine: Negative.   Genitourinary: Negative.   Musculoskeletal: Positive for arthralgias, joint swelling and myalgias.  Skin: Negative.   Allergic/Immunologic: Negative.   Neurological: Negative.   Hematological: Negative.   Psychiatric/Behavioral: Negative.     Social History      She  reports that she has never smoked. She has never used smokeless tobacco. She reports that she drinks about 4.0 standard drinks of alcohol per week. She reports that she does not use drugs.       Social History   Socioeconomic History  . Marital status: Married    Spouse name: Not on file  . Number of children: Not on file  . Years of education: Not on file  . Highest education level: Not on file  Occupational History  . Not on file  Social Needs  . Financial resource strain: Not on file  . Food insecurity:    Worry: Not on file    Inability: Not on file  . Transportation needs:    Medical: Not on file    Non-medical: Not on file  Tobacco Use  . Smoking status: Never Smoker  . Smokeless tobacco: Never Used  Substance and Sexual Activity  . Alcohol use: Yes    Alcohol/week: 4.0 standard drinks   Types: 4 Glasses of wine per week    Comment: rarely  . Drug use: No  . Sexual activity: Yes  Lifestyle  . Physical activity:    Days per week: Not on file    Minutes per session: Not on file  . Stress: Not on file  Relationships  . Social connections:    Talks on phone: Not on file    Gets together: Not on file    Attends religious service: Not on file    Active member of club or organization: Not on file    Attends meetings of clubs or organizations: Not on file    Relationship status: Not on file  Other Topics Concern  . Not on file  Social History Narrative  . Not on file    Past Medical History:  Diagnosis Date  . Bronchitis    for last month.  started antibiotic 04/19/17  . Hypothyroidism   . Migraine headache    "seems r/t menstrual cycle"  . Motion sickness    all vehicles     Patient Active Problem List   Diagnosis Date Noted  . Family history of colonic polyps   . Allergic rhinitis 11/12/2015  . Anxiety 11/12/2015  . 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 Normal pregnancy, incidental 11/12/2015  . Active labor at term 01/10/2015  . S/P cesarean section 01/10/2015    Past Surgical History:  Procedure Laterality Date  . CESAREAN SECTION  04/2010  . CESAREAN SECTION N/A 01/10/2015   Procedure: CESAREAN SECTION;  Surgeon: Melina Schools, MD;  Location: [facility];  Service: Obstetrics;  Laterality: N/A;  . COLONOSCOPY    . COLONOSCOPY WITH PROPOFOL N/A 04/30/2017   Procedure: COLONOSCOPY WITH PROPOFOL;  Surgeon: Lucilla Lame, MD;  Location: [place];  Service: Endoscopy;  Laterality: N/A;  . laprascopic     exploratory  . OTHER SURGICAL HISTORY  2015   IVF egg retrieval    Family History        Family Status  Relation Name Status  . Mother  Alive  . Father  Alive  . Brother  Alive  . Annamarie Major  Deceased  . MGM  Alive  . MGF  Deceased  . PGM  Deceased  . PGF  Deceased  . Brother  Alive  . Neg Hx  (Not Specified)        Her family history  includes Aneurysm in her paternal grandfather; Anxiety disorder (age of onset: 76) in her mother; Breast cancer (age of onset: 68) in her maternal grandmother and mother; Cancer in her father, maternal grandmother, mother, and paternal uncle; Dementia in her maternal grandmother; Hyperlipidemia in her father and mother; Migraines in her mother; Stroke in her maternal grandfather. There is no history of Alcohol abuse, Arthritis, Asthma, Birth defects, COPD, Depression, Diabetes, Drug abuse, Early death, Hearing loss, Heart disease, Hypertension, Kidney disease, Learning disabilities, Mental illness, Mental retardation, Miscarriages / Stillbirths, Vision loss, or Varicose Veins.      Allergies  Allergen Reactions  . Adhesive [Tape] Rash    Some bandaids  . Neosporin [Neomycin-Bacitracin Zn-Polymyx] Rash  . Sulfa Antibiotics Rash     Current Outpatient Medications:  .  [facility] ALLERGY RELIEF 10 MG tablet, TAKE 1 TABLET (10 MG TOTAL) BY MOUTH DAILY., Disp: 30 tablet, Rfl: 10 .  ibuprofen (ADVIL,MOTRIN) 600 MG tablet, Take 1 tablet (600 mg total) by mouth every 6 (six) hours., Disp: 30 tablet, Rfl: 0 .  levothyroxine (SYNTHROID, LEVOTHROID) 50 MCG tablet, Take 1 tablet (50 mcg total) by mouth daily before breakfast., Disp: 30 tablet, Rfl: 0 .  loratadine (CLARITIN) 10 MG tablet, Take 10 mg by mouth daily as needed for allergies., Disp: , Rfl:  .  sertraline (ZOLOFT) 50 MG tablet, Take 1.5 tablets (75 mg total) by mouth daily., Disp: 135 tablet, Rfl: 0 .  SUMAtriptan (IMITREX) 50 MG tablet, Take 50 mg by mouth every 2 (two) hours as needed for migraine. May repeat in 2 hours if headache persists or recurs., Disp: , Rfl:  .  amoxicillin-clavulanate (AUGMENTIN) 875-125 MG tablet, Take 1 tablet by mouth 2 (two) times daily., Disp: , Rfl:  .  HYDROcodone-homatropine (HYCODAN) 5-1.5 MG/5ML syrup, Take 5 mLs by mouth every 8 (eight) hours as needed for cough. (Patient not taking: Reported on 08/12/2018), Disp:  120 mL, Rfl: 0 .  hydrocortisone (ANUSOL-HC) 25 MG suppository, Place 1 suppository (25 mg total) rectally 2 (two) times daily. (Patient not taking: Reported on 04/23/2017), Disp: 12 suppository, Rfl: 0   Patient Care Team: Jerrol Banana., MD as PCP - General (Family Medicine)      Objective:   Vitals: BP 104/62 (BP Location: Left Arm, Patient Position: Sitting, Cuff Size: Normal)   Pulse 64   Temp 99 F (37.2 C)   Resp 16   Wt 137 lb (62.1 kg)   LMP 07/30/2018   SpO2 96%  BMI 22.11 kg/m    Vitals:   08/12/18 0933  BP: 104/62  Pulse: 64  Resp: 16  Temp: 99 F (37.2 C)  SpO2: 96%  Weight: 137 lb (62.1 kg)     Physical Exam  Constitutional: She is oriented to person, place, and time. She appears well-developed and well-nourished.  HENT:  Head: Normocephalic and atraumatic.  Right Ear: External ear normal.  Left Ear: External ear normal.  Nose: Nose normal.  Mouth/Throat: Oropharynx is clear and moist.  Eyes: Pupils are equal, round, and reactive to light. Conjunctivae are normal. No scleral icterus.  Neck: No thyromegaly present.  Cardiovascular: Normal rate, regular rhythm, normal heart sounds and intact distal pulses.  Pulmonary/Chest: Effort normal and breath sounds normal.  Abdominal: Soft.  Musculoskeletal: She exhibits no edema.  Lymphadenopathy:    She has no cervical adenopathy.  Neurological: She is alert and oriented to person, place, and time.  Skin: Skin is warm and dry.  Psychiatric: She has a normal mood and affect. Her behavior is normal. Judgment and thought content normal.     Depression Screen PHQ 2/9 Scores 08/12/2018 08/12/2016  PHQ - 2 Score 0 0      Assessment & Plan:     Routine Health Maintenance and Physical Exam  Exercise Activities and Dietary recommendations Goals   None     Immunization History  Administered Date(s) Administered  . Influenza,inj,Quad PF,6+ Mos 09/27/2015, 09/11/2016    Health Maintenance    Topic Date Due  . TETANUS/TDAP  08/09/1999  . PAP SMEAR  02/21/2018  . INFLUENZA VACCINE  06/24/2018  . COLONOSCOPY  04/30/2022  . HIV Screening  Completed     Discussed health benefits of physical activity, and encouraged her to engage in regular exercise appropriate for her age and condition.  1. Annual physical exam  - CBC with Differential/Platelet - Comprehensive metabolic panel  2. Hypothyroidism, unspecified type  - TSH  3. Allergic rhinitis, unspecified seasonality, unspecified trigger   4. Anxiety Stable.     I have done the exam and reviewed the above chart and it is accurate to the best of my knowledge. Development worker, community has been used in this note in any air is in the dictation or transcription are unintentional.   Wilhemena Durie, MD  [place]

## 2018-08-13 LAB — COMPREHENSIVE METABOLIC PANEL
A/G RATIO: 1.8 (ref 1.2–2.2)
ALK PHOS: 49 IU/L (ref 39–117)
ALT: 9 IU/L (ref 0–32)
AST: 14 IU/L (ref 0–40)
Albumin: 4.6 g/dL (ref 3.5–5.5)
BILIRUBIN TOTAL: 0.4 mg/dL (ref 0.0–1.2)
BUN/Creatinine Ratio: 17 (ref 9–23)
BUN: 11 mg/dL (ref 6–20)
CO2: 25 mmol/L (ref 20–29)
Calcium: 9.4 mg/dL (ref 8.7–10.2)
Chloride: 99 mmol/L (ref 96–106)
Creatinine, Ser: 0.65 mg/dL (ref 0.57–1.00)
GFR calc Af Amer: 130 mL/min/{1.73_m2} (ref >59)
GFR calc non Af Amer: 113 mL/min/{1.73_m2} (ref >59)
GLOBULIN, TOTAL: 2.6 g/dL (ref 1.5–4.5)
Glucose: 83 mg/dL (ref 65–99)
POTASSIUM: 4.1 mmol/L (ref 3.5–5.2)
SODIUM: 140 mmol/L (ref 134–144)
Total Protein: 7.2 g/dL (ref 6.0–8.5)

## 2018-08-13 LAB — CBC WITH DIFFERENTIAL/PLATELET
BASOS: 2 %
Basophils Absolute: 0.1 10*3/uL (ref 0.0–0.2)
EOS (ABSOLUTE): 0.1 10*3/uL (ref 0.0–0.4)
EOS: 4 %
HEMATOCRIT: 41.1 % (ref 34.0–46.6)
Hemoglobin: 13.7 g/dL (ref 11.1–15.9)
IMMATURE GRANS (ABS): 0 10*3/uL (ref 0.0–0.1)
IMMATURE GRANULOCYTES: 0 %
LYMPHS: 41 %
Lymphocytes Absolute: 1.3 10*3/uL (ref 0.7–3.1)
MCH: 30.2 pg (ref 26.6–33.0)
MCHC: 33.3 g/dL (ref 31.5–35.7)
MCV: 91 fL (ref 79–97)
Monocytes Absolute: 0.3 10*3/uL (ref 0.1–0.9)
Monocytes: 9 %
NEUTROS PCT: 44 %
Neutrophils Absolute: 1.3 10*3/uL — ABNORMAL LOW (ref 1.4–7.0)
Platelets: 310 10*3/uL (ref 150–450)
RBC: 4.53 x10E6/uL (ref 3.77–5.28)
RDW: 11.8 % — AB (ref 12.3–15.4)
WBC: 3 10*3/uL — AB (ref 3.4–10.8)

## 2018-08-13 LAB — TSH: TSH: 2.11 u[IU]/mL (ref 0.450–4.500)

## 2018-08-17 ENCOUNTER — Telehealth: Payer: Self-pay

## 2018-08-17 NOTE — Telephone Encounter (Signed)
Left message to call back  

## 2018-08-17 NOTE — Telephone Encounter (Signed)
-----   Message from Jerrol Banana., MD sent at 08/17/2018 10:17 AM EDT ----- Labs ok.

## 2018-08-20 ENCOUNTER — Other Ambulatory Visit: Payer: Self-pay | Admitting: Family Medicine

## 2018-08-20 DIAGNOSIS — E039 Hypothyroidism, unspecified: Secondary | ICD-10-CM | POA: Insufficient documentation

## 2018-08-23 NOTE — Telephone Encounter (Signed)
Pt advised.   Thanks,   -Joslynn Jamroz  

## 2018-09-15 ENCOUNTER — Ambulatory Visit (INDEPENDENT_AMBULATORY_CARE_PROVIDER_SITE_OTHER): Payer: BLUE CROSS/BLUE SHIELD

## 2018-09-15 DIAGNOSIS — Z23 Encounter for immunization: Secondary | ICD-10-CM

## 2018-09-20 DIAGNOSIS — D2262 Melanocytic nevi of left upper limb, including shoulder: Secondary | ICD-10-CM | POA: Diagnosis not present

## 2018-09-20 DIAGNOSIS — D2272 Melanocytic nevi of left lower limb, including hip: Secondary | ICD-10-CM | POA: Diagnosis not present

## 2018-09-20 DIAGNOSIS — D2261 Melanocytic nevi of right upper limb, including shoulder: Secondary | ICD-10-CM | POA: Diagnosis not present

## 2018-09-20 DIAGNOSIS — D225 Melanocytic nevi of trunk: Secondary | ICD-10-CM | POA: Diagnosis not present

## 2018-10-21 ENCOUNTER — Other Ambulatory Visit: Payer: Self-pay | Admitting: Family Medicine

## 2018-10-21 DIAGNOSIS — F419 Anxiety disorder, unspecified: Secondary | ICD-10-CM

## 2019-02-11 ENCOUNTER — Other Ambulatory Visit: Payer: Self-pay | Admitting: Family Medicine

## 2019-02-11 DIAGNOSIS — F419 Anxiety disorder, unspecified: Secondary | ICD-10-CM

## 2019-02-14 ENCOUNTER — Ambulatory Visit (INDEPENDENT_AMBULATORY_CARE_PROVIDER_SITE_OTHER): Payer: BLUE CROSS/BLUE SHIELD | Admitting: Family Medicine

## 2019-02-14 ENCOUNTER — Other Ambulatory Visit: Payer: Self-pay

## 2019-02-14 VITALS — BP 104/62 | HR 68 | Temp 98.4°F | Resp 16 | Wt 140.0 lb

## 2019-02-14 DIAGNOSIS — R059 Cough, unspecified: Secondary | ICD-10-CM

## 2019-02-14 DIAGNOSIS — J324 Chronic pansinusitis: Secondary | ICD-10-CM

## 2019-02-14 DIAGNOSIS — R05 Cough: Secondary | ICD-10-CM

## 2019-02-14 MED ORDER — AZITHROMYCIN 250 MG PO TABS: ORAL_TABLET | ORAL | 0 refills | Status: DC

## 2019-02-14 NOTE — Patient Instructions (Signed)
Robitussin for cough

## 2019-02-14 NOTE — Progress Notes (Signed)
Stefanny Pieri  MRN: [id number] DOB: [birth year]  Subjective:  HPI   Patient is a 38 year old female who presents with symptoms of cough with some production of yellowish sputum.  She states she traveled to [place] 3 weeks and felt fine.  About 1.5 weeks ago she began having some chest tightness/pain and then cough.  She states her son has been coughing and had some fever.  She reports that she is tired and weak. She met some girl friends from [place] in [place]. No one has been sick from the group. Her 2nd child and her daughter are well.  Patient Active Problem List   Diagnosis Date Noted  . Hypothyroidism 08/20/2018  . Family history of colonic polyps   . Allergic rhinitis 11/12/2015  . Anxiety 11/12/2015  . 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 Normal pregnancy, incidental 11/12/2015  . Active labor at term 01/10/2015  . S/P cesarean section 01/10/2015    Past Medical History:  Diagnosis Date  . Bronchitis    for last month.  started antibiotic 04/19/17  . Hypothyroidism   . Migraine headache    "seems r/t menstrual cycle"  . Motion sickness    all vehicles    Social History   Socioeconomic History  . Marital status: Married    Spouse name: Not on file  . Number of children: Not on file  . Years of education: Not on file  . Highest education level: Not on file  Occupational History  . Not on file  Social Needs  . Financial resource strain: Not on file  . Food insecurity:    Worry: Not on file    Inability: Not on file  . Transportation needs:    Medical: Not on file    Non-medical: Not on file  Tobacco Use  . Smoking status: Never Smoker  . Smokeless tobacco: Never Used  Substance and Sexual Activity  . Alcohol use: Yes    Alcohol/week: 4.0 standard drinks    Types: 4 Glasses of wine per week    Comment: rarely  . Drug use: No  . Sexual activity: Yes  Lifestyle  . Physical activity:    Days per week: Not on file    Minutes per session: Not on  file  . Stress: Not on file  Relationships  . Social connections:    Talks on phone: Not on file    Gets together: Not on file    Attends religious service: Not on file    Active member of club or organization: Not on file    Attends meetings of clubs or organizations: Not on file    Relationship status: Not on file  . Intimate partner violence:    Fear of current or ex partner: Not on file    Emotionally abused: Not on file    Physically abused: Not on file    Forced sexual activity: Not on file  Other Topics Concern  . Not on file  Social History Narrative  . Not on file    Outpatient Encounter Medications as of 02/14/2019  Medication Sig  . levothyroxine (SYNTHROID, LEVOTHROID) 50 MCG tablet TAKE 1 TABLET BY MOUTH DAILY BEFORE BREAKFAST  . loratadine (CLARITIN) 10 MG tablet Take 10 mg by mouth daily as needed for allergies.  Marland Kitchen sertraline (ZOLOFT) 50 MG tablet TAKE 1 AND 1/2 TABLETS BY MOUTH DAILY  . SUMAtriptan (IMITREX) 50 MG tablet Take 50 mg by mouth every 2 (two) hours as needed for migraine.  May repeat in 2 hours if headache persists or recurs.  . [DISCONTINUED] amoxicillin-clavulanate (AUGMENTIN) 875-125 MG tablet Take 1 tablet by mouth 2 (two) times daily.  . [DISCONTINUED] [employer] ALLERGY RELIEF 10 MG tablet TAKE 1 TABLET (10 MG TOTAL) BY MOUTH DAILY.  . [DISCONTINUED] HYDROcodone-homatropine (HYCODAN) 5-1.5 MG/5ML syrup Take 5 mLs by mouth every 8 (eight) hours as needed for cough. (Patient not taking: Reported on 08/12/2018)  . [DISCONTINUED] hydrocortisone (ANUSOL-HC) 25 MG suppository Place 1 suppository (25 mg total) rectally 2 (two) times daily. (Patient not taking: Reported on 04/23/2017)  . [DISCONTINUED] ibuprofen (ADVIL,MOTRIN) 600 MG tablet Take 1 tablet (600 mg total) by mouth every 6 (six) hours.   No facility-administered encounter medications on file as of 02/14/2019.     Allergies  Allergen Reactions  . Adhesive [Tape] Rash    Some bandaids  . Neosporin  [Neomycin-Bacitracin Zn-Polymyx] Rash  . Sulfa Antibiotics Rash    Review of Systems  Constitutional: Positive for chills.  HENT: Positive for sore throat (? from cough and hoarseness). Negative for congestion, ear discharge, ear pain, hearing loss, sinus pain and tinnitus.   Eyes: Negative.   Respiratory: Positive for cough and sputum production. Negative for shortness of breath and wheezing.   Cardiovascular: Positive for chest pain. Negative for palpitations and claudication.       She has had some chest pain she associates with the cough.  Gastrointestinal: Negative.   Endo/Heme/Allergies: Negative.   Psychiatric/Behavioral: Negative.     Objective:  BP 104/62 (BP Location: Right Arm, Patient Position: Sitting, Cuff Size: Normal)   Pulse 68   Temp 98.4 F (36.9 C) (Oral)   Resp 16   Wt 140 lb (63.5 kg)   SpO2 96%   BMI 22.60 kg/m   Physical Exam  Constitutional: She is oriented to person, place, and time and well-developed, well-nourished, and in no distress.  HENT:  Head: Normocephalic and atraumatic.  Eyes: No scleral icterus.  Cardiovascular: Normal rate and regular rhythm.  Pulmonary/Chest: Effort normal and breath sounds normal.  Abdominal: Soft.  Lymphadenopathy:    She has no cervical adenopathy.  Neurological: She is alert and oriented to person, place, and time. Gait normal. GCS score is 15.  Skin: Skin is warm.  Psychiatric: Mood, memory, affect and judgment normal.    Assessment and Plan :  1. Pansinusitis, unspecified chronicity  - azithromycin (ZITHROMAX) 250 MG tablet; Take 2 by mouth on day 1 and then 1 daily  Dispense: 6 tablet; Refill: 0  2. Cough Afebrile. If I had Covid testing available I would test her but we do not. She is looking fine today. Stressed Staying at home and social distancing ,especially staing home until she feels well. She is to let us know if she worsens. We discussed CXR and decided against it.  I have done the exam and  reviewed the chart and it is accurate to the best of my knowledge. Development worker, community has been used and  any errors in dictation or transcription are unintentional. Miguel Aschoff M.D. [facility]

## 2019-05-02 ENCOUNTER — Other Ambulatory Visit: Payer: Self-pay | Admitting: Family Medicine

## 2019-05-02 NOTE — Telephone Encounter (Signed)
Please review

## 2019-06-14 DIAGNOSIS — D2261 Melanocytic nevi of right upper limb, including shoulder: Secondary | ICD-10-CM | POA: Diagnosis not present

## 2019-06-14 DIAGNOSIS — D2271 Melanocytic nevi of right lower limb, including hip: Secondary | ICD-10-CM | POA: Diagnosis not present

## 2019-06-14 DIAGNOSIS — D2262 Melanocytic nevi of left upper limb, including shoulder: Secondary | ICD-10-CM | POA: Diagnosis not present

## 2019-06-14 DIAGNOSIS — D225 Melanocytic nevi of trunk: Secondary | ICD-10-CM | POA: Diagnosis not present

## 2019-07-05 DIAGNOSIS — Z130 Encounter for screening for diseases of the blood and blood-forming organs and certain disorders involving the immune mechanism: Secondary | ICD-10-CM | POA: Diagnosis not present

## 2019-07-05 DIAGNOSIS — Z0000 Encounter for general adult medical examination without abnormal findings: Secondary | ICD-10-CM | POA: Diagnosis not present

## 2019-07-05 DIAGNOSIS — N930 Postcoital and contact bleeding: Secondary | ICD-10-CM | POA: Diagnosis not present

## 2019-07-05 DIAGNOSIS — Z113 Encounter for screening for infections with a predominantly sexual mode of transmission: Secondary | ICD-10-CM | POA: Diagnosis not present

## 2019-07-05 DIAGNOSIS — Z01419 Encounter for gynecological examination (general) (routine) without abnormal findings: Secondary | ICD-10-CM | POA: Diagnosis not present

## 2019-07-05 DIAGNOSIS — R5383 Other fatigue: Secondary | ICD-10-CM | POA: Diagnosis not present

## 2019-07-05 DIAGNOSIS — Z1389 Encounter for screening for other disorder: Secondary | ICD-10-CM | POA: Diagnosis not present

## 2019-07-05 DIAGNOSIS — Z6822 Body mass index (BMI) 22.0-22.9, adult: Secondary | ICD-10-CM | POA: Diagnosis not present

## 2019-07-06 ENCOUNTER — Other Ambulatory Visit: Payer: Self-pay

## 2019-07-06 ENCOUNTER — Telehealth: Payer: Self-pay

## 2019-07-06 DIAGNOSIS — Z20822 Contact with and (suspected) exposure to covid-19: Secondary | ICD-10-CM

## 2019-07-06 DIAGNOSIS — Z20828 Contact with and (suspected) exposure to other viral communicable diseases: Secondary | ICD-10-CM

## 2019-07-06 NOTE — Telephone Encounter (Signed)
Order for Covid Screening.   Thanks,   -Mickel Baas

## 2019-07-07 LAB — NOVEL CORONAVIRUS, NAA: SARS-CoV-2, NAA: NOT DETECTED

## 2019-08-18 ENCOUNTER — Ambulatory Visit (INDEPENDENT_AMBULATORY_CARE_PROVIDER_SITE_OTHER): Payer: BC Managed Care – PPO | Admitting: Family Medicine

## 2019-08-18 ENCOUNTER — Other Ambulatory Visit: Payer: Self-pay

## 2019-08-18 ENCOUNTER — Encounter: Payer: Self-pay | Admitting: Family Medicine

## 2019-08-18 VITALS — BP 104/62 | HR 68 | Resp 16 | Ht 66.0 in | Wt 143.0 lb

## 2019-08-18 DIAGNOSIS — E039 Hypothyroidism, unspecified: Secondary | ICD-10-CM

## 2019-08-18 DIAGNOSIS — M549 Dorsalgia, unspecified: Secondary | ICD-10-CM | POA: Diagnosis not present

## 2019-08-18 DIAGNOSIS — Z0000 Encounter for general adult medical examination without abnormal findings: Secondary | ICD-10-CM

## 2019-08-18 DIAGNOSIS — F419 Anxiety disorder, unspecified: Secondary | ICD-10-CM

## 2019-08-18 NOTE — Progress Notes (Signed)
Patient: Brandi [name], Female    DOB: Aug 05, 1981, 38 y.o.   MRN: [id number] Visit Date: 08/18/2019  Today's Provider: Wilhemena Durie, MD   Chief Complaint  Patient presents with  . Annual Exam   Subjective:     Annual physical exam Brandi [name] is a 38 y.o. female who presents today for health maintenance and complete physical. She feels well. She reports exercising occasionally. She reports she is sleeping fairly well. She is married and has 2 children.She and her husband are discussing him having a vasectomy.  Pap- 02/22/2015. Normal. HPV negative. Patient sees GYN. Colonoscopy- 04/30/2017. Normal. (has a 1st degree relative with colon polyps). Repeat in 5 years.   Review of Systems  Constitutional: Positive for unexpected weight change.       Weight gain of 6 lbs in the past years.  HENT: Negative.   Eyes: Negative.   Respiratory: Negative.   Cardiovascular: Negative.  Negative for leg swelling.  Gastrointestinal: Negative.   Endocrine: Negative.   Genitourinary: Negative.   Musculoskeletal: Positive for back pain.       Chronic upper back pain worse as day goes on. Bra straps leave a dent on her shoulders.  Skin: Negative.   Allergic/Immunologic: Negative.   Neurological: Negative.   Hematological: Negative.   Psychiatric/Behavioral: Negative.     Social History      She  reports that she has never smoked. She has never used smokeless tobacco. She reports current alcohol use of about 4.0 standard drinks of alcohol per week. She reports that she does not use drugs.       Social History   Socioeconomic History  . Marital status: Married    Spouse name: Not on file  . Number of children: Not on file  . Years of education: Not on file  . Highest education level: Not on file  Occupational History  . Not on file  Social Needs  . Financial resource strain: Not on file  . Food insecurity    Worry: Not on file    Inability: Not on file  .  Transportation needs    Medical: Not on file    Non-medical: Not on file  Tobacco Use  . Smoking status: Never Smoker  . Smokeless tobacco: Never Used  Substance and Sexual Activity  . Alcohol use: Yes    Alcohol/week: 4.0 standard drinks    Types: 4 Glasses of wine per week    Comment: rarely  . Drug use: No  . Sexual activity: Yes  Lifestyle  . Physical activity    Days per week: Not on file    Minutes per session: Not on file  . Stress: Not on file  Relationships  . Social Herbalist on phone: Not on file    Gets together: Not on file    Attends religious service: Not on file    Active member of club or organization: Not on file    Attends meetings of clubs or organizations: Not on file    Relationship status: Not on file  Other Topics Concern  . Not on file  Social History Narrative  . Not on file    Past Medical History:  Diagnosis Date  . Bronchitis    for last month.  started antibiotic 04/19/17  . Hypothyroidism   . Migraine headache    "seems r/t menstrual cycle"  . Motion sickness    all vehicles  Patient Active Problem List   Diagnosis Date Noted  . Hypothyroidism 08/20/2018  . Family history of colonic polyps   . Allergic rhinitis 11/12/2015  . Anxiety 11/12/2015  . 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 Normal pregnancy, incidental 11/12/2015  . Active labor at term 01/10/2015  . S/P cesarean section 01/10/2015    Past Surgical History:  Procedure Laterality Date  . CESAREAN SECTION  04/2010  . CESAREAN SECTION N/A 01/10/2015   Procedure: CESAREAN SECTION;  Surgeon: Melina Schools, MD;  Location: [facility];  Service: Obstetrics;  Laterality: N/A;  . COLONOSCOPY    . COLONOSCOPY WITH PROPOFOL N/A 04/30/2017   Procedure: COLONOSCOPY WITH PROPOFOL;  Surgeon: Lucilla Lame, MD;  Location: [facility];  Service: Endoscopy;  Laterality: N/A;  . laprascopic     exploratory  . OTHER SURGICAL HISTORY  2015   IVF egg retrieval    Family  History        Family Status  Relation Name Status  . Mother  Alive  . Father  Alive  . Brother  Alive  . Annamarie Major  Deceased  . MGM  Alive  . MGF  Deceased  . PGM  Deceased  . PGF  Deceased  . Brother  Alive  . Neg Hx  (Not Specified)        Her family history includes Aneurysm in her paternal grandfather; Anxiety disorder (age of onset: 13) in her mother; Breast cancer (age of onset: 27) in her maternal grandmother and mother; Cancer in her father, maternal grandmother, mother, and paternal uncle; Dementia in her maternal grandmother; Hyperlipidemia in her father and mother; Migraines in her mother; Stroke in her maternal grandfather. There is no history of Alcohol abuse, Arthritis, Asthma, Birth defects, COPD, Depression, Diabetes, Drug abuse, Early death, Hearing loss, Heart disease, Hypertension, Kidney disease, Learning disabilities, Mental illness, Mental retardation, Miscarriages / Stillbirths, Vision loss, or Varicose Veins.      Allergies  Allergen Reactions  . Adhesive [Tape] Rash    Some bandaids  . Neosporin [Neomycin-Bacitracin Zn-Polymyx] Rash  . Sulfa Antibiotics Rash     Current Outpatient Medications:  .  levothyroxine (SYNTHROID, LEVOTHROID) 50 MCG tablet, TAKE 1 TABLET BY MOUTH DAILY BEFORE BREAKFAST, Disp: 30 tablet, Rfl: 12 .  loratadine (CLARITIN) 10 MG tablet, TAKE 1 TABLET (10 MG TOTAL) BY MOUTH DAILY., Disp: 90 tablet, Rfl: 3 .  sertraline (ZOLOFT) 50 MG tablet, TAKE 1 AND 1/2 TABLETS BY MOUTH DAILY, Disp: 135 tablet, Rfl: 2 .  SUMAtriptan (IMITREX) 50 MG tablet, Take 50 mg by mouth every 2 (two) hours as needed for migraine. May repeat in 2 hours if headache persists or recurs., Disp: , Rfl:  .  azithromycin (ZITHROMAX) 250 MG tablet, Take 2 by mouth on day 1 and then 1 daily, Disp: 6 tablet, Rfl: 0   Patient Care Team: Jerrol Banana., MD as PCP - General (Family Medicine)    Objective:    Vitals: BP 104/62   Pulse 68   Resp 16   Ht 5\' 6"   (1.676 m)   Wt 143 lb (64.9 kg)   SpO2 98%   BMI 23.08 kg/m    Vitals:   08/18/19 0913  BP: 104/62  Pulse: 68  Resp: 16  SpO2: 98%  Weight: 143 lb (64.9 kg)  Height: 5\' 6"  (1.676 m)     Physical Exam Vitals signs reviewed.  Constitutional:      Appearance: She is well-developed.  HENT:  Head: Normocephalic and atraumatic.     Right Ear: External ear normal.     Left Ear: External ear normal.     Nose: Nose normal.  Eyes:     General: No scleral icterus.    Conjunctiva/sclera: Conjunctivae normal.     Pupils: Pupils are equal, round, and reactive to light.  Neck:     Thyroid: No thyromegaly.  Cardiovascular:     Rate and Rhythm: Normal rate and regular rhythm.     Heart sounds: Normal heart sounds.  Pulmonary:     Effort: Pulmonary effort is normal.     Breath sounds: Normal breath sounds.  Abdominal:     Palpations: Abdomen is soft.  Lymphadenopathy:     Cervical: No cervical adenopathy.  Skin:    General: Skin is warm and dry.     Comments:  Multiple Small nevi of skin   Neurological:     General: No focal deficit present.     Mental Status: She is alert and oriented to person, place, and time.  Psychiatric:        Mood and Affect: Mood normal.        Behavior: Behavior normal.        Thought Content: Thought content normal.        Judgment: Judgment normal.      Depression Screen PHQ 2/9 Scores 08/12/2018 08/12/2016  PHQ - 2 Score 0 0       Assessment & Plan:     Routine Health Maintenance and Physical Exam  Exercise Activities and Dietary recommendations Goals   None     Immunization History  Administered Date(s) Administered  . Influenza,inj,Quad PF,6+ Mos 09/27/2015, 09/11/2016, 09/15/2018    Health Maintenance  Topic Date Due  . PAP SMEAR-Modifier  02/21/2018  . INFLUENZA VACCINE  06/25/2019  . COLONOSCOPY  04/30/2022  . TETANUS/TDAP  10/30/2024  . HIV Screening  Completed     Discussed health benefits of physical  activity, and encouraged her to engage in regular exercise appropriate for her age and condition.   1. Annual physical exam Well woman exam per gyn.Colonoscopy 04/30/17 for FH colon cancer - CBC with Differential/Platelet - Comprehensive metabolic panel - Lipid panel - TSH  2. Acquired hypothyroidism   3. Anxiety Well controlled. On sertraline.  4. Upper back pain Due to large breasts.     Richard Cranford Mon, MD  [facility]

## 2019-08-19 LAB — COMPREHENSIVE METABOLIC PANEL
ALT: 11 IU/L (ref 0–32)
AST: 19 IU/L (ref 0–40)
Albumin/Globulin Ratio: 1.9 (ref 1.2–2.2)
Albumin: 4.6 g/dL (ref 3.8–4.8)
Alkaline Phosphatase: 51 IU/L (ref 39–117)
BUN/Creatinine Ratio: 12 (ref 9–23)
BUN: 8 mg/dL (ref 6–20)
Bilirubin Total: 0.4 mg/dL (ref 0.0–1.2)
CO2: 25 mmol/L (ref 20–29)
Calcium: 9.4 mg/dL (ref 8.7–10.2)
Chloride: 102 mmol/L (ref 96–106)
Creatinine, Ser: 0.67 mg/dL (ref 0.57–1.00)
GFR calc Af Amer: 128 mL/min/{1.73_m2} (ref >59)
GFR calc non Af Amer: 111 mL/min/{1.73_m2} (ref >59)
Globulin, Total: 2.4 g/dL (ref 1.5–4.5)
Glucose: 87 mg/dL (ref 65–99)
Potassium: 4.5 mmol/L (ref 3.5–5.2)
Sodium: 141 mmol/L (ref 134–144)
Total Protein: 7 g/dL (ref 6.0–8.5)

## 2019-08-19 LAB — CBC WITH DIFFERENTIAL/PLATELET
Basophils Absolute: 0.1 10*3/uL (ref 0.0–0.2)
Basos: 2 %
EOS (ABSOLUTE): 0 10*3/uL (ref 0.0–0.4)
Eos: 1 %
Hematocrit: 41 % (ref 34.0–46.6)
Hemoglobin: 13.3 g/dL (ref 11.1–15.9)
Immature Grans (Abs): 0 10*3/uL (ref 0.0–0.1)
Immature Granulocytes: 0 %
Lymphocytes Absolute: 1.6 10*3/uL (ref 0.7–3.1)
Lymphs: 46 %
MCH: 29.7 pg (ref 26.6–33.0)
MCHC: 32.4 g/dL (ref 31.5–35.7)
MCV: 92 fL (ref 79–97)
Monocytes Absolute: 0.4 10*3/uL (ref 0.1–0.9)
Monocytes: 10 %
Neutrophils Absolute: 1.4 10*3/uL (ref 1.4–7.0)
Neutrophils: 41 %
Platelets: 277 10*3/uL (ref 150–450)
RBC: 4.48 x10E6/uL (ref 3.77–5.28)
RDW: 12.2 % (ref 11.7–15.4)
WBC: 3.4 10*3/uL (ref 3.4–10.8)

## 2019-08-19 LAB — LIPID PANEL
Chol/HDL Ratio: 3.3 ratio (ref 0.0–4.4)
Cholesterol, Total: 211 mg/dL — ABNORMAL HIGH (ref 100–199)
HDL: 63 mg/dL (ref >39)
LDL Chol Calc (NIH): 131 mg/dL — ABNORMAL HIGH (ref 0–99)
Triglycerides: 94 mg/dL (ref 0–149)
VLDL Cholesterol Cal: 17 mg/dL (ref 5–40)

## 2019-08-19 LAB — TSH: TSH: 1.72 u[IU]/mL (ref 0.450–4.500)

## 2019-09-15 ENCOUNTER — Other Ambulatory Visit: Payer: Self-pay | Admitting: Family Medicine

## 2020-01-06 ENCOUNTER — Other Ambulatory Visit: Payer: Self-pay | Admitting: Family Medicine

## 2020-01-06 DIAGNOSIS — F419 Anxiety disorder, unspecified: Secondary | ICD-10-CM

## 2020-01-23 ENCOUNTER — Ambulatory Visit (INDEPENDENT_AMBULATORY_CARE_PROVIDER_SITE_OTHER): Payer: BC Managed Care – PPO | Admitting: Family Medicine

## 2020-01-23 DIAGNOSIS — F419 Anxiety disorder, unspecified: Secondary | ICD-10-CM

## 2020-01-23 DIAGNOSIS — K5900 Constipation, unspecified: Secondary | ICD-10-CM

## 2020-01-23 DIAGNOSIS — E039 Hypothyroidism, unspecified: Secondary | ICD-10-CM

## 2020-01-23 NOTE — Progress Notes (Signed)
Patient: Brandi [name] Female    DOB: [birth year]   39 y.o.   MRN: [id number] Visit Date: 01/23/2020  Today's Provider: Wilhemena Durie, MD   Chief Complaint  Patient presents with  . Fatigue  . Constipation   Subjective:    Virtual Visit via Telephone Note  I connected with Celene Kras on 01/23/20 at 10:00 AM EST by telephone and verified that I am speaking with the correct person using two identifiers.  Location: Patient: home Provider: office   I discussed the limitations, risks, security and privacy concerns of performing an evaluation and management service by telephone and the availability of in person appointments. I also discussed with the patient that there may be a patient responsible charge related to this service. The patient expressed understanding and agreed to proceed.  Constipation This is a new problem. The current episode started more than 1 month ago. Her stool frequency is 4 to 5 times per week. The stool is described as pencil thin. She exercises regularly. There has been adequate water intake. Associated symptoms include bloating and flatus. Pertinent negatives include no abdominal pain, back pain, diarrhea, difficulty urinating, fever, nausea, rectal pain or vomiting. Treatments tried: Miralax.  In the past the patient would have a bowel movement most every morning.  Now she feels as though she has incomplete emptying and smaller stools.  Overall she has been feeling well.  No abdominal pain, no rectal bleeding, no weight loss or systemic symptoms  Allergies  Allergen Reactions  . Adhesive [Tape] Rash    Some bandaids  . Neosporin [Neomycin-Bacitracin Zn-Polymyx] Rash  . Sulfa Antibiotics Rash     Current Outpatient Medications:  .  doxycycline (VIBRA-TABS) 100 MG tablet, doxycycline hyclate 100 mg tablet, Disp: , Rfl:  .  levothyroxine (SYNTHROID) 50 MCG tablet, TAKE 1 TABLET BY MOUTH DAILY BEFORE BREAKFAST, Disp: 90 tablet, Rfl: 4 .   loratadine (CLARITIN) 10 MG tablet, TAKE 1 TABLET (10 MG TOTAL) BY MOUTH DAILY., Disp: 90 tablet, Rfl: 3 .  sertraline (ZOLOFT) 50 MG tablet, TAKE 1 AND 1/2 TABLETS DAILY BY MOUTH, Disp: 45 tablet, Rfl: 0 .  azithromycin (ZITHROMAX) 250 MG tablet, Take 2 by mouth on day 1 and then 1 daily (Patient not taking: Reported on 01/23/2020), Disp: 6 tablet, Rfl: 0 .  SUMAtriptan (IMITREX) 50 MG tablet, Take 50 mg by mouth every 2 (two) hours as needed for migraine. May repeat in 2 hours if headache persists or recurs., Disp: , Rfl:   Review of Systems  Constitutional: Negative for fever.  Gastrointestinal: Positive for bloating, constipation and flatus. Negative for abdominal pain, diarrhea, nausea, rectal pain and vomiting.  Genitourinary: Negative for difficulty urinating.  Musculoskeletal: Negative for back pain.    Social History   Tobacco Use  . Smoking status: Never Smoker  . Smokeless tobacco: Never Used  Substance Use Topics  . Alcohol use: Yes    Alcohol/week: 4.0 standard drinks    Types: 4 Glasses of wine per week    Comment: rarely      Objective:   There were no vitals taken for this visit. There were no vitals filed for this visit.There is no height or weight on file to calculate BMI.   Physical Exam   No results found for any visits on 01/23/20.     Assessment & Plan     1. Constipation, unspecified constipation type Patient was started a probiotic daily.  She just recently started [employer]  daily.  We will add Metamucil daily and reassess in 2 weeks.  We may need to see her in the office for exam if she is not better then.  2. Acquired hypothyroidism TSH on next visit.  3. Anxiety Clinically stable per patient.  I discussed the assessment and treatment plan with the patient. The patient was provided an opportunity to ask questions and all were answered. The patient agreed with the plan and demonstrated an understanding of the instructions.   The patient was  advised to call back or seek an in-person evaluation if the symptoms worsen or if the condition fails to improve as anticipated.  I provided 12 minutes of non-face-to-face time during this encounter.     Richard Cranford Mon, MD  [facility]

## 2020-02-10 ENCOUNTER — Other Ambulatory Visit: Payer: Self-pay | Admitting: Family Medicine

## 2020-02-10 DIAGNOSIS — F419 Anxiety disorder, unspecified: Secondary | ICD-10-CM

## 2020-02-23 ENCOUNTER — Ambulatory Visit: Payer: Self-pay | Attending: Internal Medicine

## 2020-02-23 DIAGNOSIS — Z23 Encounter for immunization: Secondary | ICD-10-CM

## 2020-02-23 NOTE — Progress Notes (Signed)
   Covid-19 Vaccination Clinic  Name:  Brandi [name]    MRN: [id number] DOB: 07-06-1981  02/23/2020  [name] was observed post Covid-19 immunization for 15 minutes without incident. She was provided with Vaccine Information Sheet and instruction to access the V-Safe system.   [name] was instructed to call 911 with any severe reactions post vaccine: Marland Kitchen Difficulty breathing  . Swelling of face and throat  . A fast heartbeat  . A bad rash all over body  . Dizziness and weakness   Immunizations Administered    Name Date Dose VIS Date Route   Pfizer COVID-19 Vaccine 02/23/2020  2:06 PM 0.3 mL 11/04/2019 Intramuscular   Manufacturer: [place]   Lot: [id number]   [place]: [id number]

## 2020-02-28 DIAGNOSIS — L820 Inflamed seborrheic keratosis: Secondary | ICD-10-CM | POA: Diagnosis not present

## 2020-02-28 DIAGNOSIS — R208 Other disturbances of skin sensation: Secondary | ICD-10-CM | POA: Diagnosis not present

## 2020-02-28 DIAGNOSIS — L700 Acne vulgaris: Secondary | ICD-10-CM | POA: Diagnosis not present

## 2020-02-28 DIAGNOSIS — L538 Other specified erythematous conditions: Secondary | ICD-10-CM | POA: Diagnosis not present

## 2020-02-28 DIAGNOSIS — L231 Allergic contact dermatitis due to adhesives: Secondary | ICD-10-CM | POA: Diagnosis not present

## 2020-03-04 ENCOUNTER — Other Ambulatory Visit: Payer: Self-pay | Admitting: Family Medicine

## 2020-03-04 DIAGNOSIS — F419 Anxiety disorder, unspecified: Secondary | ICD-10-CM

## 2020-03-04 NOTE — Telephone Encounter (Signed)
Requested Prescriptions  Pending Prescriptions Disp Refills  . sertraline (ZOLOFT) 50 MG tablet [Pharmacy Med Name: SERTRALINE HCL 50 MG TABLET] 135 tablet 2    Sig: TAKE 1 AND 1/2 TABLETS BY MOUTH DAILY     Psychiatry:  Antidepressants - SSRI Failed - 03/04/2020 10:32 AM      Failed - Valid encounter within last 6 months    Recent Outpatient Visits          1 month ago Constipation, unspecified constipation type   [facility] Jerrol Banana., MD   6 months ago Annual physical exam   [facility] Jerrol Banana., MD   1 year ago Pansinusitis, unspecified chronicity   [facility] Jerrol Banana., MD   1 year ago Annual physical exam   [facility] Jerrol Banana., MD   2 years ago URI with cough and congestion   [place], [place], [place]

## 2020-03-21 ENCOUNTER — Ambulatory Visit: Payer: Self-pay | Attending: Internal Medicine

## 2020-03-21 DIAGNOSIS — Z23 Encounter for immunization: Secondary | ICD-10-CM

## 2020-03-21 NOTE — Progress Notes (Signed)
   Covid-19 Vaccination Clinic  Name:  Brandi [name]    MRN: [id number] DOB: Sep 12, 1981  03/21/2020  [name] was observed post Covid-19 immunization for 30 minutes based on pre-vaccination screening without incident. She was provided with Vaccine Information Sheet and instruction to access the V-Safe system.   [name] was instructed to call 911 with any severe reactions post vaccine: Marland Kitchen Difficulty breathing  . Swelling of face and throat  . A fast heartbeat  . A bad rash all over body  . Dizziness and weakness   Immunizations Administered    Name Date Dose VIS Date Route   Pfizer COVID-19 Vaccine 03/21/2020  4:52 PM 0.3 mL 01/18/2019 Intramuscular   Manufacturer: [place]   Lot: [id number]   [place]: [id number]

## 2020-05-06 ENCOUNTER — Other Ambulatory Visit: Payer: Self-pay | Admitting: Family Medicine

## 2020-07-10 DIAGNOSIS — Z01419 Encounter for gynecological examination (general) (routine) without abnormal findings: Secondary | ICD-10-CM | POA: Diagnosis not present

## 2020-07-10 DIAGNOSIS — Z130 Encounter for screening for diseases of the blood and blood-forming organs and certain disorders involving the immune mechanism: Secondary | ICD-10-CM | POA: Diagnosis not present

## 2020-07-10 DIAGNOSIS — Z6822 Body mass index (BMI) 22.0-22.9, adult: Secondary | ICD-10-CM | POA: Diagnosis not present

## 2020-07-10 DIAGNOSIS — Z1389 Encounter for screening for other disorder: Secondary | ICD-10-CM | POA: Diagnosis not present

## 2020-08-08 ENCOUNTER — Inpatient Hospital Stay: Payer: BC Managed Care – PPO | Attending: Oncology | Admitting: Licensed Clinical Social Worker

## 2020-08-08 ENCOUNTER — Encounter: Payer: Self-pay | Admitting: Licensed Clinical Social Worker

## 2020-08-08 ENCOUNTER — Other Ambulatory Visit: Payer: Self-pay

## 2020-08-08 DIAGNOSIS — Z8049 Family history of malignant neoplasm of other genital organs: Secondary | ICD-10-CM | POA: Insufficient documentation

## 2020-08-08 DIAGNOSIS — D229 Melanocytic nevi, unspecified: Secondary | ICD-10-CM

## 2020-08-08 DIAGNOSIS — Z8041 Family history of malignant neoplasm of ovary: Secondary | ICD-10-CM | POA: Diagnosis not present

## 2020-08-08 DIAGNOSIS — Z8051 Family history of malignant neoplasm of kidney: Secondary | ICD-10-CM

## 2020-08-08 DIAGNOSIS — Z803 Family history of malignant neoplasm of breast: Secondary | ICD-10-CM

## 2020-08-08 DIAGNOSIS — Z800 Family history of malignant neoplasm of digestive organs: Secondary | ICD-10-CM

## 2020-08-08 NOTE — Progress Notes (Signed)
REFERRING PROVIDER: Janyth Contes, MD [address] [place],  [place] [address]  PRIMARY PROVIDER:  Jerrol Banana., MD  PRIMARY REASON FOR VISIT:  1. Family history of ovarian cancer   2. Family history of uterine cancer   3. Family history of kidney cancer   4. Family history of colon cancer   5. Family history of breast cancer      HISTORY OF PRESENT ILLNESS:   Brandi [name], a 39 y.o. [name], was seen for a [facility] cancer genetics consultation at the request of Dr. Sandford Craze due to a family history of cancer.  Brandi [name] presents to clinic today to discuss the possibility of a hereditary predisposition to cancer, genetic testing, and to further clarify her future cancer risks, as well as potential cancer risks for family members.    Brandi [name] is a 39 y.o. [name] with no personal history of cancer.  She does report having several moles removed that were pre-cancerous and follows up with dermatology.    CANCER HISTORY:  Oncology History   No history exists.     RISK FACTORS:  Menarche was at age 81.  First live birth at age 59.  OCP use for approximately 10 years.  Ovaries intact: yes.  Hysterectomy: no.  Menopausal status: premenopausal.  HRT use: 0 years. Colonoscopy: yes; normal. Mammogram within the last year: yes. Number of breast biopsies: 0. Up to date with pelvic exams: yes. Any excessive radiation exposure in the past: no  Past Medical History:  Diagnosis Date  . Bronchitis    for last month.  started antibiotic 04/19/17  . Family history of breast cancer   . Family history of breast cancer   . Family history of colon cancer   . Family history of kidney cancer   . Family history of ovarian cancer   . Family history of uterine cancer   . Hypothyroidism   . Migraine headache    "seems r/t menstrual cycle"  . Motion sickness    all vehicles    Past Surgical History:  Procedure Laterality Date  . CESAREAN SECTION   04/2010  . CESAREAN SECTION N/A 01/10/2015   Procedure: CESAREAN SECTION;  Surgeon: Melina Schools, MD;  Location: [facility];  Service: Obstetrics;  Laterality: N/A;  . COLONOSCOPY    . COLONOSCOPY WITH PROPOFOL N/A 04/30/2017   Procedure: COLONOSCOPY WITH PROPOFOL;  Surgeon: Lucilla Lame, MD;  Location: [facility];  Service: Endoscopy;  Laterality: N/A;  . laprascopic     exploratory  . OTHER SURGICAL HISTORY  2015   IVF egg retrieval    Social History   Socioeconomic History  . Marital status: Married    Spouse name: Not on file  . Number of children: Not on file  . Years of education: Not on file  . Highest education level: Not on file  Occupational History  . Not on file  Tobacco Use  . Smoking status: Never Smoker  . Smokeless tobacco: Never Used  Substance and Sexual Activity  . Alcohol use: Yes    Alcohol/week: 4.0 standard drinks    Types: 4 Glasses of wine per week    Comment: rarely  . Drug use: No  . Sexual activity: Yes  Other Topics Concern  . Not on file  Social History Narrative  . Not on file   Social Determinants of Health   Financial Resource Strain:   . Difficulty of Paying Living Expenses: Not on file  Food Insecurity:   . Worried About Charity fundraiser in the Last Year: Not on file  . Ran Out of Food in the Last Year: Not on file  Transportation Needs:   . Lack of Transportation (Medical): Not on file  . Lack of Transportation (Non-Medical): Not on file  Physical Activity:   . Days of Exercise per Week: Not on file  . Minutes of Exercise per Session: Not on file  Stress:   . Feeling of Stress : Not on file  Social Connections:   . Frequency of Communication with Friends and Family: Not on file  . Frequency of Social Gatherings with Friends and Family: Not on file  . Attends Religious Services: Not on file  . Active Member of Clubs or Organizations: Not on file  . Attends Archivist Meetings: Not on file  . Marital Status:  Not on file     FAMILY HISTORY:  We obtained a detailed, 4-generation family history.  Significant diagnoses are listed below: Family History  Problem Relation Age of Onset  . Migraines Mother   . Hyperlipidemia Mother   . Anxiety disorder Mother 16  . Breast cancer Mother 45  . Hyperlipidemia Father   . Colon cancer Father   . Melanoma Father   . Kidney cancer Paternal Uncle   . Dementia Maternal Grandmother   . Breast cancer Maternal Grandmother 13  . Endometrial cancer Maternal Grandmother        dx 59s  . Stroke Maternal Grandfather   . Aneurysm Paternal Grandfather   . Alcohol abuse Neg Hx   . Arthritis Neg Hx   . Asthma Neg Hx   . Birth defects Neg Hx   . COPD Neg Hx   . Depression Neg Hx   . Diabetes Neg Hx   . Drug abuse Neg Hx   . Early death Neg Hx   . Hearing loss Neg Hx   . Heart disease Neg Hx   . Hypertension Neg Hx   . Kidney disease Neg Hx   . Learning disabilities Neg Hx   . Mental illness Neg Hx   . Mental retardation Neg Hx   . Miscarriages / Stillbirths Neg Hx   . Vision loss Neg Hx   . Varicose Veins Neg Hx    Brandi [name] has a daughter, 59 and a son, 5. She has 2 brothers, 68 and 65, no history of cancer.   Brandi [name] mother was diagnosed with breast cancer at 58 and reportedly had negative BRCA testing in 2010/2011. She does not have siblings. Patient's maternal grandmother had breast cancer in her 43s-60s and uterine cancer in her 41s, passed at 72. Maternal grandfather passed in his 46s due to heart attack.   Brandi [name] father has had several melanomas removed in his 20s/70s, areas where he had sun exposure. He also recently was diagnosed with stage I colon cancer at 64. Patient has 2 paternal uncles and 1 paternal aunt. Her uncle had metastatic kidney cancer and passed in his 69s. No known cancers in paternal cousins. Paternal grandmother died at 86, grandfather died in his 42s-80s. Paternal grandmother's mother had ovarian cancer and died in  her 29s.   Brandi [name] is unaware of previous family history of genetic testing for hereditary cancer risks. Patient's maternal ancestors are of [place] and [place] descent, and paternal ancestors are of [place] descent. There is no reported Ashkenazi Jewish ancestry. There is no known consanguinity.  GENETIC COUNSELING ASSESSMENT:  Brandi [name] is a 39 y.o. [name] with a family history of cancer which is somewhat suggestive of a hereditary cancer syndrome and predisposition to cancer. We, therefore, discussed and recommended the following at today's visit.   DISCUSSION: We discussed that 5-10% of cancer is hereditary meaning that it is due to a mutation in a single gene that is passed down from generation to generation in a family. Most cases of hereditary breast cancer are associated with BRCA1/BRCA2 genes, although there are other genes associated with hereditary  cancer as well, including genes associated with other cancers in her family such as colon, ovarian and kidney.  We discussed that testing is beneficial for several reasons including knowing about other cancer risks, identifying potential screening and risk-reduction options that may be appropriate, and to understand if other family members could be at risk for cancer and allow them to undergo genetic testing.   We reviewed the characteristics, features and inheritance patterns of hereditary cancer syndromes. We also discussed genetic testing, including the appropriate family members to test, the process of testing, insurance coverage and turn-around-time for results. We discussed the implications of a negative, positive and/or variant of uncertain significant result. We recommended Brandi [name] pursue genetic testing for the [facility] Multi-Cancer gene panel.   The Multi-Cancer Panel offered by [facility] includes sequencing and/or deletion duplication testing of the following 85 genes: AIP, ALK, APC, ATM, AXIN2,BAP1,  BARD1, BLM, BMPR1A, BRCA1, BRCA2,  BRIP1, CASR, CDC73, CDH1, CDK4, CDKN1B, CDKN1C, CDKN2A (p14ARF), CDKN2A (p16INK4a), CEBPA, CHEK2, CTNNA1, DICER1, DIS3L2, EGFR (c.2369C>T, p.Thr790Met variant only), EPCAM (Deletion/duplication testing only), FH, FLCN, GATA2, GPC3, GREM1 (Promoter region deletion/duplication testing only), HOXB13 (c.251G>A, p.Gly84Glu), HRAS, KIT, MAX, MEN1, MET, MITF (c.952G>A, p.Glu318Lys variant only), MLH1, MSH2, MSH3, MSH6, MUTYH, NBN, NF1, NF2, NTHL1, PALB2, PDGFRA, PHOX2B, PMS2, POLD1, POLE, POT1, PRKAR1A, PTCH1, PTEN, RAD50, RAD51C, RAD51D, RB1, RECQL4, RET, RNF43, RUNX1, SDHAF2, SDHA (sequence changes only), SDHB, SDHC, SDHD, SMAD4, SMARCA4, SMARCB1, SMARCE1, STK11, SUFU, TERC, TERT, TMEM127, TP53, TSC1, TSC2, VHL, WRN and WT1.   Based on Brandi [name]'s family history of cancer, she meets medical criteria for genetic testing. Despite that she meets criteria, she may still have an out of pocket cost.   PLAN: After considering the risks, benefits, and limitations, Brandi [name] provided informed consent to pursue genetic testing and the blood sample was sent to [facility] for analysis of the Multi-Cancer Panel. Results should be available within approximately 2-3 weeks' time, at which point they will be disclosed by telephone to Brandi [name], as will any additional recommendations warranted by these results. Brandi [name] will receive a summary of her genetic counseling visit and a copy of her results once available. This information will also be available in Epic.   Brandi [name] questions were answered to her satisfaction today. Our contact information was provided should additional questions or concerns arise. Thank you for the referral and allowing [place] to share in the care of your patient.   Faith Rogue, MS, [facility] Genetic Counselor [email or web address] Phone: [phone]  The patient was seen for a total of 60 minutes in face-to-face genetic counseling. [employer] Intern Brandi [name] was present and  assisted with the session. Dr. Grayland Ormond was available for discussion regarding this case.   _______________________________________________________________________ For Office Staff:  Number of people involved in session: 2 Was an Intern/ student involved with case: yes

## 2020-08-09 ENCOUNTER — Other Ambulatory Visit: Payer: Self-pay

## 2020-08-09 ENCOUNTER — Inpatient Hospital Stay: Payer: BC Managed Care – PPO

## 2020-08-09 DIAGNOSIS — Z1231 Encounter for screening mammogram for malignant neoplasm of breast: Secondary | ICD-10-CM | POA: Diagnosis not present

## 2020-08-19 ENCOUNTER — Other Ambulatory Visit: Payer: Self-pay | Admitting: Family Medicine

## 2020-08-19 NOTE — Telephone Encounter (Signed)
Requested medication (s) are due for refill today: yes  Requested medication (s) are on the active medication list: yes  Last refill:  09/15/19  Future visit scheduled: yes  Notes to clinic:  called pt and appt made. Routing to office    Requested Prescriptions  Pending Prescriptions Disp Refills   levothyroxine (SYNTHROID) 50 MCG tablet [Pharmacy Med Name: LEVOTHYROXINE 50 MCG TABLET] 90 tablet 23    Sig: TAKE 1 TABLET BY MOUTH DAILY BEFORE BREAKFAST      Endocrinology:  Hypothyroid Agents Failed - 08/19/2020  9:04 AM      Failed - TSH needs to be rechecked within 3 months after an abnormal result. Refill until TSH is due.      Failed - TSH in normal range and within 360 days    TSH  Date Value Ref Range Status  08/18/2019 1.720 0.450 - 4.500 uIU/mL Final          Failed - Valid encounter within last 12 months    Recent Outpatient Visits           6 months ago Constipation, unspecified constipation type   [facility] Jerrol Banana., MD   1 year ago Annual physical exam   [facility] Jerrol Banana., MD   1 year ago Pansinusitis, unspecified chronicity   [facility] Jerrol Banana., MD   2 years ago Annual physical exam   [facility] Jerrol Banana., MD   2 years ago URI with cough and congestion   [place], [place]       Future Appointments             In 2 weeks Jerrol Banana., MD [facility], [place]

## 2020-08-20 ENCOUNTER — Encounter: Payer: Self-pay | Admitting: Licensed Clinical Social Worker

## 2020-08-20 ENCOUNTER — Ambulatory Visit: Payer: Self-pay | Admitting: Licensed Clinical Social Worker

## 2020-08-20 ENCOUNTER — Telehealth: Payer: Self-pay | Admitting: Licensed Clinical Social Worker

## 2020-08-20 DIAGNOSIS — Z1379 Encounter for other screening for genetic and chromosomal anomalies: Secondary | ICD-10-CM

## 2020-08-20 DIAGNOSIS — Z8049 Family history of malignant neoplasm of other genital organs: Secondary | ICD-10-CM

## 2020-08-20 DIAGNOSIS — Z803 Family history of malignant neoplasm of breast: Secondary | ICD-10-CM

## 2020-08-20 DIAGNOSIS — Z8371 Family history of colonic polyps: Secondary | ICD-10-CM

## 2020-08-20 DIAGNOSIS — Z800 Family history of malignant neoplasm of digestive organs: Secondary | ICD-10-CM

## 2020-08-20 DIAGNOSIS — Z8041 Family history of malignant neoplasm of ovary: Secondary | ICD-10-CM

## 2020-08-20 DIAGNOSIS — Z8051 Family history of malignant neoplasm of kidney: Secondary | ICD-10-CM

## 2020-08-20 NOTE — Telephone Encounter (Signed)
Revealed negative genetic testing.  This normal result is reassuring.  It is unlikely that there is an increased risk of cancer due to a mutation in one of these genes.  However, genetic testing is not perfect, and cannot definitively rule out a hereditary cause.  It will be important for her to keep in contact with genetics to learn if any additional testing may be needed in the future.      

## 2020-08-20 NOTE — Progress Notes (Signed)
HPI:  Ms. Ramone was previously seen in the [facility] clinic due to a family history of cancer and concerns regarding a hereditary predisposition to cancer. Please refer to our prior cancer genetics clinic note for more information regarding our discussion, assessment and recommendations, at the time. Ms. Nestler recent genetic test results were disclosed to her, as were recommendations warranted by these results. These results and recommendations are discussed in more detail below.  CANCER HISTORY:  Oncology History   No history exists.    FAMILY HISTORY:  We obtained a detailed, 4-generation family history.  Significant diagnoses are listed below: Family History  Problem Relation Age of Onset  . Migraines Mother   . Hyperlipidemia Mother   . Anxiety disorder Mother 44  . Breast cancer Mother 43  . Hyperlipidemia Father   . Colon cancer Father   . Melanoma Father   . Kidney cancer Paternal Uncle   . Dementia Maternal Grandmother   . Breast cancer Maternal Grandmother 39  . Endometrial cancer Maternal Grandmother        dx 92s  . Stroke Maternal Grandfather   . Aneurysm Paternal Grandfather   . Alcohol abuse Neg Hx   . Arthritis Neg Hx   . Asthma Neg Hx   . Birth defects Neg Hx   . COPD Neg Hx   . Depression Neg Hx   . Diabetes Neg Hx   . Drug abuse Neg Hx   . Early death Neg Hx   . Hearing loss Neg Hx   . Heart disease Neg Hx   . Hypertension Neg Hx   . Kidney disease Neg Hx   . Learning disabilities Neg Hx   . Mental illness Neg Hx   . Mental retardation Neg Hx   . Miscarriages / Stillbirths Neg Hx   . Vision loss Neg Hx   . Varicose Veins Neg Hx     Ms. Amy has a daughter, 46 and a son, 5. She has 2 brothers, 62 and 27, no history of cancer.   Ms. Belfiore mother was diagnosed with breast cancer at 23 and reportedly had negative BRCA testing in 2010/2011. She does not have siblings. Patient's maternal grandmother had breast cancer in her  77s-60s and uterine cancer in her 19s, passed at 67. Maternal grandfather passed in his 80s due to heart attack.   Ms. Vales father has had several melanomas removed in his 52s/70s, areas where he had sun exposure. He also recently was diagnosed with stage I colon cancer at 41. Patient has 2 paternal uncles and 1 paternal aunt. Her uncle had metastatic kidney cancer and passed in his 78s. No known cancers in paternal cousins. Paternal grandmother died at 34, grandfather died in his 7s-80s. Paternal grandmother's mother had ovarian cancer and died in her 72s.   Ms. Bogie is unaware of previous family history of genetic testing for hereditary cancer risks. Patient's maternal ancestors are of [place] and [place] descent, and paternal ancestors are of [place] descent. There is no reported Ashkenazi Jewish ancestry. There is no known consanguinity.  GENETIC TEST RESULTS: Genetic testing reported out on 08/18/2020 through the [facility] Multi- cancer panel found no pathogenic mutations.   The Multi-Cancer Panel offered by [facility] includes sequencing and/or deletion duplication testing of the following 85 genes: AIP, ALK, APC, ATM, AXIN2,BAP1,  BARD1, BLM, BMPR1A, BRCA1, BRCA2, BRIP1, CASR, CDC73, CDH1, CDK4, CDKN1B, CDKN1C, CDKN2A (p14ARF), CDKN2A (p16INK4a), CEBPA, CHEK2, CTNNA1, DICER1, DIS3L2, EGFR (c.2369C>T, p.Thr790Met variant only),  EPCAM (Deletion/duplication testing only), FH, FLCN, GATA2, GPC3, GREM1 (Promoter region deletion/duplication testing only), HOXB13 (c.251G>A, p.Gly84Glu), HRAS, KIT, MAX, MEN1, MET, MITF (c.952G>A, p.Glu318Lys variant only), MLH1, MSH2, MSH3, MSH6, MUTYH, NBN, NF1, NF2, NTHL1, PALB2, PDGFRA, PHOX2B, PMS2, POLD1, POLE, POT1, PRKAR1A, PTCH1, PTEN, RAD50, RAD51C, RAD51D, RB1, RECQL4, RET, RNF43, RUNX1, SDHAF2, SDHA (sequence changes only), SDHB, SDHC, SDHD, SMAD4, SMARCA4, SMARCB1, SMARCE1, STK11, SUFU, TERC, TERT, TMEM127, TP53, TSC1, TSC2, VHL, WRN and WT1.   The test report  has been scanned into EPIC and is located under the Molecular Pathology section of the Results Review tab.  A portion of the result report is included below for reference.     We discussed with Ms. Resor that because current genetic testing is not perfect, it is possible there may be a gene mutation in one of these genes that current testing cannot detect, but that chance is small.  We also discussed, that there could be another gene that has not yet been discovered, or that we have not yet tested, that is responsible for the cancer diagnoses in the family. It is also possible there is a hereditary cause for the cancer in the family that Ms. Spicer did not inherit and therefore was not identified in her testing.  Therefore, it is important to remain in touch with cancer genetics in the future so that we can continue to offer Ms. Yambao the most up to date genetic testing.   ADDITIONAL GENETIC TESTING: We discussed with Ms. Hockley that her genetic testing was fairly extensive.  If there are genes identified to increase cancer risk that can be analyzed in the future, we would be happy to discuss and coordinate this testing at that time.    CANCER SCREENING RECOMMENDATIONS: Ms. Vitullo test result is considered negative (normal).  This means that we have not identified a hereditary cause for her  family history of cancer at this time.  While reassuring, this does not definitively rule out a hereditary predisposition to cancer. It is still possible that there could be genetic mutations that are undetectable by current technology. There could be genetic mutations in genes that have not been tested or identified to increase cancer risk.  Therefore, it is recommended she continue to follow the cancer management and screening guidelines provided by her  primary healthcare provider.   An individual's cancer risk and medical management are not determined by genetic test results alone. Overall cancer risk  assessment incorporates additional factors, including personal medical history, family history, and any available genetic information that may result in a personalized plan for cancer prevention and surveillance.  Based on Ms. Krajewski's personal and family history of cancer as well as her genetic test results, risk model Harriett Rush was used to estimate her risk of developing breast cancer. This estimates her lifetime risk of developing breast cancer to be approximately 29.2%.  The patient's lifetime breast cancer risk is a preliminary estimate based on available information using one of several models endorsed by the [place] (ACS). The ACS recommends consideration of breast MRI screening as an adjunct to mammography for patients at high risk (defined as 20% or greater lifetime risk).     We, therefore, discussed that it is reasonable for Ms. Forti to be followed by a high-risk breast cancer clinic; in addition to a yearly mammogram and physical exam by a healthcare provider, she should discuss the usefulness of an annual breast MRI with the high-risk clinic providers. We offered referral to  our high risk breast clinic; patient would prefer to speak with her care team first.   RECOMMENDATIONS FOR FAMILY MEMBERS:  Relatives in this family might be at some increased risk of developing cancer, over the general population risk, simply due to the family history of cancer.  We recommended female relatives in this family have a yearly mammogram beginning at age 13, or 45 years younger than the earliest onset of cancer, an annual clinical breast exam, and perform monthly breast self-exams. Female relatives in this family should also have a gynecological exam as recommended by their primary provider.  All family members should be referred for colonoscopy starting at age 79.    It is also possible there is a hereditary cause for the cancer in Ms. Schwarting's family that she did not inherit and  therefore was not identified in her.  Based on Ms. Dinning's family history, we recommended paternal relatives have genetic counseling and testing. Ms. Mcpeters will let us know if we can be of any assistance in coordinating genetic counseling and/or testing for these family members.  FOLLOW-UP: Lastly, we discussed with Ms. Nessel that cancer genetics is a rapidly advancing field and it is possible that new genetic tests will be appropriate for her and/or her family members in the future. We encouraged her to remain in contact with cancer genetics on an annual basis so we can update her personal and family histories and let her know of advances in cancer genetics that may benefit this family.   Our contact number was provided. Ms. Farrelly questions were answered to her satisfaction, and she knows she is welcome to call us at anytime with additional questions or concerns.   Faith Rogue, MS, [facility] Genetic Counselor [email or web address] Phone: [phone]

## 2020-09-04 ENCOUNTER — Other Ambulatory Visit: Payer: Self-pay | Admitting: Family Medicine

## 2020-09-04 DIAGNOSIS — F419 Anxiety disorder, unspecified: Secondary | ICD-10-CM

## 2020-09-04 NOTE — Telephone Encounter (Signed)
Requested Prescriptions  Pending Prescriptions Disp Refills  . sertraline (ZOLOFT) 50 MG tablet [Pharmacy Med Name: SERTRALINE HCL 50 MG TABLET] 135 tablet 1    Sig: TAKE 1 AND 1/2 TABLETS DAILY BY MOUTH     Psychiatry:  Antidepressants - SSRI Failed - 09/04/2020  1:23 AM      Failed - Valid encounter within last 6 months    Recent Outpatient Visits          7 months ago Constipation, unspecified constipation type   [facility] Jerrol Banana., MD   1 year ago Annual physical exam   [facility] Jerrol Banana., MD   1 year ago Pansinusitis, unspecified chronicity   [facility] Jerrol Banana., MD   2 years ago Annual physical exam   [facility] Jerrol Banana., MD   2 years ago URI with cough and congestion   [place], [place], [place]      Future Appointments            Tomorrow Jerrol Banana., MD [facility], [place]

## 2020-09-05 ENCOUNTER — Encounter: Payer: Self-pay | Admitting: Family Medicine

## 2020-09-05 ENCOUNTER — Other Ambulatory Visit: Payer: Self-pay

## 2020-09-05 ENCOUNTER — Ambulatory Visit: Payer: BC Managed Care – PPO | Admitting: Family Medicine

## 2020-09-05 VITALS — BP 112/77 | HR 72 | Temp 97.9°F | Ht 66.0 in | Wt 140.6 lb

## 2020-09-05 DIAGNOSIS — J309 Allergic rhinitis, unspecified: Secondary | ICD-10-CM | POA: Diagnosis not present

## 2020-09-05 DIAGNOSIS — Z800 Family history of malignant neoplasm of digestive organs: Secondary | ICD-10-CM

## 2020-09-05 DIAGNOSIS — E039 Hypothyroidism, unspecified: Secondary | ICD-10-CM | POA: Diagnosis not present

## 2020-09-05 DIAGNOSIS — Z23 Encounter for immunization: Secondary | ICD-10-CM | POA: Diagnosis not present

## 2020-09-05 DIAGNOSIS — F419 Anxiety disorder, unspecified: Secondary | ICD-10-CM | POA: Diagnosis not present

## 2020-09-05 DIAGNOSIS — N979 Female infertility, unspecified: Secondary | ICD-10-CM | POA: Insufficient documentation

## 2020-09-05 DIAGNOSIS — Z98891 History of uterine scar from previous surgery: Secondary | ICD-10-CM

## 2020-09-05 NOTE — Progress Notes (Signed)
Established patient visit   Patient: Brandi [name]   DOB: 06/24/1981   39 y.o. Female  MRN: [id number] Visit Date: 09/05/2020  Today's healthcare provider: Wilhemena Durie, MD   Chief Complaint  Patient presents with  . Hypothyroidism   Subjective    HPI  Pt doing well and feels well. Anxiety controlled.No c/o. Hypothyroid, follow-up  Lab Results  Component Value Date   TSH 1.720 08/18/2019   TSH 2.110 08/12/2018   TSH 1.080 08/27/2016   Wt Readings from Last 3 Encounters:  09/05/20 140 lb 9.6 oz (63.8 kg)  08/18/19 143 lb (64.9 kg)  02/14/19 140 lb (63.5 kg)    She was last seen for hypothyroid 7 months ago.  Management since that visit includes; on levothyroxine 50 mcg. She reports good compliance with treatment. She is not having side effects.   -----------------------------------------------------------------------------------------       Medications: Outpatient Medications Prior to Visit  Medication Sig  . levothyroxine (SYNTHROID) 50 MCG tablet TAKE 1 TABLET BY MOUTH DAILY BEFORE BREAKFAST  . loratadine (CLARITIN) 10 MG tablet TAKE 1 TABLET BY MOUTH EVERY DAY  . sertraline (ZOLOFT) 50 MG tablet TAKE 1 AND 1/2 TABLETS DAILY BY MOUTH  . SUMAtriptan (IMITREX) 50 MG tablet Take 50 mg by mouth every 2 (two) hours as needed for migraine. May repeat in 2 hours if headache persists or recurs. (Patient not taking: Reported on 09/05/2020)  . [DISCONTINUED] azithromycin (ZITHROMAX) 250 MG tablet Take 2 by mouth on day 1 and then 1 daily (Patient not taking: Reported on 01/23/2020)  . [DISCONTINUED] doxycycline (VIBRA-TABS) 100 MG tablet doxycycline hyclate 100 mg tablet   No facility-administered medications prior to visit.    Review of Systems  Constitutional: Negative for appetite change, chills, fatigue and fever.  Respiratory: Negative for chest tightness and shortness of breath.   Cardiovascular: Negative for chest pain and palpitations.    Gastrointestinal: Negative for abdominal pain, nausea and vomiting.  Neurological: Negative for dizziness and weakness.       Objective    BP 112/77 (BP Location: Right Arm, Patient Position: Sitting, Cuff Size: Normal)   Pulse 72   Temp 97.9 F (36.6 C) (Oral)   Ht 5\' 6"  (1.676 m)   Wt 140 lb 9.6 oz (63.8 kg)   SpO2 100%   BMI 22.69 kg/m  Wt Readings from Last 3 Encounters:  09/05/20 140 lb 9.6 oz (63.8 kg)  08/18/19 143 lb (64.9 kg)  02/14/19 140 lb (63.5 kg)      Physical Exam Vitals reviewed.  HENT:     Head: Normocephalic and atraumatic.     Right Ear: External ear normal.     Left Ear: External ear normal.  Eyes:     General: No scleral icterus.    Conjunctiva/sclera: Conjunctivae normal.  Cardiovascular:     Rate and Rhythm: Normal rate and regular rhythm.     Heart sounds: Normal heart sounds.  Pulmonary:     Effort: Pulmonary effort is normal.     Breath sounds: Normal breath sounds.  Lymphadenopathy:     Cervical: No cervical adenopathy.  Skin:    General: Skin is warm and dry.  Neurological:     General: No focal deficit present.     Mental Status: She is alert and oriented to person, place, and time.  Psychiatric:        Mood and Affect: Mood normal.        Behavior: Behavior normal.  Thought Content: Thought content normal.        Judgment: Judgment normal.       No results found for any visits on 09/05/20.  Assessment & Plan     1. Acquired hypothyroidism  - TSH  2. Anxiety Continue sertraline indefinitely. - sertraline (ZOLOFT) 50 MG tablet; TAKE 1 AND 1/2 TABLETS DAILY BY MOUTH  Dispense: 135 tablet; Refill: 3  3. Family history of colon cancer Patient has had a initial colonoscopy.  Follow-up at 5-year interval in 2023  4. Allergic rhinitis, unspecified seasonality, unspecified trigger Reddening  5. S/P cesarean section Followed by GYN for well woman care   No follow-ups on file.         Bryana Froemming Cranford Mon, MD   [facility] [phone] (phone) [phone] (fax)  [place]

## 2020-09-11 MED ORDER — SERTRALINE HCL 50 MG PO TABS: ORAL_TABLET | ORAL | 3 refills | Status: DC

## 2020-09-11 MED ORDER — LORATADINE 10 MG PO TABS: 10.0000 mg | ORAL_TABLET | Freq: Every day | ORAL | 3 refills | Status: DC

## 2020-09-11 MED ORDER — LEVOTHYROXINE SODIUM 50 MCG PO TABS: 50.0000 ug | ORAL_TABLET | Freq: Every day | ORAL | 3 refills | Status: DC

## 2020-09-25 DIAGNOSIS — L700 Acne vulgaris: Secondary | ICD-10-CM | POA: Diagnosis not present

## 2020-09-25 DIAGNOSIS — D2262 Melanocytic nevi of left upper limb, including shoulder: Secondary | ICD-10-CM | POA: Diagnosis not present

## 2020-09-25 DIAGNOSIS — D2261 Melanocytic nevi of right upper limb, including shoulder: Secondary | ICD-10-CM | POA: Diagnosis not present

## 2020-09-25 DIAGNOSIS — D225 Melanocytic nevi of trunk: Secondary | ICD-10-CM | POA: Diagnosis not present

## 2020-11-25 ENCOUNTER — Other Ambulatory Visit: Payer: Self-pay | Admitting: Family Medicine

## 2020-11-29 ENCOUNTER — Telehealth: Payer: Self-pay

## 2020-11-29 NOTE — Telephone Encounter (Signed)
ok 

## 2020-11-29 NOTE — Telephone Encounter (Signed)
Pharmacy requesting to change Levothyroxine brand from [employer] which has been out of stock to [employer]. Please advise.

## 2020-12-03 NOTE — Telephone Encounter (Signed)
Called [facility]

## 2021-04-07 DIAGNOSIS — Z20822 Contact with and (suspected) exposure to covid-19: Secondary | ICD-10-CM | POA: Diagnosis not present

## 2021-04-25 ENCOUNTER — Telehealth: Payer: Self-pay

## 2021-04-25 NOTE — Telephone Encounter (Signed)
Copied from [place] [id number]. Topic: Quick Communication - Rx Refill/Question >> Apr 25, 2021 11:10 AM Erick Blinks wrote: Pt received a letter from her insurance detailing that more information was needed from her PCP for them to cover her  Needed before July 1st.  sertraline (ZOLOFT) 50 MG tablet  Pt is scheduled for next available CPE, please advise if sooner appt is needed.   Best contact: [phone]

## 2021-05-01 NOTE — Telephone Encounter (Signed)
Returned call to pt, no answer and no vm. We need a copy of the letter.

## 2021-05-03 NOTE — Telephone Encounter (Signed)
Returned call to pt, no answer and no vm.

## 2021-05-07 ENCOUNTER — Encounter: Payer: Self-pay | Admitting: Family Medicine

## 2021-05-07 NOTE — Telephone Encounter (Signed)
Unable to contact patient after several attempts. Left message to call back.

## 2021-05-21 ENCOUNTER — Ambulatory Visit: Payer: Self-pay | Admitting: *Deleted

## 2021-05-21 NOTE — Telephone Encounter (Signed)
FYI

## 2021-05-21 NOTE — Telephone Encounter (Signed)
Patient is calling to report she is having tailbone pain- (no injury) and constipation- trouble moving bowels. Patient states she does not know why she is having these symptoms- they are new for her. Patient is loading car and driving so triage was hard to do- patient falls into be seen within 3 days disposition and there are no open appointments - call sent for review and possible sooner appointment.

## 2021-05-21 NOTE — Telephone Encounter (Signed)
Recommend Miralax qd for constipation. If unable to pass stool, may need to use Fleets Enema or get evaluation at an Urgent Care Clinic.

## 2021-05-21 NOTE — Telephone Encounter (Signed)
Summary: Clinical Advice   Patient experiencing constipation and tailbone pain for 3 days, patient scheduled a appointment with Brandi [name] at [facility] on 05/31/2021 but seeking clinical advice prior to appointment, please follow up at  [phone]      Reason for Disposition . [1] MODERATE back pain (e.g., interferes with normal activities) AND [2] present > 3 days . [1] Constipation persists > 1 week AND [2] no improvement after using CARE ADVICE  Answer Assessment - Initial Assessment Questions 1. STOOL PATTERN OR FREQUENCY: "How often do you have a bowel movement (BM)?"  (Normal range: 3 times a day to every 3 days)  "When was your last BM?"       1-2 times/day, last BM- today 2. STRAINING: "Do you have to strain to have a BM?"      yes 3. RECTAL PAIN: "Does your rectum hurt when the stool comes out?" If Yes, ask: "Do you have hemorrhoids? How bad is the pain?"  (Scale 1-10; or mild, moderate, severe)     No rectal pain- no hemorrhoids 4. STOOL COMPOSITION: "Are the stools hard?"      Not hard- just not moving 5. BLOOD ON STOOLS: "Has there been any blood on the toilet tissue or on the surface of the BM?" If Yes, ask: "When was the last time?"      no 6. CHRONIC CONSTIPATION: "Is this a new problem for you?"  If no, ask: "How long have you had this problem?" (days, weeks, months)      New for patient- 2 weeks 7. CHANGES IN DIET OR HYDRATION: "Have there been any recent changes in your diet?" "How much fluids are you drinking on a daily basis?"  "How much have you had to drink today?"     No changes in diet- no changes hydration 8. MEDICATIONS: "Have you been taking any new medications?" "Are you taking any narcotic pain medications?" (e.g., Vicodin, Percocet, morphine, Dilaudid)     Spirolactone- started 1-2 months 9. LAXATIVES: "Have you been using any stool softeners, laxatives, or enemas?"  If yes, ask "What, how often, and when was the last time?"     No- did get apple cider vinegar pills 10.  ACTIVITY:  "How much walking do you do every day?"  "Has your activity level decreased in the past week?"        Active in job and children 11. CAUSE: "What do you think is causing the constipation?"        Not sure what is causing the change in bowels 12. OTHER SYMPTOMS: "Do you have any other symptoms?" (e.g., abdominal pain, bloating, fever, vomiting)       Tailbone pain, bloating 13. MEDICAL HISTORY: "Do you have a history of hemorrhoids, rectal fissures, or rectal surgery or rectal abscess?"         no 14. PREGNANCY: "Is there any chance you are pregnant?" "When was your last menstrual period?"       No- LMP- now  Answer Assessment - Initial Assessment Questions 1. ONSET: "When did the pain begin?"      2 days 2. LOCATION: "Where does it hurt?" (upper, mid or lower back)     Lower sacrum 3. SEVERITY: "How bad is the pain?"  (e.g., Scale 1-10; mild, moderate, or severe)   - MILD (1-3): doesn't interfere with normal activities    - MODERATE (4-7): interferes with normal activities or awakens from sleep    - SEVERE (8-10): excruciating pain, unable to do  any normal activities      6-7 4. PATTERN: "Is the pain constant?" (e.g., yes, no; constant, intermittent)      constant 5. RADIATION: "Does the pain shoot into your legs or elsewhere?"     unsure 6. CAUSE:  "What do you think is causing the back pain?"      unsure 7. BACK OVERUSE:  "Any recent lifting of heavy objects, strenuous work or exercise?"     no 8. MEDICATIONS: "What have you taken so far for the pain?" (e.g., nothing, acetaminophen, NSAIDS)     Ibuprofen today 9. NEUROLOGIC SYMPTOMS: "Do you have any weakness, numbness, or problems with bowel/bladder control?"     Frequent urination 10. OTHER SYMPTOMS: "Do you have any other symptoms?" (e.g., fever, abdominal pain, burning with urination, blood in urine)       no 11. PREGNANCY: "Is there any chance you are pregnant?" (e.g., yes, no; LMP)       No- LMP-  now  Protocols used: Constipation-A-AH, Back Pain-A-AH

## 2021-05-22 NOTE — Telephone Encounter (Signed)
Returned call to pt. No answer and no vm. Will try and later. Okay for pec triage to advise pt.

## 2021-05-31 ENCOUNTER — Ambulatory Visit: Payer: BC Managed Care – PPO | Admitting: Family Medicine

## 2021-05-31 ENCOUNTER — Encounter: Payer: Self-pay | Admitting: Family Medicine

## 2021-05-31 ENCOUNTER — Other Ambulatory Visit: Payer: Self-pay

## 2021-05-31 VITALS — BP 110/79 | HR 62 | Temp 98.2°F | Resp 16 | Wt 149.0 lb

## 2021-05-31 DIAGNOSIS — M545 Low back pain, unspecified: Secondary | ICD-10-CM | POA: Diagnosis not present

## 2021-05-31 DIAGNOSIS — E039 Hypothyroidism, unspecified: Secondary | ICD-10-CM | POA: Diagnosis not present

## 2021-05-31 DIAGNOSIS — K5900 Constipation, unspecified: Secondary | ICD-10-CM | POA: Diagnosis not present

## 2021-05-31 LAB — POCT URINALYSIS DIPSTICK
Bilirubin, UA: NEGATIVE
Blood, UA: NEGATIVE
Glucose, UA: NEGATIVE
Ketones, UA: NEGATIVE
Leukocytes, UA: NEGATIVE
Nitrite, UA: NEGATIVE
Protein, UA: NEGATIVE
Spec Grav, UA: 1.01 (ref 1.010–1.025)
Urobilinogen, UA: 0.2 E.U./dL
pH, UA: 7.5 (ref 5.0–8.0)

## 2021-05-31 NOTE — Progress Notes (Signed)
Established patient visit   Patient: Brandi [name]   DOB: [birth year]   40 y.o. Female  MRN: [id number] Visit Date: 05/31/2021  Today's healthcare provider: Vernie Murders, PA-C   Chief Complaint  Patient presents with   Constipation   Subjective    Constipation The current episode started 1 to 4 weeks ago. The problem has been waxing and waning since onset. Her stool frequency is 2 to 3 times per week. The stool is described as firm. She Exercises regularly. Associated symptoms include back pain, bloating and nausea. Pertinent negatives include no hemorrhoids or vomiting. She has tried fiber for the symptoms. The treatment provided mild relief.     Past Medical History:  Diagnosis Date   Bronchitis    for last month.  started antibiotic 04/19/17   Family history of breast cancer    Family history of breast cancer    Family history of colon cancer    Family history of kidney cancer    Family history of ovarian cancer    Family history of uterine cancer    Hypothyroidism    Migraine headache    "seems r/t menstrual cycle"   Motion sickness    all vehicles   Past Surgical History:  Procedure Laterality Date   CESAREAN SECTION  04/2010   CESAREAN SECTION N/A 01/10/2015   Procedure: CESAREAN SECTION;  Surgeon: Melina Schools, MD;  Location: [facility];  Service: Obstetrics;  Laterality: N/A;   COLONOSCOPY     COLONOSCOPY WITH PROPOFOL N/A 04/30/2017   Procedure: COLONOSCOPY WITH PROPOFOL;  Surgeon: Lucilla Lame, MD;  Location: [facility];  Service: Endoscopy;  Laterality: N/A;   laprascopic     exploratory   OTHER SURGICAL HISTORY  2015   IVF egg retrieval   Social History   Tobacco Use   Smoking status: Never   Smokeless tobacco: Never  Substance Use Topics   Alcohol use: Yes    Alcohol/week: 4.0 standard drinks    Types: 4 Glasses of wine per week    Comment: rarely   Drug use: No   Family Status  Relation Name Status   Mother  Alive   Father   Alive   Brother  Alive   Pat Uncle  Deceased   MGM  Alive   MGF  Deceased   PGM  Deceased   PGF  Deceased   Brother  Alive   Pat Aunt  Alive   Son  Alive   Daughter  Alive   Neg Hx  (Not Specified)   Allergies  Allergen Reactions   Adhesive [Tape] Rash    Some bandaids   Neosporin [Neomycin-Bacitracin Zn-Polymyx] Rash   Sulfa Antibiotics Rash    Medications: Outpatient Medications Prior to Visit  Medication Sig   levothyroxine (SYNTHROID) 50 MCG tablet TAKE 1 TABLET BY MOUTH DAILY BEFORE BREAKFAST   loratadine (CLARITIN) 10 MG tablet Take 1 tablet (10 mg total) by mouth daily.   sertraline (ZOLOFT) 50 MG tablet TAKE 1 AND 1/2 TABLETS DAILY BY MOUTH   SUMAtriptan (IMITREX) 50 MG tablet Take 50 mg by mouth every 2 (two) hours as needed for migraine. May repeat in 2 hours if headache persists or recurs. (Patient not taking: Reported on 09/05/2020)   No facility-administered medications prior to visit.    Review of Systems  Gastrointestinal:  Positive for bloating, constipation and nausea. Negative for hemorrhoids and vomiting.  Musculoskeletal:  Positive for back pain.  Objective    BP 110/79   Pulse 62   Temp 98.2 F (36.8 C)   Resp 16   Wt 149 lb (67.6 kg)   BMI 24.05 kg/m     Physical Exam Constitutional:      General: She is not in acute distress.    Appearance: She is well-developed.  HENT:     Head: Normocephalic and atraumatic.     Right Ear: Hearing and tympanic membrane normal.     Left Ear: Hearing and tympanic membrane normal.     Nose: Nose normal.  Eyes:     General: Lids are normal. No scleral icterus.       Right eye: No discharge.        Left eye: No discharge.     Conjunctiva/sclera: Conjunctivae normal.  Cardiovascular:     Rate and Rhythm: Normal rate and regular rhythm.     Pulses: Normal pulses.     Heart sounds: Normal heart sounds.  Pulmonary:     Effort: Pulmonary effort is normal. No respiratory distress.     Breath  sounds: Normal breath sounds.  Abdominal:     General: Bowel sounds are normal.     Palpations: Abdomen is soft.  Musculoskeletal:        General: Normal range of motion.     Cervical back: Normal range of motion.     Comments: Tightness and ache in the lower lumbar/sacrum area. DTR's symmetric and no muscle weakness.  Skin:    Findings: No lesion or rash.  Neurological:     Mental Status: She is alert and oriented to person, place, and time.  Psychiatric:        Speech: Speech normal.        Behavior: Behavior normal.        Thought Content: Thought content normal.      No results found for any visits on 05/31/21.  Assessment & Plan     1. Constipation, unspecified constipation type Intermittent over the past 1-4 weeks. Encouraged to increase water intake and use fiber or stool softener daily. No abdominal pain, hematemesis or melena. Given handout for constipation.  2. Hypothyroidism, unspecified type Presently on Levothyroxine 50 mcg qd. Will check labs for signs of worsening hypothyroidism that could cause constipation. - Comprehensive metabolic panel - TSH - T4 - CBC with Differential/Platelet  3. Midline low back pain without sciatica, unspecified chronicity Mild soreness in lower back without known injury. No radiation of aching pain. Good ROM and DTR's. Urinalysis negative for infection or hematuria. May use Aspercreme with Lidocaine cream or moist heat applications with stretching exercises. Recheck prn. - POCT urinalysis dipstick   No follow-ups on file.      I, Evangelia Whitaker, PA-C, have reviewed all documentation for this visit. The documentation on 05/31/21 for the exam, diagnosis, procedures, and orders are all accurate and complete.    Vernie Murders, PA-C  [employer] [phone] (phone) [phone] (fax)  [place]

## 2021-05-31 NOTE — Patient Instructions (Signed)
Back Exercises The following exercises strengthen the muscles that help to support the trunk and back. They also help to keep the lower back flexible. Doing these exercises can help to prevent back pain or lessen existing pain. If you have back pain or discomfort, try doing these exercises 2-3 times each day or as told by your health care provider. As your pain improves, do them once each day, but increase the number of times that you repeat the steps for each exercise (do more repetitions). To prevent the recurrence of back pain, continue to do these exercises once each day or as told by your health care provider. Do exercises exactly as told by your health care provider and adjust them as directed. It is normal to feel mild stretching, pulling, tightness, or discomfort as you do these exercises, but you should stop right away if youfeel sudden pain or your pain gets worse. Exercises Single knee to chest Repeat these steps 3-5 times for each leg: Lie on your back on a firm bed or the floor with your legs extended. Bring one knee to your chest. Your other leg should stay extended and in contact with the floor. Hold your knee in place by grabbing your knee or thigh with both hands and hold. Pull on your knee until you feel a gentle stretch in your lower back or buttocks. Hold the stretch for 10-30 seconds. Slowly release and straighten your leg. Pelvic tilt Repeat these steps 5-10 times: Lie on your back on a firm bed or the floor with your legs extended. Bend your knees so they are pointing toward the ceiling and your feet are flat on the floor. Tighten your lower abdominal muscles to press your lower back against the floor. This motion will tilt your pelvis so your tailbone points up toward the ceiling instead of pointing to your feet or the floor. With gentle tension and even breathing, hold this position for 5-10 seconds. Cat-cow Repeat these steps until your lower back becomes more  flexible: Get into a hands-and-knees position on a firm surface. Keep your hands under your shoulders, and keep your knees under your hips. You may place padding under your knees for comfort. Let your head hang down toward your chest. Contract your abdominal muscles and point your tailbone toward the floor so your lower back becomes rounded like the back of a cat. Hold this position for 5 seconds. Slowly lift your head, let your abdominal muscles relax and point your tailbone up toward the ceiling so your back forms a sagging arch like the back of a cow. Hold this position for 5 seconds.  Press-ups Repeat these steps 5-10 times: Lie on your abdomen (face-down) on the floor. Place your palms near your head, about shoulder-width apart. Keeping your back as relaxed as possible and keeping your hips on the floor, slowly straighten your arms to raise the top half of your body and lift your shoulders. Do not use your back muscles to raise your upper torso. You may adjust the placement of your hands to make yourself more comfortable. Hold this position for 5 seconds while you keep your back relaxed. Slowly return to lying flat on the floor.  Bridges Repeat these steps 10 times: Lie on your back on a firm surface. Bend your knees so they are pointing toward the ceiling and your feet are flat on the floor. Your arms should be flat at your sides, next to your body. Tighten your buttocks muscles and lift your   buttocks off the floor until your waist is at almost the same height as your knees. You should feel the muscles working in your buttocks and the back of your thighs. If you do not feel these muscles, slide your feet 1-2 inches farther away from your buttocks. Hold this position for 3-5 seconds. Slowly lower your hips to the starting position, and allow your buttocks muscles to relax completely. If this exercise is too easy, try doing it with your arms crossed over yourchest. Abdominal  crunches Repeat these steps 5-10 times: Lie on your back on a firm bed or the floor with your legs extended. Bend your knees so they are pointing toward the ceiling and your feet are flat on the floor. Cross your arms over your chest. Tip your chin slightly toward your chest without bending your neck. Tighten your abdominal muscles and slowly raise your trunk (torso) high enough to lift your shoulder blades a tiny bit off the floor. Avoid raising your torso higher than that because it can put too much stress on your low back and does not help to strengthen your abdominal muscles. Slowly return to your starting position. Back lifts Repeat these steps 5-10 times: Lie on your abdomen (face-down) with your arms at your sides, and rest your forehead on the floor. Tighten the muscles in your legs and your buttocks. Slowly lift your chest off the floor while you keep your hips pressed to the floor. Keep the back of your head in line with the curve in your back. Your eyes should be looking at the floor. Hold this position for 3-5 seconds. Slowly return to your starting position. Contact a health care provider if: Your back pain or discomfort gets much worse when you do an exercise. Your worsening back pain or discomfort does not lessen within 2 hours after you exercise. If you have any of these problems, stop doing these exercises right away. Do not do them again unless your health care provider says that you can. Get help right away if: You develop sudden, severe back pain. If this happens, stop doing the exercises right away. Do not do them again unless your health care provider says that you can. This information is not intended to replace advice given to you by your health care provider. Make sure you discuss any questions you have with your healthcare provider. Document Revised: 03/17/2019 Document Reviewed: 08/12/2018 Elsevier Patient Education  [place]. Acute Back Pain, Adult Acute  back pain is sudden and usually short-lived. It is often caused by an injury to the muscles and tissues in the back. The injury may result from: A muscle or ligament getting overstretched or torn (strained). Ligaments are tissues that connect bones to each other. Lifting something improperly can cause a back strain. Wear and tear (degeneration) of the spinal disks. Spinal disks are circular tissue that provide cushioning between the bones of the spine (vertebrae). Twisting motions, such as while playing sports or doing yard work. A hit to the back. Arthritis. You may have a physical exam, lab tests, and imaging tests to find the cause ofyour pain. Acute back pain usually goes away with rest and home care. Follow these instructions at home: Managing pain, stiffness, and swelling Treatment may include medicines for pain and inflammation that are taken by mouth or applied to the skin, prescription pain medicine, or muscle relaxants. Take over-the-counter and prescription medicines only as told by your health care provider. Your health care provider may recommend applying  ice during the first 24-48 hours after your pain starts. To do this: Put ice in a plastic bag. Place a towel between your skin and the bag. Leave the ice on for 20 minutes, 2-3 times a day. If directed, apply heat to the affected area as often as told by your health care provider. Use the heat source that your health care provider recommends, such as a moist heat pack or a heating pad. Place a towel between your skin and the heat source. Leave the heat on for 20-30 minutes. Remove the heat if your skin turns bright red. This is especially important if you are unable to feel pain, heat, or cold. You have a greater risk of getting burned. Activity  Do not stay in bed. Staying in bed for more than 1-2 days can delay your recovery. Sit up and stand up straight. Avoid leaning forward when you sit or hunching over when you stand. If you  work at a desk, sit close to it so you do not need to lean over. Keep your chin tucked in. Keep your neck drawn back, and keep your elbows bent at a 90-degree angle (right angle). Sit high and close to the steering wheel when you drive. Add lower back (lumbar) support to your car seat, if needed. Take short walks on even surfaces as soon as you are able. Try to increase the length of time you walk each day. Do not sit, drive, or stand in one place for more than 30 minutes at a time. Sitting or standing for long periods of time can put stress on your back. Do not drive or use heavy machinery while taking prescription pain medicine. Use proper lifting techniques. When you bend and lift, use positions that put less stress on your back: Grassflat your knees. Keep the load close to your body. Avoid twisting. Exercise regularly as told by your health care provider. Exercising helps your back heal faster and helps prevent back injuries by keeping muscles strong and flexible. Work with a physical therapist to make a safe exercise program, as recommended by your health care provider. Do any exercises as told by your physical therapist.  Lifestyle Maintain a healthy weight. Extra weight puts stress on your back and makes it difficult to have good posture. Avoid activities or situations that make you feel anxious or stressed. Stress and anxiety increase muscle tension and can make back pain worse. Learn ways to manage anxiety and stress, such as through exercise. General instructions Sleep on a firm mattress in a comfortable position. Try lying on your side with your knees slightly bent. If you lie on your back, put a pillow under your knees. Follow your treatment plan as told by your health care provider. This may include: Cognitive or behavioral therapy. Acupuncture or massage therapy. Meditation or yoga. Contact a health care provider if: You have pain that is not relieved with rest or medicine. You have  increasing pain going down into your legs or buttocks. Your pain does not improve after 2 weeks. You have pain at night. You lose weight without trying. You have a fever or chills. Get help right away if: You develop new bowel or bladder control problems. You have unusual weakness or numbness in your arms or legs. You develop nausea or vomiting. You develop abdominal pain. You feel faint. Summary Acute back pain is sudden and usually short-lived. Use proper lifting techniques. When you bend and lift, use positions that put less stress on your  back. Take over-the-counter and prescription medicines and apply heat or ice as directed by your health care provider. This information is not intended to replace advice given to you by your health care provider. Make sure you discuss any questions you have with your healthcare provider. Document Revised: 07/31/2020 Document Reviewed: 08/03/2020 Elsevier Patient Education  2022 [place]. Constipation, Adult Constipation is when a person has fewer than three bowel movements in a week, has difficulty having a bowel movement, or has stools (feces) that are dry, hard, or larger than normal. Constipation may be caused by an underlying condition. It may become worse with age if a person takes certainmedicines and does not take in enough fluids. Follow these instructions at home: Eating and drinking  Eat foods that have a lot of fiber, such as beans, whole grains, and fresh fruits and vegetables. Limit foods that are low in fiber and high in fat and processed sugars, such as fried or sweet foods. These include french fries, hamburgers, cookies, candies, and soda. Drink enough fluid to keep your urine pale yellow.  General instructions Exercise regularly or as told by your health care provider. Try to do 150 minutes of moderate exercise each week. Use the bathroom when you have the urge to go. Do not hold it in. Take over-the-counter and prescription  medicines only as told by your health care provider. This includes any fiber supplements. During bowel movements: Practice deep breathing while relaxing the lower abdomen. Practice pelvic floor relaxation. Watch your condition for any changes. Let your health care provider know about them. Keep all follow-up visits as told by your health care provider. This is important. Contact a health care provider if: You have pain that gets worse. You have a fever. You do not have a bowel movement after 4 days. You vomit. You are not hungry or you lose weight. You are bleeding from the opening between the buttocks (anus). You have thin, pencil-like stools. Get help right away if: You have a fever and your symptoms suddenly get worse. You leak stool or have blood in your stool. Your abdomen is bloated. You have severe pain in your abdomen. You feel dizzy or you faint. Summary Constipation is when a person has fewer than three bowel movements in a week, has difficulty having a bowel movement, or has stools (feces) that are dry, hard, or larger than normal. Eat foods that have a lot of fiber, such as beans, whole grains, and fresh fruits and vegetables. Drink enough fluid to keep your urine pale yellow. Take over-the-counter and prescription medicines only as told by your health care provider. This includes any fiber supplements. This information is not intended to replace advice given to you by your health care provider. Make sure you discuss any questions you have with your healthcare provider. Document Revised: 09/28/2019 Document Reviewed: 09/28/2019 Elsevier Patient Education  [place].

## 2021-08-13 DIAGNOSIS — R140 Abdominal distension (gaseous): Secondary | ICD-10-CM | POA: Diagnosis not present

## 2021-08-13 DIAGNOSIS — E039 Hypothyroidism, unspecified: Secondary | ICD-10-CM | POA: Diagnosis not present

## 2021-08-13 DIAGNOSIS — Z130 Encounter for screening for diseases of the blood and blood-forming organs and certain disorders involving the immune mechanism: Secondary | ICD-10-CM | POA: Diagnosis not present

## 2021-08-13 DIAGNOSIS — Z1389 Encounter for screening for other disorder: Secondary | ICD-10-CM | POA: Diagnosis not present

## 2021-08-13 DIAGNOSIS — Z1231 Encounter for screening mammogram for malignant neoplasm of breast: Secondary | ICD-10-CM | POA: Diagnosis not present

## 2021-08-13 DIAGNOSIS — N83291 Other ovarian cyst, right side: Secondary | ICD-10-CM | POA: Diagnosis not present

## 2021-08-13 DIAGNOSIS — Z01411 Encounter for gynecological examination (general) (routine) with abnormal findings: Secondary | ICD-10-CM | POA: Diagnosis not present

## 2021-08-14 ENCOUNTER — Ambulatory Visit: Payer: Self-pay | Admitting: *Deleted

## 2021-08-14 NOTE — Telephone Encounter (Signed)
Pt states she has had diarrhea, loose stools for 4 days. Also nausea. Pt wanted to be seen today or Friday.

## 2021-08-14 NOTE — Telephone Encounter (Signed)
Day 4 with diarrhea. No fever. No blood in stool/urine.Nausea but no vomiting.Burning in the abdomen at times. No one else sick. Recently sick with a cold. No spoiled foods eaten. Care Advice reviewed per protocol. Call back if no improvement or worsens.    Reason for Disposition  MILD-MODERATE diarrhea (e.g., 1-6 times / day more than normal)  Answer Assessment - Initial Assessment Questions 1. DIARRHEA SEVERITY: "How bad is the diarrhea?" "How many more stools have you had in the past 24 hours than normal?"    - NO DIARRHEA (SCALE 0)   - MILD (SCALE 1-3): Few loose or mushy BMs; increase of 1-3 stools over normal daily number of stools; mild increase in ostomy output.   -  MODERATE (SCALE 4-7): Increase of 4-6 stools daily over normal; moderate increase in ostomy output. * SEVERE (SCALE 8-10; OR 'WORST POSSIBLE'): Increase of 7 or more stools daily over normal; moderate increase in ostomy output; incontinence.     3x today 2. ONSET: "When did the diarrhea begin?"      Sunday 3. BM CONSISTENCY: "How loose or watery is the diarrhea?"      Watery with some substance 4. VOMITING: "Are you also vomiting?" If Yes, ask: "How many times in the past 24 hours?"      none 5. ABDOMINAL PAIN: "Are you having any abdominal pain?" If Yes, ask: "What does it feel like?" (e.g., crampy, dull, intermittent, constant)      burning 6. ABDOMINAL PAIN SEVERITY: If present, ask: "How bad is the pain?"  (e.g., Scale 1-10; mild, moderate, or severe)   - MILD (1-3): doesn't interfere with normal activities, abdomen soft and not tender to touch    - MODERATE (4-7): interferes with normal activities or awakens from sleep, abdomen tender to touch    - SEVERE (8-10): excruciating pain, doubled over, unable to do any normal activities       Mild, at work today 7. ORAL INTAKE: If vomiting, "Have you been able to drink liquids?" "How much liquids have you had in the past 24 hours?"     Drinking and retaining fluids 8.  HYDRATION: "Any signs of dehydration?" (e.g., dry mouth [not just dry lips], too weak to stand, dizziness, new weight loss) "When did you last urinate?"     today 9. EXPOSURE: "Have you traveled to a foreign country recently?" "Have you been exposed to anyone with diarrhea?" "Could you have eaten any food that was spoiled?"     Was in [place] over the weekend 10. ANTIBIOTIC USE: "Are you taking antibiotics now or have you taken antibiotics in the past 2 months?"       no 11. OTHER SYMPTOMS: "Do you have any other symptoms?" (e.g., fever, blood in stool)       none 12. PREGNANCY: "Is there any chance you are pregnant?" "When was your last menstrual period?"       Not pregnant  Protocols used: [facility]

## 2021-08-20 ENCOUNTER — Encounter: Payer: Self-pay | Admitting: Family Medicine

## 2021-08-21 DIAGNOSIS — L308 Other specified dermatitis: Secondary | ICD-10-CM | POA: Diagnosis not present

## 2021-08-21 DIAGNOSIS — D485 Neoplasm of uncertain behavior of skin: Secondary | ICD-10-CM | POA: Diagnosis not present

## 2021-08-21 DIAGNOSIS — R208 Other disturbances of skin sensation: Secondary | ICD-10-CM | POA: Diagnosis not present

## 2021-09-17 ENCOUNTER — Encounter: Payer: Self-pay | Admitting: Family Medicine

## 2021-09-17 ENCOUNTER — Other Ambulatory Visit: Payer: Self-pay

## 2021-09-17 ENCOUNTER — Ambulatory Visit (INDEPENDENT_AMBULATORY_CARE_PROVIDER_SITE_OTHER): Payer: BC Managed Care – PPO | Admitting: Family Medicine

## 2021-09-17 VITALS — BP 107/71 | HR 62 | Temp 97.8°F | Resp 16 | Ht 66.0 in | Wt 147.0 lb

## 2021-09-17 DIAGNOSIS — F419 Anxiety disorder, unspecified: Secondary | ICD-10-CM

## 2021-09-17 DIAGNOSIS — Z23 Encounter for immunization: Secondary | ICD-10-CM

## 2021-09-17 DIAGNOSIS — Z411 Encounter for cosmetic surgery: Secondary | ICD-10-CM | POA: Diagnosis not present

## 2021-09-17 DIAGNOSIS — G43909 Migraine, unspecified, not intractable, without status migrainosus: Secondary | ICD-10-CM

## 2021-09-17 DIAGNOSIS — N62 Hypertrophy of breast: Secondary | ICD-10-CM

## 2021-09-17 DIAGNOSIS — Z0000 Encounter for general adult medical examination without abnormal findings: Secondary | ICD-10-CM | POA: Diagnosis not present

## 2021-09-17 MED ORDER — SUMATRIPTAN SUCCINATE 50 MG PO TABS: 50.0000 mg | ORAL_TABLET | ORAL | 2 refills | Status: DC | PRN

## 2021-09-17 NOTE — Patient Instructions (Signed)
TRY OVER-THE-COUNTER METAMUCIL DAILY. CALL HOLLY HALSTON AT [phone]

## 2021-09-24 DIAGNOSIS — D2272 Melanocytic nevi of left lower limb, including hip: Secondary | ICD-10-CM | POA: Diagnosis not present

## 2021-09-24 DIAGNOSIS — D2271 Melanocytic nevi of right lower limb, including hip: Secondary | ICD-10-CM | POA: Diagnosis not present

## 2021-09-24 DIAGNOSIS — D225 Melanocytic nevi of trunk: Secondary | ICD-10-CM | POA: Diagnosis not present

## 2021-09-24 DIAGNOSIS — D2262 Melanocytic nevi of left upper limb, including shoulder: Secondary | ICD-10-CM | POA: Diagnosis not present

## 2021-09-24 DIAGNOSIS — D2261 Melanocytic nevi of right upper limb, including shoulder: Secondary | ICD-10-CM | POA: Diagnosis not present

## 2021-09-24 DIAGNOSIS — R208 Other disturbances of skin sensation: Secondary | ICD-10-CM | POA: Diagnosis not present

## 2021-09-24 DIAGNOSIS — D485 Neoplasm of uncertain behavior of skin: Secondary | ICD-10-CM | POA: Diagnosis not present

## 2021-09-30 NOTE — Progress Notes (Signed)
Complete physical exam   Patient: Brandi [name]   DOB: 05/16/1981   40 y.o. Female  MRN: [id number] Visit Date: 09/17/2021  Today's healthcare provider: Wilhemena Durie, MD   Chief Complaint  Patient presents with   Annual Exam   Subjective    Brandi [name] is a 40 y.o. female who presents today for a complete physical exam.  She reports consuming a general diet. Home exercise routine includes gym. She generally feels well. She reports sleeping well. She does not have additional problems to discuss today.  She is married and her children are 43 and 6. HPI  Delightful lady comes in today for follow-up.  She is doing well with her anxiety. She sees Land from dermatology. She wonders if he she could have ADD and would not mind an evaluation of this. She tested negative for BRCA in the past year as her mother had breast cancer and her father had colon cancer. The last issue is 1 of chronic ongoing thoracic back pain, I think it is directly attributable both to her large breasts and is slowly getting worse.  She also has dense in the area of her bra strap on her shoulders.  She is interested in breast reduction surgery.  Past Medical History:  Diagnosis Date   Bronchitis    for last month.  started antibiotic 04/19/17   Family history of breast cancer    Family history of breast cancer    Family history of colon cancer    Family history of kidney cancer    Family history of ovarian cancer    Family history of uterine cancer    Hypothyroidism    Migraine headache    "seems r/t menstrual cycle"   Motion sickness    all vehicles   Past Surgical History:  Procedure Laterality Date   CESAREAN SECTION  04/2010   CESAREAN SECTION N/A 01/10/2015   Procedure: CESAREAN SECTION;  Surgeon: Melina Schools, MD;  Location: [facility];  Service: Obstetrics;  Laterality: N/A;   COLONOSCOPY     COLONOSCOPY WITH PROPOFOL N/A 04/30/2017   Procedure: COLONOSCOPY WITH PROPOFOL;  Surgeon:  Lucilla Lame, MD;  Location: [facility];  Service: Endoscopy;  Laterality: N/A;   laprascopic     exploratory   OTHER SURGICAL HISTORY  2015   IVF egg retrieval   Social History   Socioeconomic History   Marital status: Married    Spouse name: Not on file   Number of children: Not on file   Years of education: Not on file   Highest education level: Not on file  Occupational History   Not on file  Tobacco Use   Smoking status: Never   Smokeless tobacco: Never  Substance and Sexual Activity   Alcohol use: Yes    Alcohol/week: 4.0 standard drinks    Types: 4 Glasses of wine per week    Comment: rarely   Drug use: No   Sexual activity: Yes  Other Topics Concern   Not on file  Social History Narrative   Not on file   Social Determinants of Health   Financial Resource Strain: Not on file  Food Insecurity: Not on file  Transportation Needs: Not on file  Physical Activity: Not on file  Stress: Not on file  Social Connections: Not on file  Intimate Partner Violence: Not on file   Family Status  Relation Name Status   Mother  Alive   Father  Alive  Brother  Risk analyst  Deceased   MGM  Alive   MGF  Deceased   PGM  Deceased   PGF  Deceased   Brother  Alive   Pat 70  Alive   Son  Alive   Daughter  Alive   Neg Hx  (Not Specified)   Family History  Problem Relation Age of Onset   Migraines Mother    Hyperlipidemia Mother    Anxiety disorder Mother 57   Breast cancer Mother 74   Hyperlipidemia Father    Colon cancer Father    Melanoma Father    Kidney cancer Paternal Uncle    Dementia Maternal Grandmother    Breast cancer Maternal Grandmother 73   Endometrial cancer Maternal Grandmother        dx 49s   Stroke Maternal Grandfather    Aneurysm Paternal Grandfather    Alcohol abuse Neg Hx    Arthritis Neg Hx    Asthma Neg Hx    Birth defects Neg Hx    COPD Neg Hx    Depression Neg Hx    Diabetes Neg Hx    Drug abuse Neg Hx    Early  death Neg Hx    Hearing loss Neg Hx    Heart disease Neg Hx    Hypertension Neg Hx    Kidney disease Neg Hx    Learning disabilities Neg Hx    Mental illness Neg Hx    Mental retardation Neg Hx    Miscarriages / Stillbirths Neg Hx    Vision loss Neg Hx    Varicose Veins Neg Hx    Allergies  Allergen Reactions   Adhesive [Tape] Rash    Some bandaids   Neosporin [Neomycin-Bacitracin Zn-Polymyx] Rash   Sulfa Antibiotics Rash    Patient Care Team: Jerrol Banana., MD as PCP - General (Family Medicine)   Medications: Outpatient Medications Prior to Visit  Medication Sig   levothyroxine (SYNTHROID) 50 MCG tablet TAKE 1 TABLET BY MOUTH DAILY BEFORE BREAKFAST   loratadine (CLARITIN) 10 MG tablet Take 1 tablet (10 mg total) by mouth daily.   sertraline (ZOLOFT) 50 MG tablet TAKE 1 AND 1/2 TABLETS DAILY BY MOUTH   SPIRONOLACTONE PO Take by mouth.   [DISCONTINUED] SUMAtriptan (IMITREX) 50 MG tablet Take 50 mg by mouth every 2 (two) hours as needed for migraine. May repeat in 2 hours if headache persists or recurs. (Patient not taking: No sig reported)   No facility-administered medications prior to visit.    Review of Systems  All other systems reviewed and are negative.     Objective    BP 107/71 (BP Location: Left Arm, Patient Position: Sitting, Cuff Size: Normal)   Pulse 62   Temp 97.8 F (36.6 C) (Temporal)   Resp 16   Ht 5' 6"  (1.676 m)   Wt 147 lb (66.7 kg)   SpO2 98%   BMI 23.73 kg/m  BP Readings from Last 3 Encounters:  09/17/21 107/71  05/31/21 110/79  09/05/20 112/77   Wt Readings from Last 3 Encounters:  09/17/21 147 lb (66.7 kg)  05/31/21 149 lb (67.6 kg)  09/05/20 140 lb 9.6 oz (63.8 kg)      Physical Exam Vitals reviewed.  Constitutional:      Appearance: She is well-developed.  HENT:     Head: Normocephalic and atraumatic.     Right Ear: External ear normal.     Left Ear: External ear normal.  Nose: Nose normal.  Eyes:     General:  No scleral icterus.    Conjunctiva/sclera: Conjunctivae normal.     Pupils: Pupils are equal, round, and reactive to light.  Neck:     Thyroid: No thyromegaly.  Cardiovascular:     Rate and Rhythm: Normal rate and regular rhythm.     Heart sounds: Normal heart sounds.  Pulmonary:     Effort: Pulmonary effort is normal.     Breath sounds: Normal breath sounds.  Abdominal:     Palpations: Abdomen is soft.  Lymphadenopathy:     Cervical: No cervical adenopathy.  Skin:    General: Skin is warm and dry.     Comments:  Multiple Small nevi of skin   Neurological:     General: No focal deficit present.     Mental Status: She is alert and oriented to person, place, and time.  Psychiatric:        Mood and Affect: Mood normal.        Behavior: Behavior normal.        Thought Content: Thought content normal.        Judgment: Judgment normal.      Last depression screening scores PHQ 2/9 Scores 09/17/2021 09/05/2020 08/18/2019  PHQ - 2 Score 0 0 0  PHQ- 9 Score 1 - -   Last fall risk screening Fall Risk  09/17/2021  Falls in the past year? 0  Number falls in past yr: 0  Injury with Fall? 0  Risk for fall due to : No Fall Risks  Follow up Falls evaluation completed   Last Audit-C alcohol use screening Alcohol Use Disorder Test (AUDIT) 09/17/2021  1. How often do you have a drink containing alcohol? 3  2. How many drinks containing alcohol do you have on a typical day when you are drinking? 0  3. How often do you have six or more drinks on one occasion? 0  AUDIT-C Score 3  4. How often during the last year have you found that you were not able to stop drinking once you had started? 0  5. How often during the last year have you failed to do what was normally expected from you because of drinking? 0  6. How often during the last year have you needed a first drink in the morning to get yourself going after a heavy drinking session? 0  7. How often during the last year have you had a  feeling of guilt of remorse after drinking? 0  8. How often during the last year have you been unable to remember what happened the night before because you had been drinking? 0  9. Have you or someone else been injured as a result of your drinking? 0  10. Has a relative or friend or a doctor or another health worker been concerned about your drinking or suggested you cut down? 0  Alcohol Use Disorder Identification Test Final Score (AUDIT) 3  Alcohol Brief Interventions/Follow-up -   A score of 3 or more in women, and 4 or more in men indicates increased risk for alcohol abuse, EXCEPT if all of the points are from question 1   No results found for any visits on 09/17/21.  Assessment & Plan    Routine Health Maintenance and Physical Exam  Exercise Activities and Dietary recommendations  Goals   None     Immunization History  Administered Date(s) Administered   Influenza, Seasonal, Injecte, Preservative Fre 09/18/2014  Influenza,inj,Quad PF,6+ Mos 09/27/2015, 09/11/2016, 09/15/2018, 09/05/2020, 09/17/2021   PFIZER(Purple Top)SARS-COV-2 Vaccination 02/23/2020, 03/21/2020   Tdap 10/30/2014    Health Maintenance  Topic Date Due   Hepatitis C Screening  Never done   PAP SMEAR-Modifier  02/21/2018   COVID-19 Vaccine (3 - Booster for Pfizer series) 10/12/2021 (Originally 05/16/2020)   Pneumococcal Vaccine 68-48 Years old (1 - PCV) 09/26/2022 (Originally 08/08/1986)   COLONOSCOPY (Pts 45-79yr Insurance coverage will need to be confirmed)  04/30/2022   TETANUS/TDAP  10/30/2024   INFLUENZA VACCINE  Completed   HIV Screening  Completed   HPV VACCINES  Aged Out    Discussed health benefits of physical activity, and encouraged her to engage in regular exercise appropriate for her age and condition.  1. Annual physical exam  - CBC with Differential/Platelet - Comprehensive metabolic panel - Lipid Panel With LDL/HDL Ratio - TSH  2. Need for influenza vaccination  - Flu Vaccine  QUAD 6+ mos PF IM (Fluarix Quad PF)  3. Encounter for breast reductio Refer to Dr. CNathanial Rancherfrom plastic surgery - Ambulatory referral to Plastic Surgery  4. Migraine without status migrainosus, not intractable, unspecified migraine type  - SUMAtriptan (IMITREX) 50 MG tablet; Take 1 tablet (50 mg total) by mouth every 2 (two) hours as needed for migraine. May repeat in 2 hours if headache persists or recurs.  Dispense: 10 tablet; Refill: 2  5. Large breasts Chronic back pain.  Refer to plastic surgery.  [facility]. [facility]   Return in about 1 year (around 09/17/2022).     I, RWilhemena Durie MD, have reviewed all documentation for this visit. The documentation on 10/01/21 for the exam, diagnosis, procedures, and orders are all accurate and complete.    Yumiko Alkins GCranford Mon MD  [phone](phone) [phone](fax)  [place]

## 2021-10-08 ENCOUNTER — Other Ambulatory Visit: Payer: Self-pay | Admitting: Family Medicine

## 2021-10-08 DIAGNOSIS — F419 Anxiety disorder, unspecified: Secondary | ICD-10-CM

## 2021-10-08 NOTE — Telephone Encounter (Signed)
Requested Prescriptions  Pending Prescriptions Disp Refills  . sertraline (ZOLOFT) 50 MG tablet [Pharmacy Med Name: SERTRALINE HCL 50 MG TABLET] 135 tablet 1    Sig: TAKE 1 AND 1/2 TABLETS BY MOUTH DAILY     Psychiatry:  Antidepressants - SSRI Passed - 10/08/2021  5:15 PM      Passed - Valid encounter within last 6 months    Recent Outpatient Visits          3 weeks ago Annual physical exam   [facility] Jerrol Banana., MD   4 months ago Constipation, unspecified constipation type   [place], Vickki Muff, [place]   1 year ago Acquired hypothyroidism   [facility] Jerrol Banana., MD   1 year ago Constipation, unspecified constipation type   [facility] Jerrol Banana., MD   2 years ago Annual physical exam   [facility] Jerrol Banana., MD      Future Appointments            In 11 months Jerrol Banana., MD [facility]           . loratadine (CLARITIN) 10 MG tablet [Pharmacy Med Name: LORATADINE 10 MG TABLET] 90 tablet 3    Sig: TAKE 1 TABLET BY MOUTH EVERY DAY     Ear, Nose, and Throat:  Antihistamines Passed - 10/08/2021  5:15 PM      Passed - Valid encounter within last 12 months    Recent Outpatient Visits          3 weeks ago Annual physical exam   [facility] Jerrol Banana., MD   4 months ago Constipation, unspecified constipation type   [place], Vickki Muff, [place]   1 year ago Acquired hypothyroidism   [facility] Jerrol Banana., MD   1 year ago Constipation, unspecified constipation type   [facility]   2 years ago Annual physical exam   [facility] Jerrol Banana., MD      Future Appointments            In 11 months Jerrol Banana., MD [facility], [place]

## 2021-11-11 ENCOUNTER — Ambulatory Visit: Payer: Self-pay | Admitting: *Deleted

## 2021-11-11 NOTE — Telephone Encounter (Signed)
Per agent: "Pt is calling because she tested positive for COVID yesterday. Her sx include - head and chest congestion, body aches, no fever today, cough."   Chief Complaint:Covid positive yesterday Symptoms: Cough, body aches, fatigue, sneezing Frequency: Symptoms onset Friday Pertinent Negatives: Patient denies SOB, no wheezing, no CP, afebrile today Disposition: []ED /[]Urgent Care (no appt availability in office) / []Appointment(In office/virtual)/ [] [place] [facility] [facility]/ [x]Home Care/ []Refused Recommended Disposition  Additional Notes: Home care advise provided as well as guidelines for self isolation. Reviewed S/S that warrant an ED eval. Pt verbalizes understanding.   Would be interested in oral anti viral only if PCP agrees appropriate "Not really interested."   Reason for Disposition  [1] COVID-19 diagnosed by positive lab test (e.g., PCR, rapid self-test kit) AND [2] mild symptoms (e.g., cough, fever, others) AND [2] no complications or SOB  Answer Assessment - Initial Assessment Questions 1. COVID-19 DIAGNOSIS: "Who made your COVID-19 diagnosis?" "Was it confirmed by a positive lab test or self-test?" If not diagnosed by a doctor (or NP/PA), ask "Are there lots of cases (community spread) where you live?" Note: See public health department website, if unsure.     Home test 2. COVID-19 EXPOSURE: "Was there any known exposure to COVID before the symptoms began?" CDC Definition of close contact: within 6 feet (2 meters) for a total of 15 minutes or more over a 24-hour period.      No 3. ONSET: "When did the COVID-19 symptoms start?"      Mild cold Friday, worsening _0 . VACCINE: "Have you had the COVID-19 vaccine?" If Yes, ask: "Which one, how many shots, when did you get it?"       2 vaccines.  2021 pfizer  11. BOOSTER: "Have you received your COVID-19 booster?" If Yes, ask: "Which one and when did you get it?"        no 13. OTHER SYMPTOMS: "Do you have any other symptoms?"  (e.g., chills, fatigue, headache, loss of smell or taste, muscle pain, sore throat)       Sneezing, runny nose, body aches, fatigued 14. O2 SATURATION MONITOR:  "Do you use an oxygen saturation monitor (pulse oximeter) at home?" If Yes, ask "What is your reading (oxygen level) today?" "What is your usual oxygen saturation reading?" (e.g., 95%) NA  Protocols used: Coronavirus (COVID-19) Diagnosed or Suspected-A-AH

## 2021-12-09 ENCOUNTER — Other Ambulatory Visit: Payer: Self-pay | Admitting: Family Medicine

## 2021-12-09 NOTE — Telephone Encounter (Signed)
Requested medication (s) are due for refill today   Yes  Requested medication (s) are on the active medication list Yes  Future visit scheduled Yes for 09/18/22  Note to clinic-Most recent TSH in Epic is 08/18/19.  Was ordered on 05/31/21 and 09/17/21. Routing to physician for review.    Requested Prescriptions  Pending Prescriptions Disp Refills   levothyroxine (SYNTHROID) 50 MCG tablet [Pharmacy Med Name: LEVOTHYROXINE 50 MCG TABLET] 90 tablet 3    Sig: TAKE 1 TABLET BY MOUTH EVERY DAY BEFORE BREAKFAST     Endocrinology:  Hypothyroid Agents Failed - 12/09/2021  1:55 PM      Failed - TSH needs to be rechecked within 3 months after an abnormal result. Refill until TSH is due.      Failed - TSH in normal range and within 360 days    TSH  Date Value Ref Range Status  08/18/2019 1.720 0.450 - 4.500 uIU/mL Final          Passed - Valid encounter within last 12 months    Recent Outpatient Visits           2 months ago Annual physical exam   [facility] Jerrol Banana., MD   6 months ago Constipation, unspecified constipation type   [address], Vickki Muff, [place]   1 year ago Acquired hypothyroidism   [facility] Jerrol Banana., MD   1 year ago Constipation, unspecified constipation type   [facility] Jerrol Banana., MD   2 years ago Annual physical exam   [facility] Jerrol Banana., MD       Future Appointments             In 9 months Jerrol Banana., MD [facility], [place]

## 2022-01-21 DIAGNOSIS — Z3201 Encounter for pregnancy test, result positive: Secondary | ICD-10-CM | POA: Diagnosis not present

## 2022-01-23 DIAGNOSIS — O209 Hemorrhage in early pregnancy, unspecified: Secondary | ICD-10-CM | POA: Diagnosis not present

## 2022-01-28 DIAGNOSIS — Z3201 Encounter for pregnancy test, result positive: Secondary | ICD-10-CM | POA: Diagnosis not present

## 2022-01-28 DIAGNOSIS — Z3A08 8 weeks gestation of pregnancy: Secondary | ICD-10-CM | POA: Diagnosis not present

## 2022-01-28 DIAGNOSIS — O200 Threatened abortion: Secondary | ICD-10-CM | POA: Diagnosis not present

## 2022-01-28 DIAGNOSIS — O209 Hemorrhage in early pregnancy, unspecified: Secondary | ICD-10-CM | POA: Diagnosis not present

## 2022-02-11 DIAGNOSIS — Z3A08 8 weeks gestation of pregnancy: Secondary | ICD-10-CM | POA: Diagnosis not present

## 2022-02-11 DIAGNOSIS — O3680X0 Pregnancy with inconclusive fetal viability, not applicable or unspecified: Secondary | ICD-10-CM | POA: Diagnosis not present

## 2022-02-12 DIAGNOSIS — Z3A08 8 weeks gestation of pregnancy: Secondary | ICD-10-CM | POA: Diagnosis not present

## 2022-02-12 DIAGNOSIS — O200 Threatened abortion: Secondary | ICD-10-CM | POA: Diagnosis not present

## 2022-02-14 ENCOUNTER — Encounter (HOSPITAL_COMMUNITY): Payer: Self-pay

## 2022-02-14 ENCOUNTER — Observation Stay (HOSPITAL_COMMUNITY)
Admission: AD | Admit: 2022-02-14 | Discharge: 2022-02-15 | Disposition: A | Discharge status: Home / Self Care | Payer: BC Managed Care – PPO | Attending: Obstetrics and Gynecology | Admitting: Obstetrics and Gynecology

## 2022-02-14 ENCOUNTER — Inpatient Hospital Stay (HOSPITAL_COMMUNITY): Payer: BC Managed Care – PPO

## 2022-02-14 ENCOUNTER — Other Ambulatory Visit: Payer: Self-pay

## 2022-02-14 DIAGNOSIS — Z3A08 8 weeks gestation of pregnancy: Secondary | ICD-10-CM | POA: Insufficient documentation

## 2022-02-14 DIAGNOSIS — O4691 Antepartum hemorrhage, unspecified, first trimester: Principal | ICD-10-CM | POA: Insufficient documentation

## 2022-02-14 DIAGNOSIS — N939 Abnormal uterine and vaginal bleeding, unspecified: Secondary | ICD-10-CM | POA: Diagnosis not present

## 2022-02-14 DIAGNOSIS — O26851 Spotting complicating pregnancy, first trimester: Secondary | ICD-10-CM | POA: Diagnosis not present

## 2022-02-14 DIAGNOSIS — O09521 Supervision of elderly multigravida, first trimester: Secondary | ICD-10-CM | POA: Diagnosis not present

## 2022-02-14 DIAGNOSIS — O209 Hemorrhage in early pregnancy, unspecified: Secondary | ICD-10-CM | POA: Diagnosis not present

## 2022-02-14 LAB — COMPREHENSIVE METABOLIC PANEL
ALT: 15 U/L (ref 0–44)
AST: 18 U/L (ref 15–41)
Albumin: 4 g/dL (ref 3.5–5.0)
Alkaline Phosphatase: 38 U/L (ref 38–126)
Anion gap: 8 (ref 5–15)
BUN: <5 mg/dL — ABNORMAL LOW (ref 6–20)
CO2: 23 mmol/L (ref 22–32)
Calcium: 9.2 mg/dL (ref 8.9–10.3)
Chloride: 103 mmol/L (ref 98–111)
Creatinine, Ser: 0.55 mg/dL (ref 0.44–1.00)
GFR, Estimated: >60 mL/min (ref >60)
Glucose, Bld: 99 mg/dL (ref 70–99)
Potassium: 3.3 mmol/L — ABNORMAL LOW (ref 3.5–5.1)
Sodium: 134 mmol/L — ABNORMAL LOW (ref 135–145)
Total Bilirubin: 0.6 mg/dL (ref 0.3–1.2)
Total Protein: 7.1 g/dL (ref 6.5–8.1)

## 2022-02-14 LAB — CBC
HCT: 37.8 % (ref 36.0–46.0)
Hemoglobin: 13.1 g/dL (ref 12.0–15.0)
MCH: 30.8 pg (ref 26.0–34.0)
MCHC: 34.7 g/dL (ref 30.0–36.0)
MCV: 88.7 fL (ref 80.0–100.0)
Platelets: 317 10*3/uL (ref 150–400)
RBC: 4.26 MIL/uL (ref 3.87–5.11)
RDW: 11.9 % (ref 11.5–15.5)
WBC: 6 10*3/uL (ref 4.0–10.5)
nRBC: 0 % (ref 0.0–0.2)

## 2022-02-14 LAB — URINALYSIS, ROUTINE W REFLEX MICROSCOPIC
Bacteria, UA: NONE SEEN
Bilirubin Urine: NEGATIVE
Glucose, UA: NEGATIVE mg/dL
Ketones, ur: NEGATIVE mg/dL
Leukocytes,Ua: NEGATIVE
Nitrite: NEGATIVE
Protein, ur: NEGATIVE mg/dL
Specific Gravity, Urine: 1.002 — ABNORMAL LOW (ref 1.005–1.030)
pH: 7 (ref 5.0–8.0)

## 2022-02-14 LAB — PREPARE RBC (CROSSMATCH)

## 2022-02-14 LAB — HCG, QUANTITATIVE, PREGNANCY: hCG, Beta Chain, Quant, S: 193717 m[IU]/mL — ABNORMAL HIGH (ref <5)

## 2022-02-14 LAB — HEMOGLOBIN AND HEMATOCRIT, BLOOD
HCT: 33.6 % — ABNORMAL LOW (ref 36.0–46.0)
Hemoglobin: 11.5 g/dL — ABNORMAL LOW (ref 12.0–15.0)

## 2022-02-14 MED ORDER — SERTRALINE HCL 50 MG PO TABS: 75.0000 mg | ORAL_TABLET | Freq: Every day | ORAL | Status: DC

## 2022-02-14 MED ORDER — LEVOTHYROXINE SODIUM 25 MCG PO TABS
50.0000 ug | ORAL_TABLET | Freq: Every day | ORAL | Status: DC
  Administered 2022-02-15: 50 ug via ORAL
  Filled 2022-02-14: qty 2

## 2022-02-14 MED ORDER — ACETAMINOPHEN 325 MG PO TABS: 650.0000 mg | ORAL_TABLET | ORAL | Status: DC | PRN

## 2022-02-14 MED ORDER — CALCIUM CARBONATE ANTACID 500 MG PO CHEW: 2.0000 | CHEWABLE_TABLET | ORAL | Status: DC | PRN

## 2022-02-14 MED ORDER — ZOLPIDEM TARTRATE 5 MG PO TABS: 5.0000 mg | ORAL_TABLET | Freq: Every evening | ORAL | Status: DC | PRN

## 2022-02-14 MED ORDER — LACTATED RINGERS IV SOLN
Freq: Once | INTRAVENOUS | Status: AC
Start: 2022-02-14 — End: 2022-02-14

## 2022-02-14 MED ORDER — PRENATAL MULTIVITAMIN CH: 1.0000 | ORAL_TABLET | Freq: Every day | ORAL | Status: DC

## 2022-02-14 MED ORDER — LACTATED RINGERS IV SOLN: INTRAVENOUS | Status: DC

## 2022-02-14 MED ORDER — SERTRALINE HCL 50 MG PO TABS
75.0000 mg | ORAL_TABLET | Freq: Every day | ORAL | Status: DC
Start: 2022-02-15 — End: 2022-02-15

## 2022-02-14 MED ORDER — LACTATED RINGERS IV BOLUS
1000.0000 mL | Freq: Once | INTRAVENOUS | Status: AC
  Administered 2022-02-14: 1000 mL via INTRAVENOUS

## 2022-02-14 MED ORDER — DOCUSATE SODIUM 100 MG PO CAPS: 100.0000 mg | ORAL_CAPSULE | Freq: Every day | ORAL | Status: DC

## 2022-02-14 NOTE — MAU Note (Signed)
Brandi [name] is a 41 y.o. at 37w2dhere in MAU reporting: has had bleeding for the past couple of days and it has gotten heavier. States today it has been pouring out of her and has seen a large clot, about 3-4 inches. Is having some lower abdominal pain. Had an u/s on Tuesday and Wednesday and they saw live IUP both days, no subchorionic bleed. Is feeling dizzy. ? ?Onset of complaint: ongoing ? ?Pain score: 0/10 ? ?Vitals:  ? 02/14/22 0958  ?BP: 120/65  ?Pulse: 87  ?Resp: 18  ?Temp: 98.3 ?F (36.8 ?C)  ?SpO2: 100%  ?   ?Lab orders placed from triage: UA ? ?

## 2022-02-14 NOTE — MAU Provider Note (Addendum)
?History  ?  ? ?CSN: [id number] ? ?Arrival date and time: 02/14/22 0943 ? ? Event Date/Time  ? First Provider Initiated Contact with Patient 02/14/22 1003   ?  ? ?Chief Complaint  ?Patient presents with  ? Abdominal Pain  ? Vaginal Bleeding  ? ?HPI ?Brandi [name] is a 41 y.o. G3P2001 at 58w2dwho presents to MAU with chief complaint of heavy vaginal bleeding. Patient reports normal ultrasound in office Tuesday. She noted new onset bleeding with passing of clots Wednesday and Thursday. This morning her bleeding significantly intensified, becoming continuous and accompanied by weakness and dizziness. ? ?[facility] ? ?OB History   ? ? Gravida  ?3  ? Para  ?2  ? Term  ?2  ? Preterm  ?   ? AB  ?   ? Living  ?1  ?  ? ? SAB  ?   ? IAB  ?   ? Ectopic  ?   ? Multiple  ?0  ? Live Births  ?1  ?   ?  ?  ? ? ?Past Medical History:  ?Diagnosis Date  ? Bronchitis   ? for last month.  started antibiotic 04/19/17  ? Family history of breast cancer   ? Family history of breast cancer   ? Family history of colon cancer   ? Family history of kidney cancer   ? Family history of ovarian cancer   ? Family history of uterine cancer   ? Hypothyroidism   ? Migraine headache   ? "seems r/t menstrual cycle"  ? Motion sickness   ? all vehicles  ? ? ?Past Surgical History:  ?Procedure Laterality Date  ? CESAREAN SECTION  04/2010  ? CESAREAN SECTION N/A 01/10/2015  ? Procedure: CESAREAN SECTION;  Surgeon: TMelina Schools MD;  [facility];  Service: Obstetrics;  Laterality: N/A;  ? COLONOSCOPY    ? COLONOSCOPY WITH PROPOFOL N/A 04/30/2017  ? Procedure: COLONOSCOPY WITH PROPOFOL;  Surgeon: WLucilla Lame MD;  [facility]  Service: Endoscopy;  Laterality: N/A;  ? laprascopic    ? exploratory  ? OTHER SURGICAL HISTORY  2015  ? IVF egg retrieval  ? ? ?Family History  ?Problem Relation Age of Onset  ? Migraines Mother   ? Hyperlipidemia Mother   ? Anxiety disorder Mother 579 ? Breast cancer Mother 558 ?  Hyperlipidemia Father   ? Colon cancer Father   ? Melanoma Father   ? Kidney cancer Paternal Uncle   ? Dementia Maternal Grandmother   ? Breast cancer Maternal Grandmother 572 ? Endometrial cancer Maternal Grandmother   ?     dx 716s ? Stroke Maternal Grandfather   ? Aneurysm Paternal Grandfather   ? Alcohol abuse Neg Hx   ? Arthritis Neg Hx   ? Asthma Neg Hx   ? Birth defects Neg Hx   ? COPD Neg Hx   ? Depression Neg Hx   ? Diabetes Neg Hx   ? Drug abuse Neg Hx   ? Early death Neg Hx   ? Hearing loss Neg Hx   ? Heart disease Neg Hx   ? Hypertension Neg Hx   ? Kidney disease Neg Hx   ? Learning disabilities Neg Hx   ? Mental illness Neg Hx   ? Mental retardation Neg Hx   ? Miscarriages / Stillbirths Neg Hx   ? Vision loss Neg Hx   ? Varicose Veins Neg  Hx   ? ? ?Social History  ? ?Tobacco Use  ? Smoking status: Never  ? Smokeless tobacco: Never  ?Substance Use Topics  ? Alcohol use: Yes  ?  Alcohol/week: 4.0 standard drinks  ?  Types: 4 Glasses of wine per week  ?  Comment: rarely  ? Drug use: No  ? ? ?Allergies:  ?Allergies  ?Allergen Reactions  ? Adhesive [Tape] Rash  ?  Some bandaids  ? Neosporin [Neomycin-Bacitracin Zn-Polymyx] Rash  ? Sulfa Antibiotics Rash  ? ? ?Medications Prior to Admission  ?Medication Sig Dispense Refill Last Dose  ? levothyroxine (SYNTHROID) 50 MCG tablet TAKE 1 TABLET BY MOUTH EVERY DAY BEFORE BREAKFAST 90 tablet 0   ? loratadine (CLARITIN) 10 MG tablet TAKE 1 TABLET BY MOUTH EVERY DAY 90 tablet 3   ? sertraline (ZOLOFT) 50 MG tablet TAKE 1 AND 1/2 TABLETS BY MOUTH DAILY 135 tablet 1   ? SPIRONOLACTONE PO Take by mouth.     ? SUMAtriptan (IMITREX) 50 MG tablet Take 1 tablet (50 mg total) by mouth every 2 (two) hours as needed for migraine. May repeat in 2 hours if headache persists or recurs. 10 tablet 2   ? ? ?Review of Systems  ?Genitourinary:  Positive for vaginal bleeding.  ?All other systems reviewed and are negative. ?Physical Exam  ? ?Blood pressure 120/65, pulse 87, temperature  98.3 ?F (36.8 ?C), temperature source Oral, resp. rate 18, SpO2 100 %. ? ?Physical Exam ?Vitals and nursing note reviewed. Exam conducted with a chaperone present.  ?Constitutional:   ?   Appearance: She is well-developed.  ?Cardiovascular:  ?   Rate and Rhythm: Normal rate.  ?Pulmonary:  ?   Effort: Pulmonary effort is normal.  ?Abdominal:  ?   Palpations: Abdomen is soft.  ?Genitourinary: ?   Comments: Streak of blood on perineal and running down legs. Multiple large clots dislodged with insertion of speculum. 8-9 fox swabs used for bleeding in vault. Moderate active bleeding after use of fox swabs. Dr. Terri Piedra requested at bedside. Dr. Roselie Awkward notified of patient status ?Neurological:  ?   Mental Status: She is alert.  ? ? ?MAU Course  ?Procedures ? ?MDM ? ?1120: CNM returned to bedside. Patient alert and awake. VSS. Passed one large clot into toilet when voiding a moment ago. Reports recurrence of mild dizziness. Bleeding stable. Pain score 0/10. ? ?Orders Placed This Encounter  ?Procedures  ? US OB LESS THAN 14 WEEKS WITH OB TRANSVAGINAL  ? Urinalysis, Routine w reflex microscopic Urine, Clean Catch  ? CBC  ? hCG, quantitative, pregnancy  ? Comprehensive metabolic panel  ? Hemoglobin and hematocrit, blood  ? Type and screen [place]  ? Prepare RBC (crossmatch)  ? ?Patient Vitals for the past 24 hrs: ? BP Temp Temp src Pulse Resp SpO2  ?02/14/22 1215 101/60 -- -- 65 -- 97 %  ?02/14/22 1200 103/63 -- -- 70 -- 99 %  ?02/14/22 1145 107/66 -- -- 69 -- 100 %  ?02/14/22 1130 111/76 -- -- 71 -- 100 %  ?02/14/22 1115 117/64 -- -- 72 -- 100 %  ?02/14/22 1101 116/65 -- -- 72 -- 99 %  ?02/14/22 1046 120/65 -- -- 73 -- 100 %  ?02/14/22 1031 118/62 -- -- 74 -- 99 %  ?02/14/22 1018 114/62 -- -- 74 -- --  ?02/14/22 0958 120/65 98.3 ?F (36.8 ?C) Oral 87 18 100 %  ? ?Results for orders placed or performed during the hospital encounter  of 02/14/22 (from the past 24 hour(s))  ?CBC     Status: None  ? Collection  Time: 02/14/22 10:10 AM  ?Result Value Ref Range  ? WBC 6.0 4.0 - 10.5 K/uL  ? RBC 4.26 3.87 - 5.11 MIL/uL  ? Hemoglobin 13.1 12.0 - 15.0 g/dL  ? HCT 37.8 36.0 - 46.0 %  ? MCV 88.7 80.0 - 100.0 fL  ? MCH 30.8 26.0 - 34.0 pg  ? MCHC 34.7 30.0 - 36.0 g/dL  ? RDW 11.9 11.5 - 15.5 %  ? Platelets 317 150 - 400 K/uL  ? nRBC 0.0 0.0 - 0.2 %  ?Type and screen [place]     Status: None (Preliminary result)  ? Collection Time: 02/14/22 10:10 AM  ?Result Value Ref Range  ? ABO/RH(D) A POS   ? Antibody Screen NEG   ? Sample Expiration    ?  02/17/2022,2359 ?Performed at [facility], [place] [address], [place], [place] [address] ?  ? Unit Number [id number]   ? Blood Component Type RED CELLS,LR   ? Unit division 00   ? Status of Unit ALLOCATED   ? Transfusion Status OK TO TRANSFUSE   ? Crossmatch Result Compatible   ? Unit Number [id number]   ? Blood Component Type RED CELLS,LR   ? Unit division 00   ? Status of Unit ALLOCATED   ? Transfusion Status OK TO TRANSFUSE   ? Crossmatch Result Compatible   ?hCG, quantitative, pregnancy     Status: Abnormal  ? Collection Time: 02/14/22 10:10 AM  ?Result Value Ref Range  ? hCG, Beta Chain, Quant, S 193,717 (H) <5 mIU/mL  ?Comprehensive metabolic panel     Status: Abnormal  ? Collection Time: 02/14/22 10:10 AM  ?Result Value Ref Range  ? Sodium 134 (L) 135 - 145 mmol/L  ? Potassium 3.3 (L) 3.5 - 5.1 mmol/L  ? Chloride 103 98 - 111 mmol/L  ? CO2 23 22 - 32 mmol/L  ? Glucose, Bld 99 70 - 99 mg/dL  ? BUN <5 (L) 6 - 20 mg/dL  ? Creatinine, Ser 0.55 0.44 - 1.00 mg/dL  ? Calcium 9.2 8.9 - 10.3 mg/dL  ? Total Protein 7.1 6.5 - 8.1 g/dL  ? Albumin 4.0 3.5 - 5.0 g/dL  ? AST 18 15 - 41 U/L  ? ALT 15 0 - 44 U/L  ? Alkaline Phosphatase 38 38 - 126 U/L  ? Total Bilirubin 0.6 0.3 - 1.2 mg/dL  ? GFR, Estimated >60 >60 mL/min  ? Anion gap 8 5 - 15  ?Prepare RBC (crossmatch)     Status: None  ? Collection Time: 02/14/22 10:15 AM  ?Result Value Ref Range  ? Order Confirmation     ?  ORDER PROCESSED BY BLOOD BANK ?Performed at [facility], [place] [address], [place], [place] [address] ?  ?Hemoglobin and hematocrit, blood     Status: Abnormal  ? Collection Time: 02/14/22 11:2

## 2022-02-14 NOTE — MAU Note (Signed)
Notified Katie Speake in Ultrasound of request to have STAT bedside U/S performed. Ultrasonographer on way to bedside. Dr. Cy Blamer, MD, placing U/S orders. ?

## 2022-02-14 NOTE — MAU Provider Note (Signed)
?History  ? Pt is a 41yo G98P2002 female at 75wks 2/[redacted]wks gestation here with vaginal bleeding. Pt has had bleeding since first found out was pregnant at [redacted] weeks gestation. Bleeding has been intermittent but never as heavy as today  ?She was seen in the office three days ago for a viability scan - all wnl. She reports begun bleeding again a day later - passed large clots. She started feeling dizzy and weak today hence came to MAU ?  ? ?CSN: [id number] ? ?Arrival date and time: 02/14/22 0943 ? ? Event Date/Time  ? First Provider Initiated Contact with Patient 02/14/22 1003   ?  ? ?Chief Complaint  ?Patient presents with  ? Abdominal Pain  ? Vaginal Bleeding  ? ?HPI ? ?OB History   ? ? Gravida  ?3  ? Para  ?2  ? Term  ?2  ? Preterm  ?   ? AB  ?   ? Living  ?1  ?  ? ? SAB  ?   ? IAB  ?   ? Ectopic  ?   ? Multiple  ?0  ? Live Births  ?1  ?   ?  ?  ? ? ?Past Medical History:  ?Diagnosis Date  ? Bronchitis   ? for last month.  started antibiotic 04/19/17  ? Family history of breast cancer   ? Family history of breast cancer   ? Family history of colon cancer   ? Family history of kidney cancer   ? Family history of ovarian cancer   ? Family history of uterine cancer   ? Hypothyroidism   ? Migraine headache   ? "seems r/t menstrual cycle"  ? Motion sickness   ? all vehicles  ? ? ?Past Surgical History:  ?Procedure Laterality Date  ? CESAREAN SECTION  04/2010  ? CESAREAN SECTION N/A 01/10/2015  ? Procedure: CESAREAN SECTION;  Surgeon: Melina Schools, MD;  Location: [facility];  Service: Obstetrics;  Laterality: N/A;  ? COLONOSCOPY    ? COLONOSCOPY WITH PROPOFOL N/A 04/30/2017  ? Procedure: COLONOSCOPY WITH PROPOFOL;  Surgeon: Lucilla Lame, MD;  Location: [facility];  Service: Endoscopy;  Laterality: N/A;  ? laprascopic    ? exploratory  ? OTHER SURGICAL HISTORY  2015  ? IVF egg retrieval  ? ? ?Family History  ?Problem Relation Age of Onset  ? Migraines Mother   ? Hyperlipidemia Mother   ? Anxiety disorder Mother 82  ? Breast  cancer Mother 73  ? Hyperlipidemia Father   ? Colon cancer Father   ? Melanoma Father   ? Kidney cancer Paternal Uncle   ? Dementia Maternal Grandmother   ? Breast cancer Maternal Grandmother 58  ? Endometrial cancer Maternal Grandmother   ?     dx 48s  ? Stroke Maternal Grandfather   ? Aneurysm Paternal Grandfather   ? Alcohol abuse Neg Hx   ? Arthritis Neg Hx   ? Asthma Neg Hx   ? Birth defects Neg Hx   ? COPD Neg Hx   ? Depression Neg Hx   ? Diabetes Neg Hx   ? Drug abuse Neg Hx   ? Early death Neg Hx   ? Hearing loss Neg Hx   ? Heart disease Neg Hx   ? Hypertension Neg Hx   ? Kidney disease Neg Hx   ? Learning disabilities Neg Hx   ? Mental illness Neg Hx   ? Mental retardation Neg Hx   ?  Miscarriages / Stillbirths Neg Hx   ? Vision loss Neg Hx   ? Varicose Veins Neg Hx   ? ? ?Social History  ? ?Tobacco Use  ? Smoking status: Never  ? Smokeless tobacco: Never  ?Substance Use Topics  ? Alcohol use: Yes  ?  Alcohol/week: 4.0 standard drinks  ?  Types: 4 Glasses of wine per week  ?  Comment: rarely  ? Drug use: No  ? ? ?Allergies:  ?Allergies  ?Allergen Reactions  ? Adhesive [Tape] Rash  ?  Some bandaids  ? Neosporin [Neomycin-Bacitracin Zn-Polymyx] Rash  ? Sulfa Antibiotics Rash  ? ? ?Medications Prior to Admission  ?Medication Sig Dispense Refill Last Dose  ? levothyroxine (SYNTHROID) 50 MCG tablet TAKE 1 TABLET BY MOUTH EVERY DAY BEFORE BREAKFAST 90 tablet 0   ? loratadine (CLARITIN) 10 MG tablet TAKE 1 TABLET BY MOUTH EVERY DAY 90 tablet 3   ? sertraline (ZOLOFT) 50 MG tablet TAKE 1 AND 1/2 TABLETS BY MOUTH DAILY 135 tablet 1   ? SPIRONOLACTONE PO Take by mouth.     ? SUMAtriptan (IMITREX) 50 MG tablet Take 1 tablet (50 mg total) by mouth every 2 (two) hours as needed for migraine. May repeat in 2 hours if headache persists or recurs. 10 tablet 2   ? ? ?Review of Systems ?Physical Exam  ? ?Blood pressure 101/60, pulse 65, temperature 98.3 ?F (36.8 ?C), temperature source Oral, resp. rate 18, SpO2 97  %. ? ?Physical Exam ? ?MAU Course  ?Procedures ? ?MDM ? ? ?Assessment and Plan  ?41yo G49P2002 female here with first trimester bleeding at [redacted]weeks gestation ?- [place] - noted viable IUP with + FHTs; clot in vagina  ?- Hemodynamically stable ( Hg 11+ ) despite 1gm drop  ?- VSS ?Discussed above results with pt and husband. Offered overnight observation vs bedrest at home  ?They decided to stay to be observed overnight  ? ?Isaiah Serge ?02/14/2022, 1:00 PM  ?

## 2022-02-15 MED ORDER — ACETAMINOPHEN 325 MG PO TABS: 650.0000 mg | ORAL_TABLET | ORAL | 0 refills | Status: DC | PRN

## 2022-02-15 NOTE — Progress Notes (Signed)
Patient ID: Brandi [name], female   DOB: 06-03-1981, 41 y.o.   MRN: [id number] ?Pt reports no further bleeding overnight.  No cramping or pain ? ?Afeb  BP runs on low side normally for her 90-100/40-51 ?Hgb 11.5 yesterday ? ?[place] yesterday with 62w4dCRL and no abnormalities  FHR 170's ? ? ?Long d/w pt and husband about the unknown etiology of the bleeding and acknowledgement of how stressful this is to experience.  We discussed she could bleed more and if light VB does not necessarily have to come for reassessment, but if gets alarmingly heavy again would need to come back in. ?She is wanting to go home and will stay out of work this week, just for less stress.   ?Plan f/u [facility] 02/24/22 ?

## 2022-02-16 LAB — TYPE AND SCREEN
ABO/RH(D): A POS
Antibody Screen: NEGATIVE
Unit division: 0
Unit division: 0

## 2022-02-16 LAB — BPAM RBC
Blood Product Expiration Date: 202304042359
Blood Product Expiration Date: 202304042359
Unit Type and Rh: 6200
Unit Type and Rh: 6200

## 2022-02-16 NOTE — Discharge Summary (Signed)
Physician Discharge Summary  ?Patient ID: ?Brandi [name] ?MRN: [id number] ?DOB/AGE: Jan 19, 1981 41 y.o. ? ?Admit date: 02/14/2022 ?Discharge date: 02/15/22 ? ?Admission Diagnoses: ?Viable IUP at 8 weeks with heavy vaginal bleeding ? ?Discharge Diagnoses:  ?Principal Problem: ?  Antepartum bleeding, first trimester ? ? ?Discharged Condition: good ? ?Hospital Course: Pt admitted to observation after arrived with heavy VB and passing clots.  US showed a viable IUP measuring 8 weeks and 4 days with no obvious source of bleeding, no subchorionic hemorrhage.  Hgb was stable at 11+ and the bleeding slowed and stopped over first hour of admission.  She was observed for 24 hours and no new bleeding occurred.  She was felt stable for d/c home with pelvic rest and will f/u in office 02/24/22 for repeat US unless heavy bleeding resumes. ? ?Consults: None ? ?Significant Diagnostic Studies: radiology: Ultrasound:   ? ? ? ?Discharge Exam: ?Blood pressure (!) 88/47, pulse 64, temperature 98.7 ?F (37.1 ?C), temperature source Oral, resp. rate 14, SpO2 98 %. ?General appearance: alert and cooperative ?Cardio: regular rate and rhythm ?GI: soft NT ? ?Disposition: Discharge disposition: 01-Home or Self Care ? ? ? ? ? ? ?Discharge Instructions   ? ? Diet - low sodium heart healthy   Complete by: As directed ?  ? Discharge instructions   Complete by: As directed ?  ? No sexual activity for next 3 weeks.  Call if heavy bleeding returns  ? ?  ? ?Allergies as of 02/15/2022   ? ?   Reactions  ? Adhesive [tape] Rash  ? Some bandaids  ? Neosporin [neomycin-bacitracin Zn-polymyx] Rash  ? Sulfa Antibiotics Rash  ? ?  ? ?  ?Medication List  ?  ? ?STOP taking these medications   ? ?SPIRONOLACTONE PO ?  ?SUMAtriptan 50 MG tablet ?Commonly known as: IMITREX ?  ? ?  ? ?TAKE these medications   ? ?acetaminophen 325 MG tablet ?Commonly known as: TYLENOL ?Take 2 tablets (650 mg total) by mouth every 4 (four) hours as needed (for pain scale < 4  OR  temperature   >/=  100.5 F). ?  ?levothyroxine 50 MCG tablet ?Commonly known as: SYNTHROID ?TAKE 1 TABLET BY MOUTH EVERY DAY BEFORE BREAKFAST ?  ?loratadine 10 MG tablet ?Commonly known as: CLARITIN ?TAKE 1 TABLET BY MOUTH EVERY DAY ?  ?sertraline 50 MG tablet ?Commonly known as: ZOLOFT ?TAKE 1 AND 1/2 TABLETS BY MOUTH DAILY ?  ? ?  ? ? Follow-up Information   ? ? Paula Compton, MD. Schedule an appointment as soon as possible for a visit.   ?Specialty: Obstetrics and Gynecology ?Why: Office will call with appointment for [place] 02/24/22 ?Contact information: ?[address] ?[address] ?[place] [address] ?[phone] ? ? ?  ?  ? ?  ?  ? ?  ? ? ?Signed: ?Logan Bores ?02/16/2022, 7:49 PM ? ? ?

## 2022-02-24 DIAGNOSIS — Z3A09 9 weeks gestation of pregnancy: Secondary | ICD-10-CM | POA: Diagnosis not present

## 2022-02-24 DIAGNOSIS — O3680X0 Pregnancy with inconclusive fetal viability, not applicable or unspecified: Secondary | ICD-10-CM | POA: Diagnosis not present

## 2022-02-25 NOTE — H&P (Signed)
Pt is a 41yo G51P2002 female at 3wks 2/[redacted]wks gestation here with vaginal bleeding. Pt has had bleeding since first found out was pregnant at [redacted] weeks gestation. Bleeding has been intermittent but never as heavy as today  ?She was seen in the office three days ago for a viability scan - all wnl. She reports begun bleeding again a day later - passed large clots. She started feeling dizzy and weak today hence came to MAU ?  ?  ?CSN: [id number] ?  ?Arrival date and time: 02/14/22 0943 ?  ? Event Date/Time  ? First Provider Initiated Contact with Patient 02/14/22 1003   ?  ?  ?   ?Chief Complaint  ?Patient presents with  ? Abdominal Pain  ? Vaginal Bleeding  ?  ?HPI ?  ?OB History   ?  ?  Gravida  ?3  ? Para  ?2  ? Term  ?2  ? Preterm  ?   ? AB  ?   ? Living  ?1  ?  ?  ?  SAB  ?   ? IAB  ?   ? Ectopic  ?   ? Multiple  ?0  ? Live Births  ?1  ?    ?  ?   ?  ?  ?    ?Past Medical History:  ?Diagnosis Date  ? Bronchitis    ?  for last month.  started antibiotic 04/19/17  ? Family history of breast cancer    ? Family history of breast cancer    ? Family history of colon cancer    ? Family history of kidney cancer    ? Family history of ovarian cancer    ? Family history of uterine cancer    ? Hypothyroidism    ? Migraine headache    ?  "seems r/t menstrual cycle"  ? Motion sickness    ?  all vehicles  ?  ?  ?     ?Past Surgical History:  ?Procedure Laterality Date  ? CESAREAN SECTION   04/2010  ? CESAREAN SECTION N/A 01/10/2015  ?  Procedure: CESAREAN SECTION;  Surgeon: Melina Schools, MD;  Location: [facility];  Service: Obstetrics;  Laterality: N/A;  ? COLONOSCOPY      ? COLONOSCOPY WITH PROPOFOL N/A 04/30/2017  ?  Procedure: COLONOSCOPY WITH PROPOFOL;  Surgeon: Lucilla Lame, MD;  Location: [facility];  Service: Endoscopy;  Laterality: N/A;  ? laprascopic      ?  exploratory  ? OTHER SURGICAL HISTORY   2015  ?  IVF egg retrieval  ?  ?  ?     ?Family History  ?Problem Relation Age of Onset  ? Migraines Mother    ?  Hyperlipidemia Mother    ? Anxiety disorder Mother 50  ? Breast cancer Mother 52  ? Hyperlipidemia Father    ? Colon cancer Father    ? Melanoma Father    ? Kidney cancer Paternal Uncle    ? Dementia Maternal Grandmother    ? Breast cancer Maternal Grandmother 57  ? Endometrial cancer Maternal Grandmother    ?      dx 68s  ? Stroke Maternal Grandfather    ? Aneurysm Paternal Grandfather    ? Alcohol abuse Neg Hx    ? Arthritis Neg Hx    ? Asthma Neg Hx    ? Birth defects Neg Hx    ? COPD Neg Hx    ? Depression Neg  Hx    ? Diabetes Neg Hx    ? Drug abuse Neg Hx    ? Early death Neg Hx    ? Hearing loss Neg Hx    ? Heart disease Neg Hx    ? Hypertension Neg Hx    ? Kidney disease Neg Hx    ? Learning disabilities Neg Hx    ? Mental illness Neg Hx    ? Mental retardation Neg Hx    ? Miscarriages / Stillbirths Neg Hx    ? Vision loss Neg Hx    ? Varicose Veins Neg Hx    ?  ?  ?Social History  ?  ?     ?Tobacco Use  ? Smoking status: Never  ? Smokeless tobacco: Never  ?Substance Use Topics  ? Alcohol use: Yes  ?    Alcohol/week: 4.0 standard drinks  ?    Types: 4 Glasses of wine per week  ?    Comment: rarely  ? Drug use: No  ?  ?  ?Allergies:  ?     ?Allergies  ?Allergen Reactions  ? Adhesive [Tape] Rash  ?    Some bandaids  ? Neosporin [Neomycin-Bacitracin Zn-Polymyx] Rash  ? Sulfa Antibiotics Rash  ?  ?  ?       ?Medications Prior to Admission  ?Medication Sig Dispense Refill Last Dose  ? levothyroxine (SYNTHROID) 50 MCG tablet TAKE 1 TABLET BY MOUTH EVERY DAY BEFORE BREAKFAST 90 tablet 0    ? loratadine (CLARITIN) 10 MG tablet TAKE 1 TABLET BY MOUTH EVERY DAY 90 tablet 3    ? sertraline (ZOLOFT) 50 MG tablet TAKE 1 AND 1/2 TABLETS BY MOUTH DAILY 135 tablet 1    ? SPIRONOLACTONE PO Take by mouth.        ? SUMAtriptan (IMITREX) 50 MG tablet Take 1 tablet (50 mg total) by mouth every 2 (two) hours as needed for migraine. May repeat in 2 hours if headache persists or recurs. 10 tablet 2    ?  ?  ?Review of  Systems ?Physical Exam  ?  ?Blood pressure 101/60, pulse 65, temperature 98.3 ?F (36.8 ?C), temperature source Oral, resp. rate 18, SpO2 97 %. ?  ?Physical Exam ?  ?MAU Course  ?Procedures ?  ?MDM ?  ?  ?Assessment and Plan  ?41yo G9P2002 female here with first trimester bleeding at [redacted]weeks gestation ?- [place] - noted viable IUP with + FHTs; clot in vagina  ?- Hemodynamically stable ( Hg 11+ ) despite 1gm drop  ?- VSS ?Discussed above results with pt and husband. Offered overnight observation vs bedrest at home  ?They decided to stay to be observed overnight  ?  ?Isaiah Serge ?02/14/2022, 1:00 PM   ?  ?  ?  ? ?Note Details ? ?Thalia Bloodgood, Bonnee Quin, DO File Time 02/14/2022  1:14 PM  ?Author Type Physician Status Signed  ?Last Editor Sherlyn Hay, DO Service Obstetrics/Gynecology  ?Hospital Acct # [id number] Admit Date 02/14/2022  ? ?

## 2022-03-04 DIAGNOSIS — Z368A Encounter for antenatal screening for other genetic defects: Secondary | ICD-10-CM | POA: Diagnosis not present

## 2022-03-04 DIAGNOSIS — Z113 Encounter for screening for infections with a predominantly sexual mode of transmission: Secondary | ICD-10-CM | POA: Diagnosis not present

## 2022-03-04 DIAGNOSIS — Z363 Encounter for antenatal screening for malformations: Secondary | ICD-10-CM | POA: Diagnosis not present

## 2022-03-04 DIAGNOSIS — Z1371 Encounter for nonprocreative screening for genetic disease carrier status: Secondary | ICD-10-CM | POA: Diagnosis not present

## 2022-03-04 DIAGNOSIS — Z3689 Encounter for other specified antenatal screening: Secondary | ICD-10-CM | POA: Diagnosis not present

## 2022-03-04 LAB — OB RESULTS CONSOLE HEPATITIS B SURFACE ANTIGEN: Hepatitis B Surface Ag: NEGATIVE

## 2022-03-04 LAB — OB RESULTS CONSOLE RPR: RPR: NONREACTIVE

## 2022-03-04 LAB — OB RESULTS CONSOLE RUBELLA ANTIBODY, IGM: Rubella: IMMUNE

## 2022-03-04 LAB — OB RESULTS CONSOLE HIV ANTIBODY (ROUTINE TESTING): HIV: NONREACTIVE

## 2022-04-25 DIAGNOSIS — O4402 Placenta previa specified as without hemorrhage, second trimester: Secondary | ICD-10-CM | POA: Diagnosis not present

## 2022-04-25 DIAGNOSIS — Z3A18 18 weeks gestation of pregnancy: Secondary | ICD-10-CM | POA: Diagnosis not present

## 2022-04-29 DIAGNOSIS — Z3A18 18 weeks gestation of pregnancy: Secondary | ICD-10-CM | POA: Diagnosis not present

## 2022-04-29 DIAGNOSIS — Z363 Encounter for antenatal screening for malformations: Secondary | ICD-10-CM | POA: Diagnosis not present

## 2022-04-29 DIAGNOSIS — O4442 Low lying placenta NOS or without hemorrhage, second trimester: Secondary | ICD-10-CM | POA: Diagnosis not present

## 2022-04-29 DIAGNOSIS — O09519 Supervision of elderly primigravida, unspecified trimester: Secondary | ICD-10-CM | POA: Diagnosis not present

## 2022-05-08 ENCOUNTER — Other Ambulatory Visit: Payer: Self-pay | Admitting: Family Medicine

## 2022-05-08 DIAGNOSIS — F419 Anxiety disorder, unspecified: Secondary | ICD-10-CM

## 2022-05-09 ENCOUNTER — Other Ambulatory Visit: Payer: Self-pay | Admitting: Family Medicine

## 2022-05-15 IMAGING — US US OB < 14 WEEKS - US OB TV
1 series · 15 of 28 positions shown · non-contrast
Comparison: None.

CLINICAL DATA: Vaginal bleeding.

EXAM:
OBSTETRIC <14 WK US AND TRANSVAGINAL OB US
TECHNIQUE: Both transabdominal and transvaginal ultrasound examinations were
performed for complete evaluation of the gestation as well as the
maternal uterus, adnexal regions, and pelvic cul-de-sac.
Transvaginal technique was performed to assess early pregnancy.

[Series 2: us ob < 14 weeks - us ob tv · 15 of 105 slices shown]
[im 1/105]
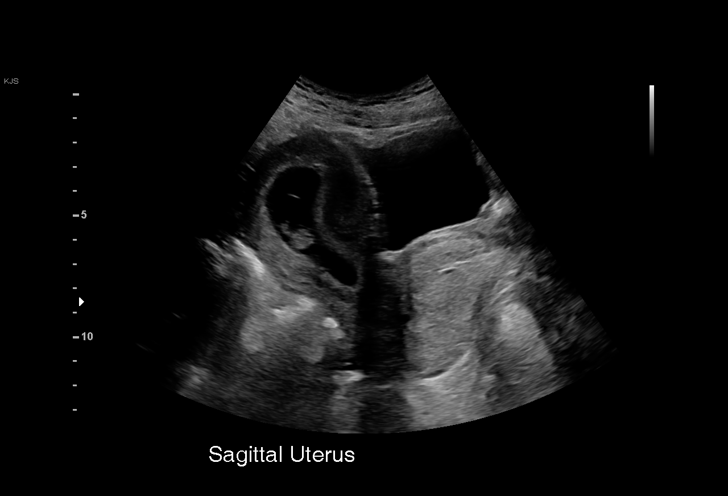
[im 8/105]
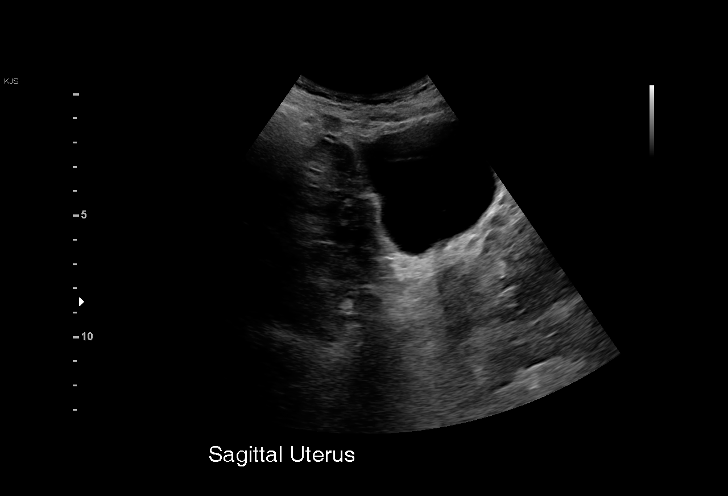
[im 16/105]
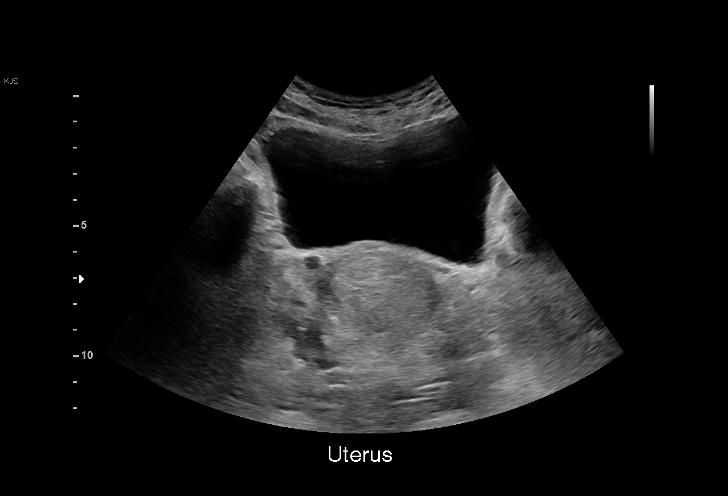
[im 24/105]
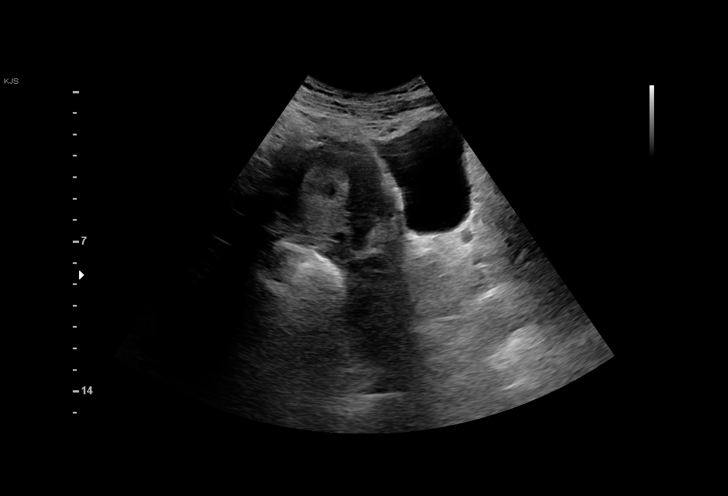
[im 31/105]
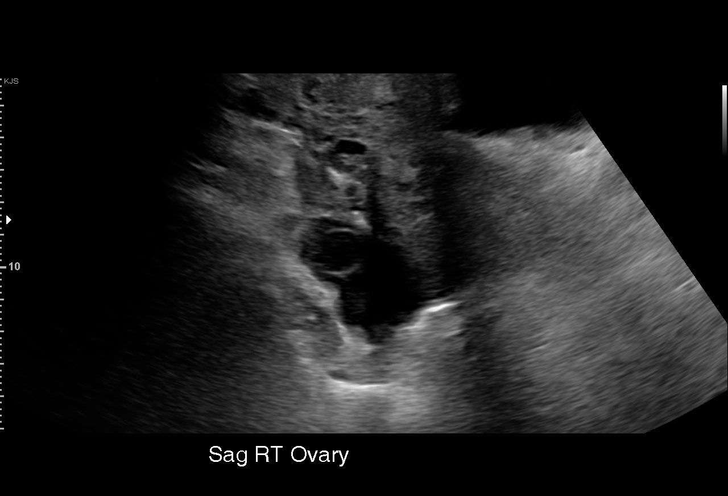
[im 39/105]
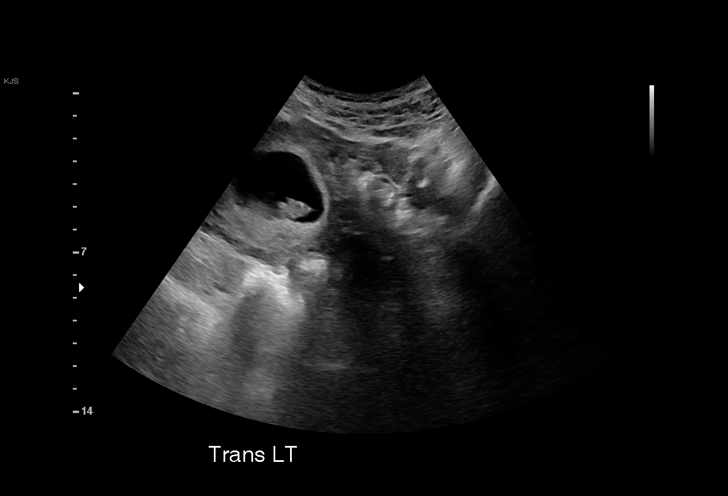
[im 47/105]
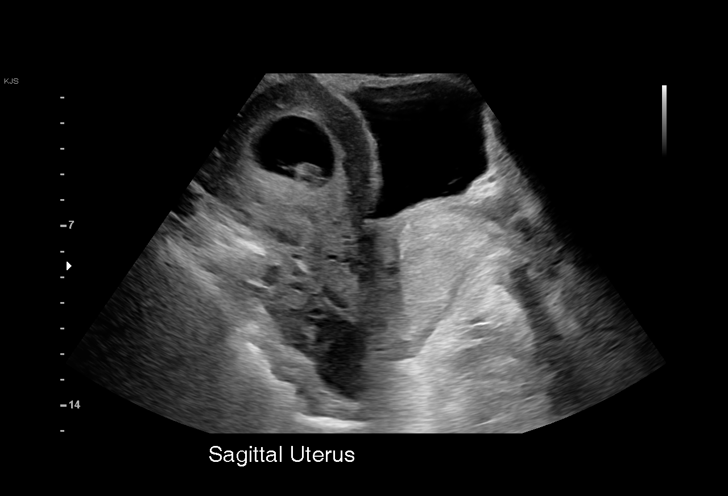
[im 54/105]
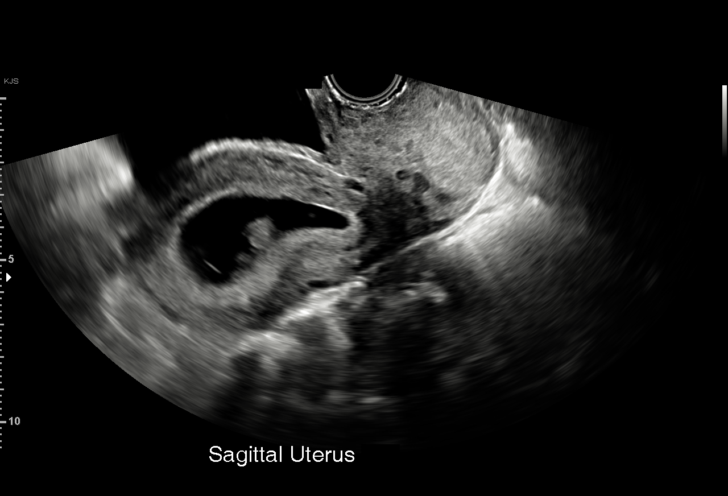
[im 58/105]
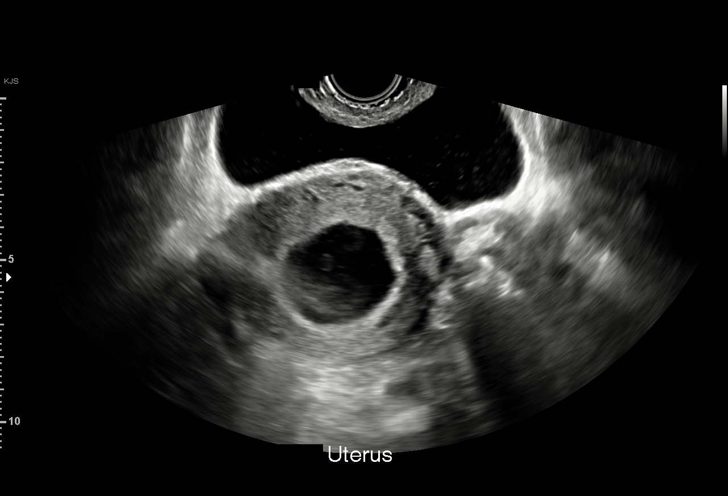
[im 66/105]
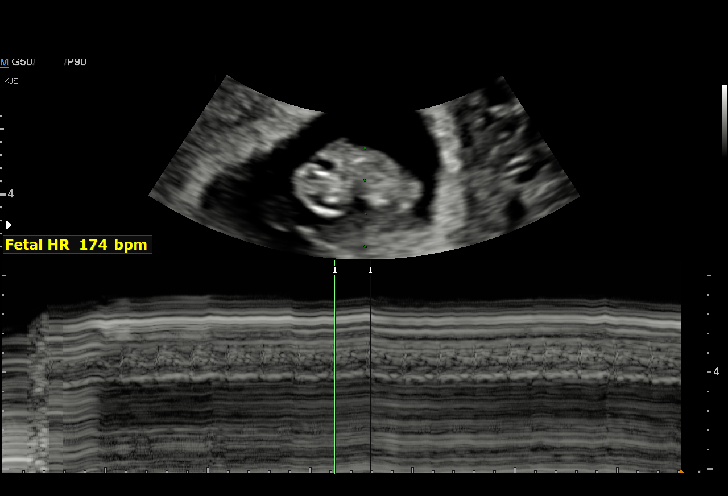
[im 74/105]
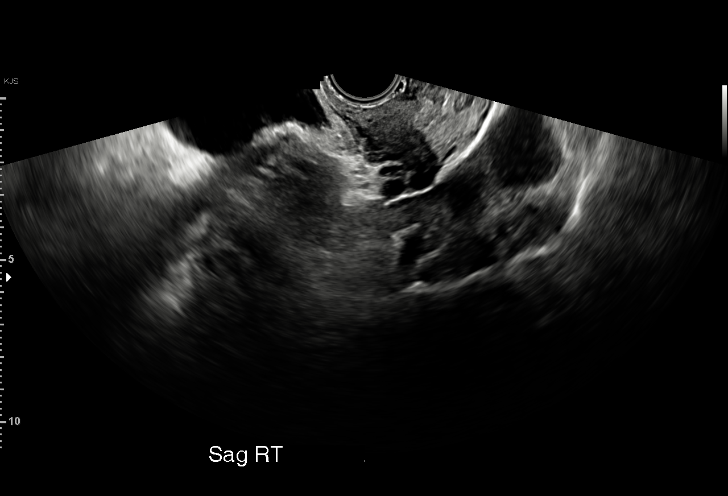
[im 81/105]
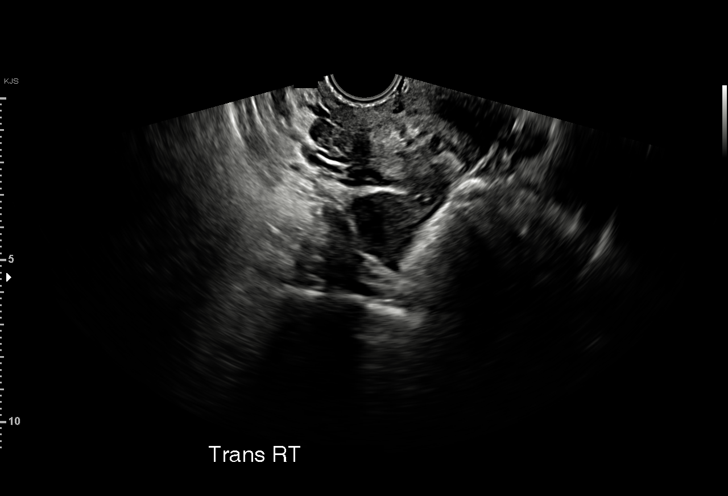
[im 89/105]
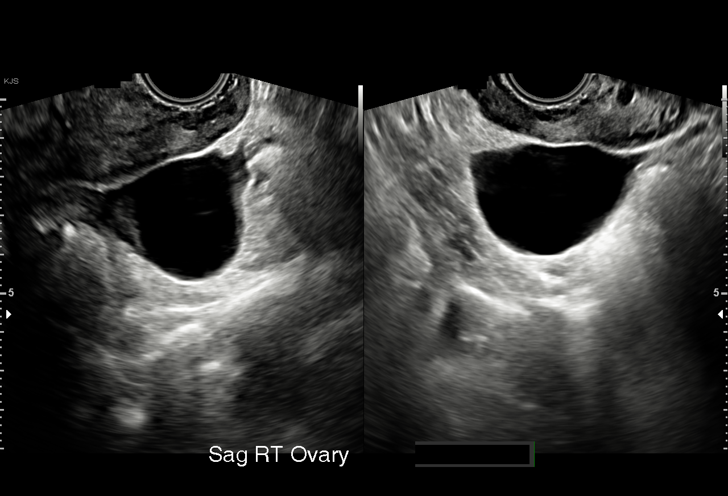
[im 97/105]
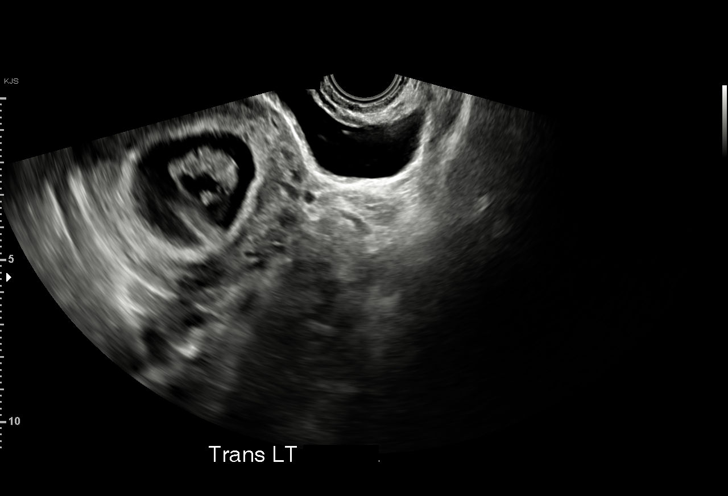
[im 105/105]
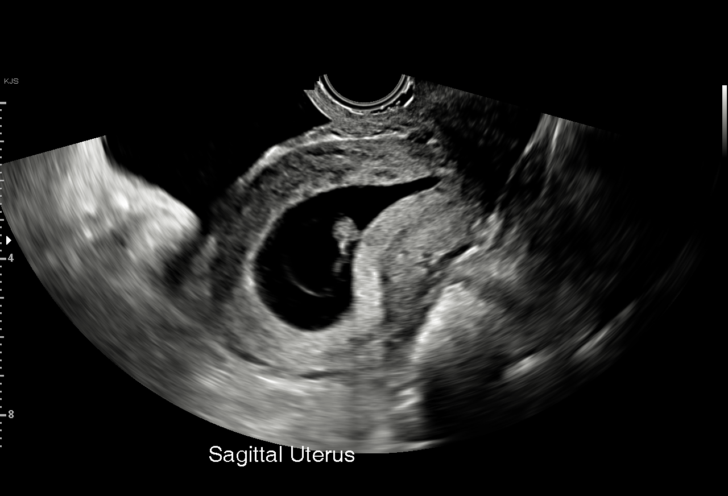

[15 of 28 positions shown; findings below may reference images not displayed]

FINDINGS: Intrauterine gestational sac: Single

Yolk sac:  Visualized.

Embryo:  Visualized.

Cardiac Activity: Visualized.

Heart Rate: 174 bpm

MSD:   mm    w     d

CRL:  20.4 mm   8 w   4 d                  US EDC: September 22, 2022

Subchorionic hemorrhage:  None visualized.

Maternal uterus/adnexae: The ovaries are unremarkable. There is a
dominant follicle measuring 3.7 cm in the right ovary of no
significance. Suggested blood clot in the vagina consistent with
heavy bleeding during the scan.
IMPRESSION: 1. Single live IUP.  No subchorionic hemorrhage identified.
2. The ovaries are normal.
3. Probable blood clot in the vaginal canal consistent with bleeding
during the scan.

## 2022-05-18 ENCOUNTER — Inpatient Hospital Stay (HOSPITAL_BASED_OUTPATIENT_CLINIC_OR_DEPARTMENT_OTHER): Payer: BC Managed Care – PPO

## 2022-05-18 ENCOUNTER — Observation Stay (HOSPITAL_COMMUNITY)
Admission: AD | Admit: 2022-05-18 | Discharge: 2022-05-20 | Disposition: A | Discharge status: Home / Self Care | Payer: BC Managed Care – PPO | Attending: Obstetrics and Gynecology | Admitting: Obstetrics and Gynecology

## 2022-05-18 ENCOUNTER — Encounter (HOSPITAL_COMMUNITY): Payer: Self-pay | Admitting: Obstetrics and Gynecology

## 2022-05-18 ENCOUNTER — Other Ambulatory Visit: Payer: Self-pay

## 2022-05-18 DIAGNOSIS — O4412 Placenta previa with hemorrhage, second trimester: Secondary | ICD-10-CM | POA: Diagnosis not present

## 2022-05-18 DIAGNOSIS — D649 Anemia, unspecified: Secondary | ICD-10-CM | POA: Diagnosis not present

## 2022-05-18 DIAGNOSIS — Z3A21 21 weeks gestation of pregnancy: Secondary | ICD-10-CM

## 2022-05-18 DIAGNOSIS — O4692 Antepartum hemorrhage, unspecified, second trimester: Secondary | ICD-10-CM | POA: Diagnosis not present

## 2022-05-18 DIAGNOSIS — F419 Anxiety disorder, unspecified: Secondary | ICD-10-CM | POA: Insufficient documentation

## 2022-05-18 DIAGNOSIS — Z79899 Other long term (current) drug therapy: Secondary | ICD-10-CM | POA: Insufficient documentation

## 2022-05-18 DIAGNOSIS — O4402 Placenta previa specified as without hemorrhage, second trimester: Secondary | ICD-10-CM | POA: Diagnosis present

## 2022-05-18 DIAGNOSIS — O99012 Anemia complicating pregnancy, second trimester: Secondary | ICD-10-CM | POA: Diagnosis not present

## 2022-05-18 DIAGNOSIS — E039 Hypothyroidism, unspecified: Secondary | ICD-10-CM | POA: Insufficient documentation

## 2022-05-18 DIAGNOSIS — O09522 Supervision of elderly multigravida, second trimester: Secondary | ICD-10-CM | POA: Insufficient documentation

## 2022-05-18 DIAGNOSIS — Z98891 History of uterine scar from previous surgery: Secondary | ICD-10-CM | POA: Insufficient documentation

## 2022-05-18 DIAGNOSIS — O34219 Maternal care for unspecified type scar from previous cesarean delivery: Secondary | ICD-10-CM | POA: Insufficient documentation

## 2022-05-18 DIAGNOSIS — O99282 Endocrine, nutritional and metabolic diseases complicating pregnancy, second trimester: Secondary | ICD-10-CM | POA: Insufficient documentation

## 2022-05-18 DIAGNOSIS — O99342 Other mental disorders complicating pregnancy, second trimester: Secondary | ICD-10-CM | POA: Insufficient documentation

## 2022-05-18 LAB — KLEIHAUER-BETKE STAIN
# Vials RhIg: 1
Fetal Cells %: 0.2 %
Quantitation Fetal Hemoglobin: 10 mL

## 2022-05-18 LAB — FIBRINOGEN: Fibrinogen: 314 mg/dL (ref 210–475)

## 2022-05-18 LAB — CBC
HCT: 32.9 % — ABNORMAL LOW (ref 36.0–46.0)
Hemoglobin: 11 g/dL — ABNORMAL LOW (ref 12.0–15.0)
MCH: 30.3 pg (ref 26.0–34.0)
MCHC: 33.4 g/dL (ref 30.0–36.0)
MCV: 90.6 fL (ref 80.0–100.0)
Platelets: 265 10*3/uL (ref 150–400)
RBC: 3.63 MIL/uL — ABNORMAL LOW (ref 3.87–5.11)
RDW: 12.5 % (ref 11.5–15.5)
WBC: 6.6 10*3/uL (ref 4.0–10.5)
nRBC: 0 % (ref 0.0–0.2)

## 2022-05-18 MED ORDER — SERTRALINE HCL 50 MG PO TABS
50.0000 mg | ORAL_TABLET | Freq: Every day | ORAL | Status: DC
  Administered 2022-05-18 – 2022-05-20 (×3): 50 mg via ORAL
  Filled 2022-05-18 (×2): qty 1

## 2022-05-18 MED ORDER — SERTRALINE HCL 50 MG PO TABS
75.0000 mg | ORAL_TABLET | Freq: Every day | ORAL | Status: DC
  Filled 2022-05-18: qty 2

## 2022-05-18 MED ORDER — ACETAMINOPHEN 325 MG PO TABS
650.0000 mg | ORAL_TABLET | ORAL | Status: DC | PRN
  Administered 2022-05-18 – 2022-05-19 (×3): 650 mg via ORAL
  Filled 2022-05-18 (×3): qty 2

## 2022-05-18 MED ORDER — LEVOTHYROXINE SODIUM 25 MCG PO TABS
50.0000 ug | ORAL_TABLET | Freq: Every day | ORAL | Status: DC
Start: 2022-05-18 — End: 2022-05-20
  Administered 2022-05-18 – 2022-05-20 (×3): 50 ug via ORAL
  Filled 2022-05-18 (×3): qty 2

## 2022-05-18 MED ORDER — ZOLPIDEM TARTRATE 5 MG PO TABS: 5.0000 mg | ORAL_TABLET | Freq: Every evening | ORAL | Status: DC | PRN

## 2022-05-18 MED ORDER — PRENATAL MULTIVITAMIN CH
1.0000 | ORAL_TABLET | Freq: Every day | ORAL | Status: DC
  Administered 2022-05-18 – 2022-05-19 (×2): 1 via ORAL
  Filled 2022-05-18 (×2): qty 1

## 2022-05-18 MED ORDER — DOCUSATE SODIUM 100 MG PO CAPS
100.0000 mg | ORAL_CAPSULE | Freq: Every day | ORAL | Status: DC
  Filled 2022-05-18 (×3): qty 1

## 2022-05-18 MED ORDER — CALCIUM CARBONATE ANTACID 500 MG PO CHEW: 2.0000 | CHEWABLE_TABLET | ORAL | Status: DC | PRN

## 2022-05-18 NOTE — H&P (Addendum)
Brandi [name] is a 41 y.o. female presenting for evaluation of vaginal bleeding. States that at 0300, had a gush of red blood soaking a kitchen towel. Denies cramping, endorses +flutters. Known placenta previa. Presented quickly to MAU for eval. Last PO intake approx 2300 on 6/24  [facility] c/b the following 1) h/o csx x2 - stat followed by failed TOLAC; possible CPD x2, repeat planned 2) placenta previa - pt with prior heavy VB episode around 10wks on OBSC (notes available) - repeat VB with clots on 6/2 led to US showing placenta previa. Aware of need for rescan to assess 37 vs 39 wk section 3) hypothyroidism - on synthroid QD 4) Anxiety - stable on zoloft 75mg  QD 5) H/o IVF - with G2, spontaneous conception this pregnancy (has 42 yo and 42 yo) OB History     Gravida  3   Para  2   Term  2   Preterm      AB      Living  1      SAB      IAB      Ectopic      Multiple  0   Live Births  1          Past Medical History:  Diagnosis Date   Bronchitis    for last month.  started antibiotic 04/19/17   Family history of breast cancer    Family history of breast cancer    Family history of colon cancer    Family history of kidney cancer    Family history of ovarian cancer    Family history of uterine cancer    Hypothyroidism    Migraine headache    "seems r/t menstrual cycle"   Motion sickness    all vehicles   Past Surgical History:  Procedure Laterality Date   CESAREAN SECTION  04/2010   CESAREAN SECTION N/A 01/10/2015   Procedure: CESAREAN SECTION;  Surgeon: Bing Plume, MD;  Location: [facility];  Service: Obstetrics;  Laterality: N/A;   COLONOSCOPY     COLONOSCOPY WITH PROPOFOL N/A 04/30/2017   Procedure: COLONOSCOPY WITH PROPOFOL;  Surgeon: Midge Minium, MD;  Location: [facility];  Service: Endoscopy;  Laterality: N/A;   laprascopic     exploratory   OTHER SURGICAL HISTORY  2015   IVF egg retrieval   Family History: family history includes Aneurysm  in her paternal grandfather; Anxiety disorder (age of onset: 63) in her mother; Breast cancer (age of onset: 73) in her maternal grandmother and mother; Colon cancer in her father; Dementia in her maternal grandmother; Endometrial cancer in her maternal grandmother; Hyperlipidemia in her father and mother; Kidney cancer in her paternal uncle; Melanoma in her father; Migraines in her mother; Stroke in her maternal grandfather. Social History:  reports that she has never smoked. She has never used smokeless tobacco. She reports current alcohol use of about 4.0 standard drinks of alcohol per week. She reports that she does not use drugs.      Genetic Screening: Normal Maternal Ultrasounds/Referrals: Other: placenta previa Fetal Ultrasounds or other Referrals:  None Maternal Substance Abuse:  No Significant Maternal Medications:  Meds include: Zoloft Syntroid Significant Maternal Lab Results:  None Other Comments:  None  Review of Systems  Constitutional:  Negative for chills and fever.  Respiratory:  Negative for shortness of breath.   Cardiovascular:  Negative for chest pain, palpitations and leg swelling.  Gastrointestinal:  Negative for abdominal pain, nausea and vomiting.  Genitourinary:  Positive for vaginal bleeding.  Neurological:  Negative for dizziness, weakness and headaches.  Psychiatric/Behavioral:  Negative for suicidal ideas.    Maternal Medical History:  Reason for admission: Vaginal bleeding.  Nausea.  Contractions: none Fetal activity: Perceived fetal activity is normal.   Prenatal complications: Placental abnormality.       Blood pressure 110/70, pulse 90, temperature 98.6 F (37 C), temperature source Oral, resp. rate 18, SpO2 100 %. Exam Physical Exam Constitutional:      General: She is not in acute distress.    Appearance: She is well-developed.  HENT:     Head: Normocephalic and atraumatic.  Eyes:     Pupils: Pupils are equal, round, and reactive to  light.  Cardiovascular:     Rate and Rhythm: Normal rate and regular rhythm.     Heart sounds: No murmur heard.    No gallop.  Abdominal:     Tenderness: There is no abdominal tenderness. There is no guarding or rebound.     Comments: F@U   Genitourinary:    Comments: Internal exam deferred. Dried old blood at perineum. No extrusion of bleeding or clot with valsalva x3 Musculoskeletal:        General: Normal range of motion.     Cervical back: Normal range of motion and neck supple.  Skin:    General: Skin is warm and dry.  Neurological:     Mental Status: She is alert and oriented to person, place, and time.     Prenatal labs: ABO, Rh: --/--/A POS (06/25 0406) Antibody: NEG (06/25 0406) Rubella:  immune RPR:   nr HBsAg:   meg HIV:   nr  BSUS prelim report: FHR 149, vtx, posterior placenta previa noted, no evidence of abruption. CL 3cm, MVP 4.9cm  Assessment/Plan: This is a 40yo [id number] @ 21 4/7  by LMP c/w 8wk scan admitted with painless vaginal bleeding episode in setting of known placenta previa. H/o csx x2. No active bleeding at time of exam, BSUS shows close cervix. Reviewed in detail current ultrasound and exam. Also reviewed previable status at 83 4/7. Patient is hemodynamically stable at this time, H/H on admission 11/32.9, stable from prior. Plan for MFM consult in AM to review imaging and discuss further accommodations in pregnancy - patient already on pelvic rest and light duty at work Oceanographer) at this time. Given no further bleed, allow to eat, Dopplers Q shift, routine AP admission orders. ABO Apos. All questions answered in detail  Continue home synthroid and Zoloft during observation  Carlisle Cater 05/18/2022, 7:06 AM

## 2022-05-18 NOTE — MAU Provider Note (Signed)
History     CSN: [id number]  Arrival date and time: 05/18/22 1093   Event Date/Time   First Provider Initiated Contact with Patient 05/18/22 0401      No chief complaint on file.  Brandi [name] is a 41 y.o. G3P2001 at 76w[facility]  She presents today for vaginal bleeding. She reports she got up and had heavy vaginal bleeding around 0315.  She denies abdominal cramping or vaginal discharge prior to the bleeding.  However, she does endorse a history of previa that was diagnosed ~ 2 weeks ago.  She states she has had intermittent spotting the last few days.  Patient reports last fetal movement was detected 5 minutes ago. Of note patient with towel between her legs that is ~75% saturated.     OB History     Gravida  3   Para  2   Term  2   Preterm      AB      Living  1      SAB      IAB      Ectopic      Multiple  0   Live Births  1           Past Medical History:  Diagnosis Date   Bronchitis    for last month.  started antibiotic 04/19/17   Family history of breast cancer    Family history of breast cancer    Family history of colon cancer    Family history of kidney cancer    Family history of ovarian cancer    Family history of uterine cancer    Hypothyroidism    Migraine headache    "seems r/t menstrual cycle"   Motion sickness    all vehicles    Past Surgical History:  Procedure Laterality Date   CESAREAN SECTION  04/2010   CESAREAN SECTION N/A 01/10/2015   Procedure: CESAREAN SECTION;  Surgeon: TMelina Schools MD;  [facility];  Service: Obstetrics;  Laterality: N/A;   COLONOSCOPY     COLONOSCOPY WITH PROPOFOL N/A 04/30/2017   Procedure: COLONOSCOPY WITH PROPOFOL;  Surgeon: WLucilla Lame MD;  [place] [facility]  Service: Endoscopy;  Laterality: N/A;   laprascopic     exploratory   OTHER SURGICAL HISTORY  2015   IVF egg retrieval    Family History  Problem Relation Age of Onset    Migraines Mother    Hyperlipidemia Mother    Anxiety disorder Mother 511  Breast cancer Mother 559  Hyperlipidemia Father    Colon cancer Father    Melanoma Father    Kidney cancer Paternal Uncle    Dementia Maternal Grandmother    Breast cancer Maternal Grandmother 547  Endometrial cancer Maternal Grandmother        dx 741s  Stroke Maternal Grandfather    Aneurysm Paternal Grandfather    Alcohol abuse Neg Hx    Arthritis Neg Hx    Asthma Neg Hx    Birth defects Neg Hx    COPD Neg Hx    Depression Neg Hx    Diabetes Neg Hx    Drug abuse Neg Hx    Early death Neg Hx    Hearing loss Neg Hx    Heart disease Neg Hx    Hypertension Neg Hx    Kidney disease Neg Hx    Learning disabilities Neg Hx    Mental illness Neg  Hx    Mental retardation Neg Hx    Miscarriages / Stillbirths Neg Hx    Vision loss Neg Hx    Varicose Veins Neg Hx     Social History   Tobacco Use   Smoking status: Never   Smokeless tobacco: Never  Substance Use Topics   Alcohol use: Yes    Alcohol/week: 4.0 standard drinks of alcohol    Types: 4 Glasses of wine per week    Comment: rarely   Drug use: No    Allergies:  Allergies  Allergen Reactions   Adhesive [Tape] Rash    Some bandaids   Neosporin [Neomycin-Bacitracin Zn-Polymyx] Rash   Sulfa Antibiotics Rash    Medications Prior to Admission  Medication Sig Dispense Refill Last Dose   acetaminophen (TYLENOL) 325 MG tablet Take 2 tablets (650 mg total) by mouth every 4 (four) hours as needed (for pain scale < 4  OR  temperature  >/=  100.5 F). 30 tablet 0    levothyroxine (SYNTHROID) 50 MCG tablet TAKE 1 TABLET BY MOUTH EVERY DAY BEFORE BREAKFAST 30 tablet 0    loratadine (CLARITIN) 10 MG tablet TAKE 1 TABLET BY MOUTH EVERY DAY 90 tablet 3    sertraline (ZOLOFT) 50 MG tablet TAKE 1 AND 1/2 TABLETS DAILY BY MOUTH 135 tablet 1     Review of Systems  Gastrointestinal:  Negative for nausea and vomiting.  Genitourinary:  Positive for vaginal  bleeding. Negative for difficulty urinating, dysuria and vaginal discharge.   Physical Exam   There were no vitals taken for this visit.  Physical Exam Vitals reviewed. Exam conducted with a chaperone present.  Constitutional:      Appearance: Normal appearance.  HENT:     Head: Normocephalic and atraumatic.  Eyes:     Conjunctiva/sclera: Conjunctivae normal.  Cardiovascular:     Rate and Rhythm: Normal rate.  Pulmonary:     Effort: Pulmonary effort is normal. No respiratory distress.  Genitourinary:    Comments: Walnut sized clot removed.  Moderate blood removed with gauze x1.  Kiwi sized clot removed allowing for visualization. Cervix not well visualized d/t placement, but active bleeding not apparent and appears closed.  Musculoskeletal:        General: Normal range of motion.     Cervical back: Normal range of motion.  Skin:    General: Skin is warm and dry.  Neurological:     Mental Status: She is alert and oriented to person, place, and time.  Psychiatric:        Mood and Affect: Mood normal.        Behavior: Behavior normal.     MAU Course  Procedures Results for orders placed or performed during the hospital encounter of 05/18/22 (from the past 24 hour(s))  CBC     Status: Abnormal   Collection Time: 05/18/22  4:06 AM  Result Value Ref Range   WBC 6.6 4.0 - 10.5 K/uL   RBC 3.63 (L) 3.87 - 5.11 MIL/uL   Hemoglobin 11.0 (L) 12.0 - 15.0 g/dL   HCT 32.9 (L) 36.0 - 46.0 %   MCV 90.6 80.0 - 100.0 fL   MCH 30.3 26.0 - 34.0 pg   MCHC 33.4 30.0 - 36.0 g/dL   RDW 12.5 11.5 - 15.5 %   Platelets 265 150 - 400 K/uL   nRBC 0.0 0.0 - 0.2 %  Type and screen [place]     Status: None   Collection Time:  05/18/22  4:06 AM  Result Value Ref Range   ABO/RH(D) A POS    Antibody Screen NEG    Sample Expiration      05/21/2022,2359 Performed at [facility], [place] [address], [place], [place] [address]     MDM Pelvic Exam BSUS Assessment and Plan   41 year old G3P2001 at 21.4 weeks Placenta Previa with Hemorrhage  -POC reviewed with patient and husband. -Pelvic exam performed and findings discussed.  -Patient informed that provider would discuss with attending regarding next steps.  Informed that usually overnight admission is recommended, but considering GA unsure.   Maryann Conners 05/18/2022, 4:01 AM   Reassessment (4:36 AM)  -Dr. Little Ishikawa consulted and informed of patient status, evaluation, interventions, and results. Advised: *Recommend observation for 24-48 hours. -Dr. Londell Moh informed of patient status, evaluation, interventions, results, and recommendation. *Will come to unit to assess and admit.  Maryann Conners MSN, CNM Advanced Practice Provider, [facility]

## 2022-05-19 ENCOUNTER — Encounter (HOSPITAL_COMMUNITY): Payer: Self-pay | Admitting: Obstetrics and Gynecology

## 2022-05-19 DIAGNOSIS — Z3A21 21 weeks gestation of pregnancy: Secondary | ICD-10-CM | POA: Diagnosis not present

## 2022-05-19 DIAGNOSIS — O4692 Antepartum hemorrhage, unspecified, second trimester: Secondary | ICD-10-CM | POA: Diagnosis not present

## 2022-05-19 LAB — TYPE AND SCREEN
ABO/RH(D): A POS
Antibody Screen: NEGATIVE

## 2022-05-19 LAB — FIBRINOGEN: Fibrinogen: 305 mg/dL (ref 210–475)

## 2022-05-20 DIAGNOSIS — O4402 Placenta previa specified as without hemorrhage, second trimester: Secondary | ICD-10-CM | POA: Diagnosis not present

## 2022-05-20 DIAGNOSIS — Z3A21 21 weeks gestation of pregnancy: Secondary | ICD-10-CM | POA: Diagnosis not present

## 2022-05-20 DIAGNOSIS — O4692 Antepartum hemorrhage, unspecified, second trimester: Secondary | ICD-10-CM | POA: Diagnosis not present

## 2022-05-20 LAB — CBC WITH DIFFERENTIAL/PLATELET
Abs Immature Granulocytes: 0.02 10*3/uL (ref 0.00–0.07)
Basophils Absolute: 0.1 10*3/uL (ref 0.0–0.1)
Basophils Relative: 1 %
Eosinophils Absolute: 0.1 10*3/uL (ref 0.0–0.5)
Eosinophils Relative: 1 %
HCT: 24.8 % — ABNORMAL LOW (ref 36.0–46.0)
Hemoglobin: 8.4 g/dL — ABNORMAL LOW (ref 12.0–15.0)
Immature Granulocytes: 0 %
Lymphocytes Relative: 27 %
Lymphs Abs: 1.7 10*3/uL (ref 0.7–4.0)
MCH: 30.3 pg (ref 26.0–34.0)
MCHC: 33.9 g/dL (ref 30.0–36.0)
MCV: 89.5 fL (ref 80.0–100.0)
Monocytes Absolute: 0.5 10*3/uL (ref 0.1–1.0)
Monocytes Relative: 7 %
Neutro Abs: 4 10*3/uL (ref 1.7–7.7)
Neutrophils Relative %: 64 %
Platelets: 207 10*3/uL (ref 150–400)
RBC: 2.77 MIL/uL — ABNORMAL LOW (ref 3.87–5.11)
RDW: 12.6 % (ref 11.5–15.5)
WBC: 6.4 10*3/uL (ref 4.0–10.5)
nRBC: 0 % (ref 0.0–0.2)

## 2022-05-20 LAB — APTT: aPTT: 26 seconds (ref 24–36)

## 2022-05-20 LAB — PROTIME-INR
INR: 1 (ref 0.8–1.2)
Prothrombin Time: 13.3 seconds (ref 11.4–15.2)

## 2022-05-20 LAB — FIBRINOGEN: Fibrinogen: 324 mg/dL (ref 210–475)

## 2022-05-20 MED ORDER — FERROUS GLUCONATE 324 (38 FE) MG PO TABS: 324.0000 mg | ORAL_TABLET | Freq: Two times a day (BID) | ORAL | Status: DC

## 2022-05-20 NOTE — Progress Notes (Signed)
Discharge instructions given to pt. Discussed signs and symptoms to report to the MD, upcoming appointments, and meds. Pt verbalizes understanding and has no questions or concerns at this time. Pt discharged home from hospital in stable condition. 

## 2022-05-20 NOTE — Consult Note (Signed)
MFM Brief Consultation  I connected with  Brandi [name] on 05/20/22 telephonically and verified that I am speaking with the correct person using two identifiers.   I discussed the limitations of evaluation and management by telemedicine. The patient expressed understanding and agreed to proceed.   Brandi [name] is a G3P1 at 59 w 6 d with an EDD of 09/24/22. She is seen at the request of Dr. Ellison Hughs regarding previable bleeding with suspecting placenta previa.   She is recently admitted on Sunday morning due to a large bleed that Brandi [name] would describe as more that a period and would overflow a pad. This bleed occurred at 3 am and by the time they arrived at the hospital the bleeding resolved.  An [place] was performed demonstrating a posterior placenta previa however, the border of placental edge was poorly seen.  While hospitalized Brandi labs remained stable except for anemia Hgb 11 to 8.4. She had not had any further bleeding now 72 hours post event.   She reports having bleeding since early pregnancy with a prior large bleeding event occurring at 10 weeks.  She reports a blood tinged mucous yesterday evening but no further bright red bleeding since Sunday morning.     05/20/2022    8:34 AM 05/19/2022    8:25 PM 05/19/2022    4:24 PM  Vitals with BMI  Systolic 93 109 110  Diastolic 49 62 63  Pulse 75 79 77      Latest Ref Rng & Units 05/20/2022    5:19 AM 05/18/2022    4:06 AM 02/14/2022   11:21 AM  CBC  WBC 4.0 - 10.5 K/uL 6.4  6.6    Hemoglobin 12.0 - 15.0 g/dL 8.4  16.1  09.6   Hematocrit 36.0 - 46.0 % 24.8  32.9  33.6   Platelets 150 - 400 K/uL 207  265        Latest Ref Rng & Units 02/14/2022   10:10 AM 08/18/2019   10:03 AM 08/12/2018   10:29 AM  CMP  Glucose 70 - 99 mg/dL 99  87  83   BUN 6 - 20 mg/dL 5  8  11    Creatinine 0.44 - 1.00 mg/dL 0.45  4.09  8.11   Sodium 135 - 145 mmol/L 134  141  140   Potassium 3.5 - 5.1 mmol/L 3.3  4.5  4.1   Chloride 98 - 111 mmol/L  103  102  99   CO2 22 - 32 mmol/L 23  25  25    Calcium 8.9 - 10.3 mg/dL 9.2  9.4  9.4   Total Protein 6.5 - 8.1 g/dL 7.1  7.0  7.2   Total Bilirubin 0.3 - 1.2 mg/dL 0.6  0.4  0.4   Alkaline Phos 38 - 126 U/L 38  51  49   AST 15 - 41 U/L 18  19  14    ALT 0 - 44 U/L 15  11  9     OB History  Gravida Para Term Preterm AB Living  3 2 2  0 0 1  SAB IAB Ectopic Multiple Live Births  0 0 0 0 1    # Outcome Date GA Lbr Len/2nd Weight Sex Delivery Anes PTL Lv  3 Current           2 Term 01/10/15 [redacted]w[redacted]d 15:04 / 00:34 3060 g F CS-LTranv Spinal  LIV     Apgar1: 9  Apgar5: 9  1 Term 04/25/10  CS-LTranv      Past Medical History:  Diagnosis Date   Bronchitis    for last month.  started antibiotic 04/19/17   Family history of breast cancer    Family history of breast cancer    Family history of colon cancer    Family history of kidney cancer    Family history of ovarian cancer    Family history of uterine cancer    Hypothyroidism    Migraine headache    "seems r/t menstrual cycle"   Motion sickness    all vehicles   Past Surgical History:  Procedure Laterality Date   CESAREAN SECTION  04/2010   CESAREAN SECTION N/A 01/10/2015   Procedure: CESAREAN SECTION;  Surgeon: Bing Plume, MD;  Location: [facility];  Service: Obstetrics;  Laterality: N/A;   COLONOSCOPY     COLONOSCOPY WITH PROPOFOL N/A 04/30/2017   Procedure: COLONOSCOPY WITH PROPOFOL;  Surgeon: Midge Minium, MD;  Location: [facility];  Service: Endoscopy;  Laterality: N/A;   laprascopic     exploratory   OTHER SURGICAL HISTORY  2015   IVF egg retrieval    Social History   Socioeconomic History   Marital status: Married    Spouse name: Not on file   Number of children: Not on file   Years of education: Not on file   Highest education level: Not on file  Occupational History   Not on file  Tobacco Use   Smoking status: Never   Smokeless tobacco: Never  Substance and Sexual Activity   Alcohol use: Yes     Alcohol/week: 4.0 standard drinks of alcohol    Types: 4 Glasses of wine per week    Comment: rarely   Drug use: No   Sexual activity: Yes  Other Topics Concern   Not on file  Social History Narrative   Not on file   Social Determinants of Health   Financial Resource Strain: Not on file  Food Insecurity: Not on file  Transportation Needs: Not on file  Physical Activity: Not on file  Stress: Not on file  Social Connections: Not on file  Intimate Partner Violence: Not on file          Current Facility-Administered Medications (Endocrine & Metabolic):    levothyroxine (SYNTHROID) tablet 50 mcg       Current Facility-Administered Medications (Analgesics):    acetaminophen (TYLENOL) tablet 650 mg   Current Facility-Administered Medications (Hematological):    ferrous gluconate (FERGON) tablet 324 mg   Current Facility-Administered Medications (Other):    calcium carbonate (TUMS - dosed in mg elemental calcium) chewable tablet 400 mg of elemental calcium   docusate sodium (COLACE) capsule 100 mg   prenatal multivitamin tablet 1 tablet   sertraline (ZOLOFT) tablet 50 mg   zolpidem (AMBIEN) tablet 5 mg  No current outpatient medications on file. Allergies  Allergen Reactions   Adhesive [Tape] Rash    Some bandaids   Neosporin [Neomycin-Bacitracin Zn-Polymyx] Rash   Sulfa Antibiotics Rash   Impression/Counseling:  I discussed with Brandi [name] and Brandi [name] telephonically the diagnosis, evaluation and management of placenta previa with bleeding.  I discussed that the ultrasound evaluation was limited but does suggest a placenta previa and may be one that if stable may resolve in the third trimester.  I discussed that she is at a previable gesational age and the cut off for possible intervention is 22 w 6 days. However, at which time we would offer betamethasone to improve neonatal  outcomes however, at this gestational age outcomes are poor.  Therefore, if she  bleeds again after this time we would observe Brandi and offer BMZ and a NICU consultation. Delivery would be determined based on maternal and fetal status.   Secondly, I discussed that placenta previa requires a cesarean delivery.   Thirdly, we discussed that this admission would be considered Brandi first bleed and that if a second bleed presented should would remain in the hospital for 5 days after the last day of bleeding. If a third bleed occurred and the placenta previa had not resolve she would likely remain hospitalized for the remainder of the pregnancy and/or until the placenta previa resolved. IF while hospitalized and another bleed occurred delivery should be considered if >[redacted] weeks gestation.    Otherwise consider delivery between 35-37 weeks.  Lastly, I have contacted our schedulers for a follow up ultrasound and MFM Consultation in our offices within 1-2 weeks.  Brandi [name] anemia should be followed and treated with iron supplementation.  I discussed this plan of care with Dr. Claiborne Billings and Dr. Reina Fuse.  All questions answered.  I spent 45 minutes with > 50% in direct non-face to face communication with Brandi [name], Brandi providers and care coordination.   Novella Olive, MD

## 2022-05-23 ENCOUNTER — Other Ambulatory Visit: Payer: Self-pay | Admitting: Obstetrics and Gynecology

## 2022-05-23 DIAGNOSIS — Z363 Encounter for antenatal screening for malformations: Secondary | ICD-10-CM

## 2022-06-02 ENCOUNTER — Ambulatory Visit: Payer: BC Managed Care – PPO | Attending: Obstetrics and Gynecology

## 2022-06-02 ENCOUNTER — Other Ambulatory Visit: Payer: Self-pay | Admitting: Obstetrics and Gynecology

## 2022-06-02 ENCOUNTER — Ambulatory Visit: Payer: BC Managed Care – PPO | Admitting: *Deleted

## 2022-06-02 ENCOUNTER — Ambulatory Visit (HOSPITAL_BASED_OUTPATIENT_CLINIC_OR_DEPARTMENT_OTHER): Payer: BC Managed Care – PPO | Admitting: Maternal & Fetal Medicine

## 2022-06-02 VITALS — BP 106/56 | HR 76

## 2022-06-02 DIAGNOSIS — Z3A23 23 weeks gestation of pregnancy: Secondary | ICD-10-CM | POA: Diagnosis not present

## 2022-06-02 DIAGNOSIS — O09522 Supervision of elderly multigravida, second trimester: Secondary | ICD-10-CM | POA: Diagnosis not present

## 2022-06-02 DIAGNOSIS — O4402 Placenta previa specified as without hemorrhage, second trimester: Secondary | ICD-10-CM | POA: Insufficient documentation

## 2022-06-02 DIAGNOSIS — E039 Hypothyroidism, unspecified: Secondary | ICD-10-CM

## 2022-06-02 DIAGNOSIS — Z363 Encounter for antenatal screening for malformations: Secondary | ICD-10-CM

## 2022-06-02 DIAGNOSIS — O99282 Endocrine, nutritional and metabolic diseases complicating pregnancy, second trimester: Secondary | ICD-10-CM

## 2022-06-02 DIAGNOSIS — O34219 Maternal care for unspecified type scar from previous cesarean delivery: Secondary | ICD-10-CM | POA: Diagnosis not present

## 2022-06-02 NOTE — Progress Notes (Signed)
Very scant amount of brown spotting.

## 2022-06-03 ENCOUNTER — Other Ambulatory Visit: Payer: Self-pay | Admitting: *Deleted

## 2022-06-03 DIAGNOSIS — O4402 Placenta previa specified as without hemorrhage, second trimester: Secondary | ICD-10-CM

## 2022-06-03 DIAGNOSIS — O09522 Supervision of elderly multigravida, second trimester: Secondary | ICD-10-CM

## 2022-06-03 NOTE — Progress Notes (Signed)
MFM Brief Note   Brandi [name] is a 41 yo G3P1 who is seen today at 23w 5d in follow up consultation regarding a known placenta previa.  She seen at the request of Dr. Eula Flax.  Brandi [name] was previously seen on 06/27 for a large bleed and discovered to have a placenta that wasn't well characterized.  She is seen in office today having had no subsequent bleed for a transvaginal ultrasound to assess the placenta location.  Today we observed a single intrauterine pregnancy with measurements consistent with dates. Good fetal movement and amniotic fluid was observed.  We observed a posterior placenta previa that covered the cervix. This was nearly a central previa with a significant amount of lacunae near the cervix. There was clear distinction of the posterior uterine wall and the placental edge. However, I discussed that there is an increased risk for placenta accreta given todays findings but this is less likely given the posterior nature of the placenta.  We will continue to monitor the placental location and utero-placental edge every 4 weeks and will consider an pelvic MRI as we get closer to the third trimester.  We discussed that there was a large vessel covering the cervical os that is a primary candidate for tearing if labor or trauma occurred.  Thus if bleeding again occurred I would recommend remaining hospitalized.   If delivery is required preparation for hysterectomy is recommended.   We also discussed possible administration of BMZ between 24-26 weeks as I believe the risk for bleeding prior to 32 weeks is high.   Bleeding precautions and going to the nearest hospital if bleeding occurred is recommend for Brandi [name] she lives near [facility].  I suggested that stabilization could occur there and transfer to [facility], but driving to [place] in the midst of bleeding would not be wise.  All questions answered. I discussed this information with Brandi [name] as well who was  also present during to today's consultation.  Lastly if stable I recommend delivery between 35-37 weeks  I spent 30 minutes with > 50% in face to face consultation.  Brandi [name] J.Toria Monte, MD

## 2022-06-07 ENCOUNTER — Other Ambulatory Visit: Payer: Self-pay | Admitting: Family Medicine

## 2022-06-10 ENCOUNTER — Other Ambulatory Visit: Payer: Self-pay

## 2022-06-10 ENCOUNTER — Ambulatory Visit (INDEPENDENT_AMBULATORY_CARE_PROVIDER_SITE_OTHER): Payer: BC Managed Care – PPO

## 2022-06-10 VITALS — BP 115/72 | HR 89 | Wt 154.3 lb

## 2022-06-10 DIAGNOSIS — O09892 Supervision of other high risk pregnancies, second trimester: Secondary | ICD-10-CM

## 2022-06-10 DIAGNOSIS — Z3A24 24 weeks gestation of pregnancy: Secondary | ICD-10-CM | POA: Diagnosis not present

## 2022-06-10 MED ORDER — BETAMETHASONE SOD PHOS & ACET 6 (3-3) MG/ML IJ SUSP
12.0000 mg | INTRAMUSCULAR | Status: AC
  Administered 2022-06-10 – 2022-06-11 (×2): 12 mg via INTRAMUSCULAR

## 2022-06-10 NOTE — Progress Notes (Addendum)
Brandi [name] is here today to receive Betamethasone injection. Patient is [redacted]w[redacted]d Injection administered without issue. Patient to return in 24 hours for second Betamethasone injection. Patient verbalized understanding.   MPaulina Fusi RN 06/10/22

## 2022-06-11 ENCOUNTER — Ambulatory Visit (INDEPENDENT_AMBULATORY_CARE_PROVIDER_SITE_OTHER): Payer: BC Managed Care – PPO

## 2022-06-11 DIAGNOSIS — Z3A25 25 weeks gestation of pregnancy: Secondary | ICD-10-CM

## 2022-06-11 DIAGNOSIS — O09892 Supervision of other high risk pregnancies, second trimester: Secondary | ICD-10-CM | POA: Diagnosis not present

## 2022-06-11 NOTE — Progress Notes (Signed)
Brandi [name] is here today to receive her second injection of Betamethasone. Patient is 6w0dpregnant. Injection administered today without issue. [place] Patient denies any other questions.   MPaulina Fusi RN 06/11/22

## 2022-06-22 ENCOUNTER — Other Ambulatory Visit: Payer: Self-pay | Admitting: Family Medicine

## 2022-06-23 ENCOUNTER — Telehealth: Payer: Self-pay

## 2022-06-23 NOTE — Telephone Encounter (Signed)
Pt called reporting that she is completely out of her current supply, needs refill today

## 2022-06-23 NOTE — Telephone Encounter (Unsigned)
Copied from [place] [id number]. Topic: Appointment Scheduling - Scheduling Inquiry for Clinic >> Jun 23, 2022  1:52 PM Sabas Sous wrote: Reason for CRM: Pt is due to have her baby on 08/27/2022, wants to know if she can have her CPE anytime prior to this date? Please advise, reschedule request

## 2022-06-24 NOTE — Telephone Encounter (Signed)
Requested medication (s) are due for refill today: yes  Requested medication (s) are on the active medication list: yes  Last refill:  05/09/22  Future visit scheduled: yes  Notes to clinic:  Unable to refill per protocol due to failed labs, no updated results.  Pt needs TSH lab work, routing for approval.     Requested Prescriptions  Pending Prescriptions Disp Refills   levothyroxine (SYNTHROID) 50 MCG tablet [Pharmacy Med Name: LEVOTHYROXINE 50 MCG TABLET] 30 tablet 0    Sig: TAKE 1 TABLET BY MOUTH EVERY DAY BEFORE BREAKFAST     Endocrinology:  Hypothyroid Agents Failed - 06/23/2022  1:51 PM      Failed - TSH in normal range and within 360 days    TSH  Date Value Ref Range Status  08/18/2019 1.720 0.450 - 4.500 uIU/mL Final         Passed - Valid encounter within last 12 months    Recent Outpatient Visits           9 months ago Annual physical exam   [facility] Jerrol Banana., MD   1 year ago Constipation, unspecified constipation type   [place], Vickki Muff, [place]   1 year ago Acquired hypothyroidism   [facility] Jerrol Banana., MD   2 years ago Constipation, unspecified constipation type   [facility] Jerrol Banana., MD   2 years ago Annual physical exam   [facility] Jerrol Banana., MD       Future Appointments             In 2 weeks Jerrol Banana., MD [facility], [place]   In 2 months Jerrol Banana., MD [facility], [place]

## 2022-06-24 NOTE — Telephone Encounter (Signed)
LMOVM advising patient that we have an appt available on 07/10/2022 at 1:20 pm. Ok for pec to advise pt. Thanks.

## 2022-06-24 NOTE — Telephone Encounter (Signed)
Requested medication (s) are due for refill today: yes  Requested medication (s) are on the active medication list: yes  Last refill:  05/09/22  Future visit scheduled: yes  Notes to clinic:  Unable to refill per protocol due to failed labs, no updated results.      Requested Prescriptions  Pending Prescriptions Disp Refills   levothyroxine (SYNTHROID) 50 MCG tablet [Pharmacy Med Name: LEVOTHYROXINE 50 MCG TABLET] 30 tablet 0    Sig: TAKE 1 TABLET BY MOUTH EVERY DAY BEFORE BREAKFAST     Endocrinology:  Hypothyroid Agents Failed - 06/23/2022  1:51 PM      Failed - TSH in normal range and within 360 days    TSH  Date Value Ref Range Status  08/18/2019 1.720 0.450 - 4.500 uIU/mL Final         Passed - Valid encounter within last 12 months    Recent Outpatient Visits           9 months ago Annual physical exam   [facility] Jerrol Banana., MD   1 year ago Constipation, unspecified constipation type   [place], Vickki Muff, [place]   1 year ago Acquired hypothyroidism   [facility] Jerrol Banana., MD   2 years ago Constipation, unspecified constipation type   [facility] Jerrol Banana., MD   2 years ago Annual physical exam   [facility] Jerrol Banana., MD       Future Appointments             In 2 weeks Jerrol Banana., MD [facility], [place]   In 2 months Jerrol Banana., MD [facility], [place]

## 2022-07-01 ENCOUNTER — Ambulatory Visit (HOSPITAL_BASED_OUTPATIENT_CLINIC_OR_DEPARTMENT_OTHER): Payer: BC Managed Care – PPO

## 2022-07-01 ENCOUNTER — Ambulatory Visit: Payer: BC Managed Care – PPO | Admitting: *Deleted

## 2022-07-01 ENCOUNTER — Other Ambulatory Visit: Payer: Self-pay | Admitting: *Deleted

## 2022-07-01 VITALS — BP 113/66 | HR 83

## 2022-07-01 DIAGNOSIS — Z0542 Observation and evaluation of newborn for suspected metabolic condition ruled out: Secondary | ICD-10-CM | POA: Diagnosis not present

## 2022-07-01 DIAGNOSIS — N1330 Unspecified hydronephrosis: Secondary | ICD-10-CM | POA: Diagnosis not present

## 2022-07-01 DIAGNOSIS — O99344 Other mental disorders complicating childbirth: Secondary | ICD-10-CM | POA: Diagnosis not present

## 2022-07-01 DIAGNOSIS — O43893 Other placental disorders, third trimester: Secondary | ICD-10-CM | POA: Diagnosis not present

## 2022-07-01 DIAGNOSIS — O09523 Supervision of elderly multigravida, third trimester: Secondary | ICD-10-CM | POA: Diagnosis not present

## 2022-07-01 DIAGNOSIS — O43213 Placenta accreta, third trimester: Secondary | ICD-10-CM | POA: Diagnosis not present

## 2022-07-01 DIAGNOSIS — F419 Anxiety disorder, unspecified: Secondary | ICD-10-CM | POA: Diagnosis not present

## 2022-07-01 DIAGNOSIS — O09522 Supervision of elderly multigravida, second trimester: Secondary | ICD-10-CM | POA: Insufficient documentation

## 2022-07-01 DIAGNOSIS — O42913 Preterm premature rupture of membranes, unspecified as to length of time between rupture and onset of labor, third trimester: Secondary | ICD-10-CM | POA: Diagnosis not present

## 2022-07-01 DIAGNOSIS — O24420 Gestational diabetes mellitus in childbirth, diet controlled: Secondary | ICD-10-CM | POA: Diagnosis not present

## 2022-07-01 DIAGNOSIS — O34211 Maternal care for low transverse scar from previous cesarean delivery: Secondary | ICD-10-CM | POA: Diagnosis not present

## 2022-07-01 DIAGNOSIS — O99282 Endocrine, nutritional and metabolic diseases complicating pregnancy, second trimester: Secondary | ICD-10-CM

## 2022-07-01 DIAGNOSIS — Z90710 Acquired absence of both cervix and uterus: Secondary | ICD-10-CM | POA: Diagnosis not present

## 2022-07-01 DIAGNOSIS — Z3A29 29 weeks gestation of pregnancy: Secondary | ICD-10-CM | POA: Diagnosis not present

## 2022-07-01 DIAGNOSIS — O99891 Other specified diseases and conditions complicating pregnancy: Secondary | ICD-10-CM | POA: Diagnosis not present

## 2022-07-01 DIAGNOSIS — Z3A27 27 weeks gestation of pregnancy: Secondary | ICD-10-CM

## 2022-07-01 DIAGNOSIS — Z3A32 32 weeks gestation of pregnancy: Secondary | ICD-10-CM | POA: Diagnosis not present

## 2022-07-01 DIAGNOSIS — O99284 Endocrine, nutritional and metabolic diseases complicating childbirth: Secondary | ICD-10-CM | POA: Diagnosis not present

## 2022-07-01 DIAGNOSIS — E039 Hypothyroidism, unspecified: Secondary | ICD-10-CM

## 2022-07-01 DIAGNOSIS — O4693 Antepartum hemorrhage, unspecified, third trimester: Secondary | ICD-10-CM | POA: Diagnosis not present

## 2022-07-01 DIAGNOSIS — O4402 Placenta previa specified as without hemorrhage, second trimester: Secondary | ICD-10-CM | POA: Diagnosis not present

## 2022-07-01 DIAGNOSIS — O9902 Anemia complicating childbirth: Secondary | ICD-10-CM | POA: Diagnosis not present

## 2022-07-01 DIAGNOSIS — O99283 Endocrine, nutritional and metabolic diseases complicating pregnancy, third trimester: Secondary | ICD-10-CM | POA: Diagnosis not present

## 2022-07-01 DIAGNOSIS — O4692 Antepartum hemorrhage, unspecified, second trimester: Secondary | ICD-10-CM

## 2022-07-01 DIAGNOSIS — Z3A30 30 weeks gestation of pregnancy: Secondary | ICD-10-CM | POA: Diagnosis not present

## 2022-07-01 DIAGNOSIS — O283 Abnormal ultrasonic finding on antenatal screening of mother: Secondary | ICD-10-CM

## 2022-07-01 DIAGNOSIS — O4292 Full-term premature rupture of membranes, unspecified as to length of time between rupture and onset of labor: Secondary | ICD-10-CM | POA: Diagnosis not present

## 2022-07-01 DIAGNOSIS — O365930 Maternal care for other known or suspected poor fetal growth, third trimester, not applicable or unspecified: Secondary | ICD-10-CM | POA: Diagnosis not present

## 2022-07-01 DIAGNOSIS — O4412 Placenta previa with hemorrhage, second trimester: Secondary | ICD-10-CM | POA: Diagnosis not present

## 2022-07-01 DIAGNOSIS — O34219 Maternal care for unspecified type scar from previous cesarean delivery: Secondary | ICD-10-CM

## 2022-07-01 DIAGNOSIS — O4403 Placenta previa specified as without hemorrhage, third trimester: Secondary | ICD-10-CM | POA: Diagnosis not present

## 2022-07-01 DIAGNOSIS — O43212 Placenta accreta, second trimester: Secondary | ICD-10-CM | POA: Diagnosis not present

## 2022-07-01 DIAGNOSIS — Z23 Encounter for immunization: Secondary | ICD-10-CM | POA: Diagnosis not present

## 2022-07-01 DIAGNOSIS — Z3A31 31 weeks gestation of pregnancy: Secondary | ICD-10-CM | POA: Diagnosis not present

## 2022-07-01 DIAGNOSIS — O4413 Placenta previa with hemorrhage, third trimester: Secondary | ICD-10-CM | POA: Diagnosis not present

## 2022-07-01 DIAGNOSIS — Z79899 Other long term (current) drug therapy: Secondary | ICD-10-CM | POA: Diagnosis not present

## 2022-07-01 DIAGNOSIS — Z3A28 28 weeks gestation of pregnancy: Secondary | ICD-10-CM | POA: Diagnosis not present

## 2022-07-03 ENCOUNTER — Other Ambulatory Visit: Payer: Self-pay

## 2022-07-03 ENCOUNTER — Observation Stay
Admission: EM | Admit: 2022-07-03 | Discharge: 2022-07-04 | Disposition: A | Discharge status: Other Acute Inpatient Hospital | Payer: BC Managed Care – PPO | Attending: Certified Nurse Midwife | Admitting: Certified Nurse Midwife

## 2022-07-03 ENCOUNTER — Encounter: Payer: Self-pay | Admitting: Obstetrics and Gynecology

## 2022-07-03 DIAGNOSIS — O99891 Other specified diseases and conditions complicating pregnancy: Secondary | ICD-10-CM | POA: Diagnosis not present

## 2022-07-03 DIAGNOSIS — O4411 Placenta previa with hemorrhage, first trimester: Secondary | ICD-10-CM | POA: Diagnosis present

## 2022-07-03 DIAGNOSIS — E039 Hypothyroidism, unspecified: Secondary | ICD-10-CM | POA: Insufficient documentation

## 2022-07-03 DIAGNOSIS — O4412 Placenta previa with hemorrhage, second trimester: Principal | ICD-10-CM | POA: Insufficient documentation

## 2022-07-03 DIAGNOSIS — Z79899 Other long term (current) drug therapy: Secondary | ICD-10-CM | POA: Diagnosis not present

## 2022-07-03 DIAGNOSIS — O09522 Supervision of elderly multigravida, second trimester: Secondary | ICD-10-CM | POA: Insufficient documentation

## 2022-07-03 DIAGNOSIS — Z3A28 28 weeks gestation of pregnancy: Secondary | ICD-10-CM | POA: Diagnosis not present

## 2022-07-03 LAB — CBC
HCT: 28.5 % — ABNORMAL LOW (ref 36.0–46.0)
Hemoglobin: 9.7 g/dL — ABNORMAL LOW (ref 12.0–15.0)
MCH: 30.5 pg (ref 26.0–34.0)
MCHC: 34 g/dL (ref 30.0–36.0)
MCV: 89.6 fL (ref 80.0–100.0)
Platelets: 281 10*3/uL (ref 150–400)
RBC: 3.18 MIL/uL — ABNORMAL LOW (ref 3.87–5.11)
RDW: 12.9 % (ref 11.5–15.5)
WBC: 6.1 10*3/uL (ref 4.0–10.5)
nRBC: 0 % (ref 0.0–0.2)

## 2022-07-03 LAB — TYPE AND SCREEN
ABO/RH(D): A POS
Antibody Screen: NEGATIVE

## 2022-07-03 MED ORDER — LACTATED RINGERS IV SOLN
INTRAVENOUS | Status: DC
Start: 2022-07-03 — End: 2022-07-04

## 2022-07-03 NOTE — H&P (Signed)
OB History & Physical   History of Present Illness:  Chief Complaint: vaginal bleeding  HPI:  Brandi [name] is a 41 y.o. G55P2002 female at 15w1ddated by other.  Her pregnancy has been complicated by AMA, hypothyroid, history of cesarean section, anxiety disorder, placenta previa, vaginal bleeding .    She reports fetal movement. She denies pain. She denies contractions.   She denies leakage of fluid.   She reports vaginal bleeding that began at 3:15 PM today. This is her third episode of bleeding during this pregnancy. She has been receiving care from MFM.  The following is from Dr BShon Baton7/10/23 note:   "We observed a posterior placenta previa that covered the cervix. This was nearly a central previa with a significant amount of lacunae near the cervix. There was clear distinction of the posterior uterine wall and the placental edge. However, I discussed that there is an increased risk for placenta accreta given todays findings but this is less likely given the posterior nature of the placenta.   We will continue to monitor the placental location and utero-placental edge every 4 weeks and will consider an pelvic MRI as we get closer to the third trimester.   We discussed that there was a large vessel covering the cervical os that is a primary candidate for tearing if labor or trauma occurred.  Thus if bleeding again occurred I would recommend remaining hospitalized.    If delivery is required preparation for hysterectomy is recommended. (Increased risk for accreta)   We also discussed possible administration of BMZ between 24-26 weeks as I believe the risk for bleeding prior to 32 weeks is high.    Bleeding precautions and going to the nearest hospital if bleeding occurred is recommend [facility]  I suggested that stabilization could occur [facility] [place]."   Total weight  gain for pregnancy: 7.394 kg    Maternal Medical History:   Past Medical History:  Diagnosis Date   Bronchitis    for last month.  started antibiotic 04/19/17   Family history of breast cancer    Family history of breast cancer    Family history of colon cancer    Family history of kidney cancer    Family history of ovarian cancer    Family history of uterine cancer    Hypothyroidism    Migraine headache    "seems r/t menstrual cycle"   Motion sickness    all vehicles    Past Surgical History:  Procedure Laterality Date   CESAREAN SECTION  04/2010   CESAREAN SECTION N/A 01/10/2015   Procedure: CESAREAN SECTION;  Surgeon: TMelina Schools MD;  [facility];  Service: Obstetrics;  Laterality: N/A;   COLONOSCOPY     COLONOSCOPY WITH PROPOFOL N/A 04/30/2017   Procedure: COLONOSCOPY WITH PROPOFOL;  Surgeon: WLucilla Lame MD;  [place]  Service: Endoscopy;  Laterality: N/A;   laprascopic     exploratory   OTHER SURGICAL HISTORY  2015   IVF egg retrieval    Allergies  Allergen Reactions   Adhesive [Tape] Rash    Some bandaids   Neosporin [Neomycin-Bacitracin Zn-Polymyx] Rash   Sulfa Antibiotics Rash    Prior to Admission medications   Medication Sig Start Date End Date Taking? Authorizing Provider  acetaminophen (TYLENOL) 325 MG tablet Take 2 tablets (650 mg total) by mouth every  4 (four) hours as needed (for pain scale < 4  OR  temperature  >/=  100.5 F). 02/15/22   Paula Compton, MD  ferrous gluconate (FERGON) 324 MG tablet Take 324 mg by mouth daily with breakfast.    [provider]  levothyroxine (SYNTHROID) 50 MCG tablet TAKE 1 TABLET BY MOUTH EVERY DAY BEFORE BREAKFAST 06/24/22   Jerrol Banana., MD  loratadine (CLARITIN) 10 MG tablet TAKE 1 TABLET BY MOUTH EVERY DAY Patient not taking: Reported on 06/10/2022 10/08/21   Jerrol Banana., MD  Prenatal Vit-Fe Fumarate-FA (PRENATAL VITAMINS PO) Take by mouth.    [provider]  sertraline (ZOLOFT) 50 MG tablet TAKE 1 AND 1/2 TABLETS DAILY BY MOUTH Patient taking differently: Take 25 mg by mouth daily. Taking '25mg'$  for the past 4 days in row now as she weans 05/08/22   Jerrol Banana., MD    OB History  Gravida Para Term Preterm AB Living  '3 2 2     2  '$ SAB IAB Ectopic Multiple Live Births        0 2    # Outcome Date GA Lbr Len/2nd Weight Sex Delivery Anes PTL Lv  3 Current           2 Term 01/10/15 37w5d15:04 / 00:34 3060 g F CS-LTranv Spinal  LIV  1 Term 04/25/10     CS-LTranv       [place] She  reports that she has never smoked. She has never used smokeless tobacco. She reports that she does not currently use alcohol after a past usage of about 4.0 standard drinks of alcohol per week. She reports that she does not use drugs.  Family History: family history includes Aneurysm in her paternal grandfather; Anxiety disorder (age of onset: 580 in her mother; Breast cancer (age of onset: 538 in her maternal grandmother and mother; Colon cancer in her father; Dementia in her maternal grandmother; Endometrial cancer in her maternal grandmother; Hyperlipidemia in her father and mother; Kidney cancer in her paternal uncle; Melanoma in her father; Migraines in her mother; Stroke in her maternal grandfather.    Review of Systems:  Review of Systems  Constitutional:  Negative for chills and fever.  HENT:  Negative for congestion, ear discharge, ear pain, hearing loss, sinus pain and sore throat.   Eyes:  Negative for blurred vision and double vision.  Respiratory:  Negative for cough, shortness of breath and wheezing.   Cardiovascular:  Negative for chest pain, palpitations and leg swelling.  Gastrointestinal:  Negative for abdominal pain, blood in stool, constipation, diarrhea, heartburn, melena, nausea and vomiting.  Genitourinary:  Negative for dysuria, flank pain, frequency, hematuria and urgency.        Positive for vaginal bleeding  Musculoskeletal:  Negative for back pain, joint pain and myalgias.  Skin:  Negative for itching and rash.  Neurological:  Negative for dizziness, tingling, tremors, sensory change, speech change, focal weakness, seizures, loss of consciousness, weakness and headaches.  Endo/Heme/Allergies:  Negative for environmental allergies. Does not bruise/bleed easily.  Psychiatric/Behavioral:  Negative for depression, hallucinations, memory loss, substance abuse and suicidal ideas. The patient is nervous/anxious. The patient does not have insomnia.      Physical Exam:  BP 105/68   Pulse 89   Temp 98.4 F (36.9 C) (Oral)   Resp 20   Ht '5\' 6"'$  (1.676 m)   Wt 71.4 kg  SpO2 99%   BMI 25.39 kg/m   Constitutional: Well nourished, well developed female in acute emotional distress.  HEENT: normal Skin: Warm and dry.  Cardiovascular: Regular rate and rhythm.   Extremity:  no edema   Respiratory: Clear to auscultation bilateral. Normal respiratory effort Abdomen: FHT present Back: no CVAT Neuro: DTRs 2+, Cranial nerves grossly intact Psych: Alert and Oriented x3. No memory deficits. Anxious/nervous   Pelvic exam: deferred, bright red blood seen on pad; 40 ml qbl and an additional 50 ebl on cloth patient was using from home   Baseline FHR: 135 beats/min   Variability: moderate   Accelerations: present   Decelerations: absent Contractions: absent frequency: absent Overall assessment: reassuring   Lab Results  Component Value Date   [place] Not Detected 07/06/2019    Assessment:  Kary Sugrue is a 41 y.o. G19P2002 female at 69w1dwith placenta previa, episode of acute vaginal bleeding.   Plan:  Admit to Labor & Delivery for observation Continuous monitoring/assess for vaginal bleeding/no vaginal exams CBC, T&S, IVF May have light meal Fetal well-being: Category I S/P course of betamethasone; consider additional dose if bleeding  worsens [facility] BCoralee Rud CNM 07/03/2022 9:07 PM

## 2022-07-03 NOTE — OB Triage Note (Signed)
Pt reports vaginal bleeding since 1515 and reports a placenta previa. Reports +FM, denies ctx, LOF. Initial FHT 150.

## 2022-07-04 ENCOUNTER — Encounter (HOSPITAL_COMMUNITY): Payer: Self-pay | Admitting: Obstetrics and Gynecology

## 2022-07-04 ENCOUNTER — Inpatient Hospital Stay (HOSPITAL_COMMUNITY)
Admission: AD | Admit: 2022-07-04 | Discharge: 2022-08-05 | DRG: 787 | Disposition: A | Discharge status: Home / Self Care | Payer: BC Managed Care – PPO | Source: Ambulatory Visit | Attending: Obstetrics and Gynecology | Admitting: Obstetrics and Gynecology

## 2022-07-04 DIAGNOSIS — O99891 Other specified diseases and conditions complicating pregnancy: Secondary | ICD-10-CM | POA: Diagnosis not present

## 2022-07-04 DIAGNOSIS — O09522 Supervision of elderly multigravida, second trimester: Secondary | ICD-10-CM | POA: Diagnosis not present

## 2022-07-04 DIAGNOSIS — O9902 Anemia complicating childbirth: Secondary | ICD-10-CM | POA: Diagnosis present

## 2022-07-04 DIAGNOSIS — Z3A28 28 weeks gestation of pregnancy: Secondary | ICD-10-CM | POA: Diagnosis not present

## 2022-07-04 DIAGNOSIS — O24420 Gestational diabetes mellitus in childbirth, diet controlled: Secondary | ICD-10-CM | POA: Diagnosis present

## 2022-07-04 DIAGNOSIS — O09523 Supervision of elderly multigravida, third trimester: Secondary | ICD-10-CM | POA: Diagnosis not present

## 2022-07-04 DIAGNOSIS — Z23 Encounter for immunization: Secondary | ICD-10-CM | POA: Diagnosis not present

## 2022-07-04 DIAGNOSIS — O43213 Placenta accreta, third trimester: Secondary | ICD-10-CM | POA: Diagnosis present

## 2022-07-04 DIAGNOSIS — F419 Anxiety disorder, unspecified: Secondary | ICD-10-CM | POA: Diagnosis present

## 2022-07-04 DIAGNOSIS — O99284 Endocrine, nutritional and metabolic diseases complicating childbirth: Secondary | ICD-10-CM | POA: Diagnosis present

## 2022-07-04 DIAGNOSIS — Z90710 Acquired absence of both cervix and uterus: Principal | ICD-10-CM | POA: Diagnosis present

## 2022-07-04 DIAGNOSIS — O99344 Other mental disorders complicating childbirth: Secondary | ICD-10-CM | POA: Diagnosis present

## 2022-07-04 DIAGNOSIS — Z3A32 32 weeks gestation of pregnancy: Secondary | ICD-10-CM | POA: Diagnosis not present

## 2022-07-04 DIAGNOSIS — O4693 Antepartum hemorrhage, unspecified, third trimester: Secondary | ICD-10-CM | POA: Diagnosis not present

## 2022-07-04 DIAGNOSIS — O42913 Preterm premature rupture of membranes, unspecified as to length of time between rupture and onset of labor, third trimester: Secondary | ICD-10-CM | POA: Diagnosis present

## 2022-07-04 DIAGNOSIS — Z79899 Other long term (current) drug therapy: Secondary | ICD-10-CM | POA: Diagnosis not present

## 2022-07-04 DIAGNOSIS — O34211 Maternal care for low transverse scar from previous cesarean delivery: Secondary | ICD-10-CM | POA: Diagnosis present

## 2022-07-04 DIAGNOSIS — O4413 Placenta previa with hemorrhage, third trimester: Secondary | ICD-10-CM | POA: Diagnosis present

## 2022-07-04 DIAGNOSIS — O4412 Placenta previa with hemorrhage, second trimester: Secondary | ICD-10-CM | POA: Diagnosis not present

## 2022-07-04 DIAGNOSIS — Z3A29 29 weeks gestation of pregnancy: Secondary | ICD-10-CM | POA: Diagnosis not present

## 2022-07-04 DIAGNOSIS — O34219 Maternal care for unspecified type scar from previous cesarean delivery: Secondary | ICD-10-CM | POA: Diagnosis not present

## 2022-07-04 DIAGNOSIS — E039 Hypothyroidism, unspecified: Secondary | ICD-10-CM | POA: Diagnosis present

## 2022-07-04 DIAGNOSIS — O99283 Endocrine, nutritional and metabolic diseases complicating pregnancy, third trimester: Secondary | ICD-10-CM | POA: Diagnosis not present

## 2022-07-04 LAB — TYPE AND SCREEN
ABO/RH(D): A POS
Antibody Screen: NEGATIVE

## 2022-07-04 LAB — RPR: RPR Ser Ql: NONREACTIVE

## 2022-07-04 MED ORDER — ACETAMINOPHEN 325 MG PO TABS
650.0000 mg | ORAL_TABLET | ORAL | Status: DC | PRN
  Administered 2022-07-29: 650 mg via ORAL
  Filled 2022-07-04: qty 2

## 2022-07-04 MED ORDER — SODIUM CHLORIDE 0.9% FLUSH
3.0000 mL | Freq: Two times a day (BID) | INTRAVENOUS | Status: DC
Start: 2022-07-04 — End: 2022-07-14
  Administered 2022-07-05 – 2022-07-12 (×14): 3 mL via INTRAVENOUS

## 2022-07-04 MED ORDER — LEVOTHYROXINE SODIUM 50 MCG PO TABS
50.0000 ug | ORAL_TABLET | Freq: Every day | ORAL | Status: DC
  Administered 2022-07-04: 50 ug via ORAL
  Filled 2022-07-04: qty 1

## 2022-07-04 MED ORDER — SODIUM CHLORIDE 0.9 % IV SOLN
250.0000 mL | INTRAVENOUS | Status: DC | PRN
  Administered 2022-07-07: 250 mL via INTRAVENOUS

## 2022-07-04 MED ORDER — PRENATAL MULTIVITAMIN CH
1.0000 | ORAL_TABLET | Freq: Every day | ORAL | Status: DC
Start: 2022-07-04 — End: 2022-07-14
  Administered 2022-07-04 – 2022-07-14 (×11): 1 via ORAL
  Filled 2022-07-04 (×11): qty 1

## 2022-07-04 MED ORDER — PRENATAL MULTIVITAMIN CH
1.0000 | ORAL_TABLET | Freq: Every day | ORAL | Status: DC
  Filled 2022-07-04: qty 1

## 2022-07-04 MED ORDER — CALCIUM CARBONATE ANTACID 500 MG PO CHEW
2.0000 | CHEWABLE_TABLET | ORAL | Status: DC | PRN
  Administered 2022-07-04 – 2022-07-30 (×18): 400 mg via ORAL
  Filled 2022-07-04 (×18): qty 2

## 2022-07-04 MED ORDER — SERTRALINE HCL 25 MG PO TABS
25.0000 mg | ORAL_TABLET | Freq: Every day | ORAL | Status: DC
  Administered 2022-07-04: 25 mg via ORAL
  Filled 2022-07-04: qty 1

## 2022-07-04 MED ORDER — DOCUSATE SODIUM 100 MG PO CAPS
100.0000 mg | ORAL_CAPSULE | Freq: Every day | ORAL | Status: DC
  Administered 2022-07-07 – 2022-07-14 (×8): 100 mg via ORAL
  Filled 2022-07-04 (×11): qty 1

## 2022-07-04 MED ORDER — SODIUM CHLORIDE 0.9% FLUSH: 3.0000 mL | INTRAVENOUS | Status: DC | PRN

## 2022-07-04 MED ORDER — ZOLPIDEM TARTRATE 5 MG PO TABS: 5.0000 mg | ORAL_TABLET | Freq: Every evening | ORAL | Status: DC | PRN

## 2022-07-04 MED ORDER — LORATADINE 10 MG PO TABS
10.0000 mg | ORAL_TABLET | Freq: Every day | ORAL | Status: DC
  Administered 2022-07-04 – 2022-08-05 (×32): 10 mg via ORAL
  Filled 2022-07-04 (×33): qty 1

## 2022-07-04 NOTE — Discharge Summary (Signed)
Postpartum Discharge Summary  Date of Service updated 07/04/2022     Patient Name: Brandi [name] DOB: [birth year] MRN: [id number]  Date of admission: 07/03/2022 Delivery date:This patient has no babies on file. Delivering provider: This patient has no babies on file. Date of discharge: 07/04/2022  Admitting diagnosis: Vaginal bleeding in pregnant patient after first trimester with placenta previa [O44.11] Intrauterine pregnancy: [redacted]w[redacted]d    Secondary diagnosis:  Principal Problem:   Vaginal bleeding in pregnant patient after first trimester with placenta previa  Additional problems: hypothyroid , history of cesarean section, anxiety   Dis[facility] diagnosis:  [facility] previa [facility]                                                Complications: vaginal bleeding at 270 weeks Hospital course: pt arrived 07/03/22 with bleeding, bleeding stabilized through the night, small amount with wiping. Pt requesting transfer to women's to continue receiving care with her OB/GYN providers.  Magnesium Sulfate received: No BMZ received: No Rhophylac:No Transfusion:No  Physical exam  Vitals:   07/04/22 0235 07/04/22 0557 07/04/22 0727 07/04/22 0816  BP:  (!) 109/58  (!) 101/57  Pulse:  81  90  Resp:  18 17   Temp:  98.2 F (36.8 C) 98.1 F (36.7 C)   TempSrc:  Oral Oral   SpO2: 97% 98%    Weight:      Height:       General: alert, cooperative, and no distress  minimal vaginal bleeding Uterine Fundus: soft, no contractions FHT 130s Accelerations present 15x15 Moderate variability No decelerations present.   Labs: Lab Results  Component Value Date   WBC 6.1 07/03/2022   HGB 9.7 (L) 07/03/2022   HCT 28.5 (L) 07/03/2022   MCV 89.6 07/03/2022   PLT 281 07/03/2022      Latest Ref Rng & Units 02/14/2022   10:10 AM  CMP  Glucose 70 - 99 mg/dL 99   BUN 6 - 20 mg/dL <5   Creatinine 0.44 - 1.00 mg/dL 0.55   Sodium 135 - 145 mmol/L 134   Potassium  3.5 - 5.1 mmol/L 3.3   Chloride 98 - 111 mmol/L 103   CO2 22 - 32 mmol/L 23   Calcium 8.9 - 10.3 mg/dL 9.2   Total Protein 6.5 - 8.1 g/dL 7.1   Total Bilirubin 0.3 - 1.2 mg/dL 0.6   Alkaline Phos 38 - 126 U/L 38   AST 15 - 41 U/L 18   ALT 0 - 44 U/L 15    Edinburgh Score:     No data to display            After visit meds:  Allergies as of 07/04/2022       Reactions   Adhesive [tape] Rash   Some bandaids   Neosporin [neomycin-bacitracin Zn-polymyx] Rash   Sulfa Antibiotics Rash        Medication List     STOP taking these medications    loratadine 10 MG tablet Commonly known as: CLARITIN   sertraline 50 MG tablet Commonly known as: ZOLOFT       TAKE these medications    acetaminophen 325 MG tablet Commonly known as: TYLENOL Take 2 tablets (650 mg total) by mouth every 4 (four) hours as needed (for pain scale <  4  OR  temperature  >/=  100.5 F).   ferrous gluconate 324 MG tablet Commonly known as: FERGON Take 324 mg by mouth daily with breakfast.   levothyroxine 50 MCG tablet Commonly known as: SYNTHROID TAKE 1 TABLET BY MOUTH EVERY DAY BEFORE BREAKFAST   PRENATAL VITAMINS PO Take by mouth.         Transfer to [facility] hospital via [facility]  Future Appointments: Future Appointments  Date Time Provider [place]  07/10/2022  1:20 PM Jerrol Banana., MD [facility]  07/30/2022  1:30 PM [facility] NURSE [facility]  07/30/2022  1:45 PM [facility] US6 [facility]        07/04/2022 Philip Aspen, CNM

## 2022-07-04 NOTE — Progress Notes (Addendum)
Received report from night shift RN: 100 ml clot around 6 am, likely from pooling overnight. Fetal and Toco monitoring continue to be Category I/no evidence of contractions. Patient is currently resting comfortably and denies pain. Her husband is in the room with her. Their strong preference is to be transferred to [facility] by private car. I spoke with Dr Gertie Exon who feels comfortable with the transfer given the clinical picture. He has a preference for ambulance transfer, however, he is also aware of patient preference and would be agreeable to that. He then spoke with on call Ob MD- Dr Harrington Challenger in [place]. I spoke with Dr Harrington Challenger who agrees that she should be in-house in [place] and her preference is transport by ambulance. She also understands that patient may prefer car transfer. I will speak with patient/family to discuss and we will initiate transfer.   Christean Leaf, CNM [facility] 07/04/2022, 7:59 AM   Update: I had a conversation with House Coverage in [place] who advised RN to RN report to [facility]. RN Alvis Lemmings will make call to Charge Nurse of that unit. I updated the patient regarding all of the conversations with recommendation for ambulance transfer. She and her husband prefer to transfer by private car. Risks of transfer by private car reviewed with patient. Report given to incoming [place]. Per Agricultural consultant at this facility, transfer process would be discontinued if they choose to [facility] themselves [facility] and patient would be signing out [place]. Deneise Lever will discuss this with the patient.    Rod Can, CNM

## 2022-07-04 NOTE — Progress Notes (Signed)
Pt discharged via [facility] to transfer to [facility]. Report was given to [place] and [facility] team. Vital signs taken and EMTALA formed signed by patient.

## 2022-07-04 NOTE — H&P (Addendum)
Brandi [name] is a 41 y.o. female presenting for vaginal bleeding  41 year old G3 P2-0-0-2 at 28+2 weeks confirmed by 8-week ultrasound presents in transfer from [facility] for vaginal bleeding.  Patient has a known placenta previa and has been followed with maternal-fetal medicine.  The patient experienced approximately 150 cc bleed during this episode.  She has remained hemodynamically stable.  Fetal tracing has been reassuring.  She was transferred to women's for an extended stay due to recurrent vaginal bleeding.   The patient had a bleeding episode in the first trimester that was concerning for spontaneous miscarriage.  However, a viable fetus was identified.  The patient had a second bleeding episode at 22 weeks.  She was admitted for approximately 3 days for that bleed.  She received antenatal steroids at approximately 24 weeks.   In the last MFM note from 8/8 :The placenta again appears to cover the internal os. There is again hypervascularity in the posterior portion of the cervix.  We will reassess the placental location at 32 weeks. We  discussed possibly ordering an MRI to better characterize the  placenta posterior in relation the cervix to r/o possible  posterior placent accreta invading the cervix.  Pregnancy Problems 1) AMA: NIPT low risk, Carrier screen neg CF/SMA/DMD/Frag X 2) Hypothyroidism: Synthroid 32mg 3) C/S x 2 4) Placenta Previa: Planned repeat C/S @ 35-37 weeks initially planned OB History     Gravida  3   Para  2   Term  2   Preterm      AB      Living  2      SAB      IAB      Ectopic      Multiple  0   Live Births  2          Past Medical History:  Diagnosis Date   Bronchitis    for last month.  started antibiotic 04/19/17   Family history of breast cancer    Family history of breast cancer    Family history of colon cancer    Family history of kidney cancer    Family history of ovarian cancer    Family history of uterine  cancer    Hypothyroidism    Migraine headache    "seems r/t menstrual cycle"   Motion sickness    all vehicles   Past Surgical History:  Procedure Laterality Date   CESAREAN SECTION  04/2010   CESAREAN SECTION N/A 01/10/2015   Procedure: CESAREAN SECTION;  Surgeon: TMelina Schools MD;  [place];  Service: Obstetrics;  Laterality: N/A;   COLONOSCOPY     COLONOSCOPY WITH PROPOFOL N/A 04/30/2017   Procedure: COLONOSCOPY WITH PROPOFOL;  Surgeon: WLucilla Lame MD;  [place]  Service: Endoscopy;  Laterality: N/A;   laprascopic     exploratory   OTHER SURGICAL HISTORY  2015   IVF egg retrieval   Family History: family history includes Aneurysm in her paternal grandfather; Anxiety disorder (age of onset: 569 in her mother; Breast cancer (age of onset: 51 in her maternal grandmother and mother; Colon cancer in her father; Dementia in her maternal grandmother; Endometrial cancer in her maternal grandmother; Hyperlipidemia in her father and mother; Kidney cancer in her paternal uncle; Melanoma in her father; Migraines in her mother; Stroke in her maternal grandfather. Social History:  reports that she has never smoked. She has never used smokeless tobacco. She reports that she does not currently use  alcohol after a past usage of about 4.0 standard drinks of alcohol per week. She reports that she does not use drugs.     Maternal Diabetes: DM screen not yet performed Genetic Screening: Normal Maternal Ultrasounds/Referrals: Other: Fetal Ultrasounds or other Referrals:  Referred to Materal Fetal Medicine  Maternal Substance Abuse:  No Significant Maternal Medications:  Meds include: Syntroid Significant Maternal Lab Results:  None Number of Prenatal Visits:greater than 3 verified prenatal visits Other Comments:  None  Review of Systems History   Blood pressure 104/64, pulse 87, temperature 98 F (36.7 C), temperature source Oral, SpO2 97 %. Exam Physical Exam   Prenatal labs: ABO, Rh: --/--/A POS (08/11 1155) Antibody: NEG (08/11 1155) Rubella:  Immune RPR: NON REACTIVE (08/10 1555)  HBsAg:   Neg HIV:   NR GBS:     NST reactive, cat 1 tracing 8/8 [place]:  Est. FW:   vtx 1323  gm    2 lb 15 oz      82  %  AFI 12.8 cm  Results for orders placed or performed during the hospital encounter of 07/04/22 (from the past 24 hour(s))  Type and screen [place]     Status: None   Collection Time: 07/04/22 11:55 AM  Result Value Ref Range   ABO/RH(D) A POS    Antibody Screen NEG    Sample Expiration      07/07/2022,2359 Performed at [facility], [place] [address] [place], [place] [address]      Assessment/Plan: 1) Admit Antenatal 2) S/P BMZ ~ 24 weeks on 7/18 & 7/19.  3) Continuous monitoring at this time 4) BR with BRP, ok to shower 5) T&S Q72 hrs 6) NICU consult 7) MOD: repeat C/S, concern for posterior placenta accreta d/t hypervascularity noted in the posterior cervix on [place] 8/8. Initial plan was to consider MRI.  8) Anemia of pregnancy: Hgb 9.7    D/W Dr. Gertie Exon, since patient has remained stable and bleeding minimal recommended holding repeat steroid course at this time. Plan on in patient care for at least 5-7 days, but may be longer. At this time, goal to achieve 32 weeks of pregnancy  Vanessa Kick 07/04/2022, 3:26 PM

## 2022-07-04 NOTE — Consult Note (Signed)
Neonatology Consult Note:  At the request of the patients obstetrician Dr. Harrington Challenger I met with Celene Kras (and her husband) who is a 41 y.o. female presenting for vaginal bleeding in the setting of placenta previa.  This is her third bleeding occurrence with previous bleeds in the first trimester and a second bleeding episode at 22 weeks.  She was admitted for approximately 3 days for that bleed.  She received antenatal steroids at approximately 24 weeks.   Pregnancy Problems 1) AMA: NIPT low risk, Carrier screen neg CF/SMA/DMD/Frag X 2) Hypothyroidism: Synthroid 43mg 3) C/S x 2 4) Placenta Previa: Planned repeat C/S @ 35-37 weeks initially planned  Current management includes continuous monitoring; since patient has remained stable and bleeding minimal MFM recommends holding repeat steroid course at this time. Plan on in patient care for at least 5-7 days, but may be longer. At this time, goal to achieve 32 weeks of pregnancy.  We discussed morbidity/mortality at this gestional age, delivery room resuscitation, including intubation and surfactant in DR.  Discussed mechanical ventilation and risk for chronic lung disease, risk for IVH with potential for motor / cognitive deficits, sepsis, as well as temperature instability and feeding immaturity.  Discussed NG / OG feeds, benefits of MBM in reducing incidence of NEC.   Discussed likely length of stay.  Thank you for allowing [place] participate in her care.  Please call with questions.  BHiginio Roger DO  Neonatologist  The total length of face-to-face or floor / unit time for this encounter was 35 minutes.  Counseling and / or coordination of care was greater than fifty percent of the time.

## 2022-07-05 ENCOUNTER — Inpatient Hospital Stay (HOSPITAL_COMMUNITY): Payer: BC Managed Care – PPO

## 2022-07-05 MED ORDER — LEVOTHYROXINE SODIUM 25 MCG PO TABS
50.0000 ug | ORAL_TABLET | Freq: Every day | ORAL | Status: DC
  Administered 2022-07-05 – 2022-08-05 (×32): 50 ug via ORAL
  Filled 2022-07-05 (×33): qty 2

## 2022-07-05 MED ORDER — MELATONIN 3 MG PO TABS
3.0000 mg | ORAL_TABLET | Freq: Every evening | ORAL | Status: DC | PRN
  Administered 2022-07-06: 3 mg via ORAL
  Filled 2022-07-05 (×2): qty 1

## 2022-07-05 MED ORDER — SERTRALINE HCL 50 MG PO TABS
25.0000 mg | ORAL_TABLET | Freq: Every day | ORAL | Status: DC
  Administered 2022-07-05 – 2022-07-07 (×3): 25 mg via ORAL
  Filled 2022-07-05 (×3): qty 1

## 2022-07-05 NOTE — Progress Notes (Signed)
HD #3, [redacted]W[redacted]D, placenta previa  Doing ok, minimal bleeding today, minimal cramps, +FM Afeb, VSS FHT-130s, reactive for 28 weeks  Will continue observation, ambulate as tol.  Will get MRI of pelvis to evaluate for possible placenta accreta.  She is s/p BMZ 7-18 and 19.  Previous c-section x 2, for repeat c-section of requires delivery

## 2022-07-06 NOTE — Progress Notes (Signed)
HD #4, [redacted]W[redacted]D, previa  Doing ok, had more cramping yesterday during MRI, off and on since then, minimal bleeding, +FM Afeb, VSS FHT reactive last pm  MRI confirms  posterior previa, no evidence of accreta  Will continue close observation

## 2022-07-07 LAB — TYPE AND SCREEN
ABO/RH(D): A POS
Antibody Screen: NEGATIVE

## 2022-07-07 MED ORDER — SERTRALINE HCL 20 MG/ML PO CONC: 25.0000 mg | Freq: Once | ORAL | Status: DC

## 2022-07-07 MED ORDER — TETANUS-DIPHTH-ACELL PERTUSSIS 5-2.5-18.5 LF-MCG/0.5 IM SUSY
0.5000 mL | PREFILLED_SYRINGE | Freq: Once | INTRAMUSCULAR | Status: AC
  Administered 2022-07-07: 0.5 mL via INTRAMUSCULAR
  Filled 2022-07-07: qty 0.5

## 2022-07-07 MED ORDER — SERTRALINE HCL 50 MG PO TABS
50.0000 mg | ORAL_TABLET | Freq: Every day | ORAL | Status: DC
  Administered 2022-07-08 – 2022-08-05 (×29): 50 mg via ORAL
  Filled 2022-07-07 (×29): qty 1

## 2022-07-07 MED ORDER — SODIUM CHLORIDE 0.9 % IV SOLN
500.0000 mg | Freq: Once | INTRAVENOUS | Status: AC
  Administered 2022-07-07: 500 mg via INTRAVENOUS
  Filled 2022-07-07: qty 500

## 2022-07-07 MED ORDER — SERTRALINE HCL 50 MG PO TABS
25.0000 mg | ORAL_TABLET | Freq: Once | ORAL | Status: AC
  Administered 2022-07-07: 25 mg via ORAL
  Filled 2022-07-07: qty 1

## 2022-07-07 MED ORDER — POLYSACCHARIDE IRON COMPLEX 150 MG PO CAPS
150.0000 mg | ORAL_CAPSULE | Freq: Every day | ORAL | Status: DC
  Administered 2022-07-07 – 2022-07-14 (×7): 150 mg via ORAL
  Filled 2022-07-07 (×9): qty 1

## 2022-07-07 NOTE — Progress Notes (Signed)
Patient ID: Brandi [name], female   DOB: 1981-06-02, 41 y.o.   MRN: [id number] HD #5 28 3/7 weeks, previa with intermittent bleeding  Pt had small amount of bleeding this AM on pad, mixed with mucus but looks to be less than 24m   Continues toi have some low cramping intermittently.  Baby very active.  She is feeling some increased emotional lability with stress of admission--had weaned her zoloft to '25mg'$ .    NST reactive  Pt now on her second significant bleed since beginning 2nd trimester, and still having daily red bleeding in small amounts--I feel for now she needs to remain in-house.  Will confer with Dr. BGertie Exonon delivery plan and if candidate for home management if bleeding stops S/p betamethasone 06/10/22 and 06/11/22   MRI with no evidence of accreta Anemia noted at 9.7 on admission [place]23:  persistent posterior previa, EFW 1323g (2 lb 15 oz) 82% %  AFI 12.8 cm--normal growth Will increase zoloft back to '50mg'$ 

## 2022-07-07 NOTE — Progress Notes (Addendum)
Patient ID: Brandi [name], female   DOB: [birth year], 41 y.o.   MRN: [id number]   addendum D/w Dr. Gertie Exon and he is in agreement with in-house management possibly until delivery.  If stops bleeding for more than a week may re-evaluate for home management, pt aware plan is to remain in-house at a minimum until next week.   Daily NST's and weekly AFI/placenta check with MFM per Dr. Len Blalock order for 07/09/22 Will order TDAP and glucola for tomorrow

## 2022-07-08 LAB — GLUCOSE, RANDOM: Glucose, Bld: 176 mg/dL — ABNORMAL HIGH (ref 70–99)

## 2022-07-08 MED ORDER — SODIUM CHLORIDE 0.9 % IV SOLN
500.0000 mg | Freq: Once | INTRAVENOUS | Status: AC
  Administered 2022-07-08: 500 mg via INTRAVENOUS
  Filled 2022-07-08: qty 500

## 2022-07-08 MED ORDER — SODIUM CHLORIDE 0.9 % IV SOLN: 300.0000 mg | Freq: Once | INTRAVENOUS | Status: DC

## 2022-07-08 NOTE — Progress Notes (Addendum)
Patient ID: Brandi [name], female   DOB: 06/30/1981, 41 y.o.   MRN: [id number] HD#6  Pt reports no bleeding overnight but noted similar bright red spotting on toilet paper just before shift change for the evening yesterday. Cramping is unchanged - mild and intermittent only; none at present. +FMs. She denies SOB or lightheadedness.   VSS:  115/63, 100 GEN - NAD ABD - soft, cw GA GU - no bleeding on pad at this time  EXT - no homans   1hr GTT - 176 Last t/s was on 07/07/22 Apos; Ab neg   EFM: 145, moderate variability TOCO - no ctxs   A/P: [name] at 28 4/[redacted]wks gestation with vaginal bleeding in pregnancy due to abnormal placenta; no previa or accreta per MRI; elevated 1hr gtt:, hx c/s   - Plan for 3hr gtt tomorrow ( fasting) - had discussion with pt; answered questions   - S/P BMZ 06/10/22 and 06/10/22 - S/P Tdap on 07/07/22  - Anemia - attempted iv infusion of iron stopped yesterday per pt due to swelling in her arm; will repeat cbc with t&s on 07/10/22 and if Hg lower, or if pt has another bleed, will attempt again   - Continue on zoloft '50mg'$  daily  - Daily NSTs and weekly AFI/placenta check - next due 07/09/22  - Reassess care plan with next bleed or on 07/14/22 ; MOD c/s

## 2022-07-08 NOTE — Progress Notes (Addendum)
Patient ID: Brandi [name], female   DOB: 01-06-1981, 41 y.o.   MRN: [id number] Called to pt room due to blood noted when used the bathroom  Noted a moderate amount on mucous tinged blood on pad   She denies contractions . +FMs appreciated  Last Hg was 9.7 on 07/03/22; HCT 28.5  Rec iv iron infusion today  Recheck cbc in am

## 2022-07-09 ENCOUNTER — Inpatient Hospital Stay (HOSPITAL_COMMUNITY): Payer: BC Managed Care – PPO

## 2022-07-09 ENCOUNTER — Inpatient Hospital Stay (HOSPITAL_BASED_OUTPATIENT_CLINIC_OR_DEPARTMENT_OTHER): Payer: BC Managed Care – PPO

## 2022-07-09 DIAGNOSIS — O4413 Placenta previa with hemorrhage, third trimester: Secondary | ICD-10-CM

## 2022-07-09 DIAGNOSIS — O4693 Antepartum hemorrhage, unspecified, third trimester: Secondary | ICD-10-CM

## 2022-07-09 DIAGNOSIS — Z3A29 29 weeks gestation of pregnancy: Secondary | ICD-10-CM | POA: Diagnosis not present

## 2022-07-09 LAB — CBC WITH DIFFERENTIAL/PLATELET
Abs Immature Granulocytes: 0.03 10*3/uL (ref 0.00–0.07)
Basophils Absolute: 0 10*3/uL (ref 0.0–0.1)
Basophils Relative: 1 %
Eosinophils Absolute: 0.1 10*3/uL (ref 0.0–0.5)
Eosinophils Relative: 1 %
HCT: 25.3 % — ABNORMAL LOW (ref 36.0–46.0)
Hemoglobin: 8.7 g/dL — ABNORMAL LOW (ref 12.0–15.0)
Immature Granulocytes: 0 %
Lymphocytes Relative: 18 %
Lymphs Abs: 1.4 10*3/uL (ref 0.7–4.0)
MCH: 31.1 pg (ref 26.0–34.0)
MCHC: 34.4 g/dL (ref 30.0–36.0)
MCV: 90.4 fL (ref 80.0–100.0)
Monocytes Absolute: 0.6 10*3/uL (ref 0.1–1.0)
Monocytes Relative: 8 %
Neutro Abs: 5.9 10*3/uL (ref 1.7–7.7)
Neutrophils Relative %: 72 %
Platelets: 277 10*3/uL (ref 150–400)
RBC: 2.8 MIL/uL — ABNORMAL LOW (ref 3.87–5.11)
RDW: 13.1 % (ref 11.5–15.5)
WBC: 8.2 10*3/uL (ref 4.0–10.5)
nRBC: 0 % (ref 0.0–0.2)

## 2022-07-09 LAB — GLUCOSE, CAPILLARY: Glucose-Capillary: 115 mg/dL — ABNORMAL HIGH (ref 70–99)

## 2022-07-09 LAB — GLUCOSE, 2 HOUR GESTATIONAL: Glucose Tolerance, 2 hour: 171 mg/dL

## 2022-07-09 LAB — GLUCOSE, 1 HOUR GESTATIONAL: Glucose Tolerance, 1 hour: 189 mg/dL

## 2022-07-09 LAB — GLUCOSE, 3 HOUR GESTATIONAL: Glucose Tolerance, 3 Hour: 140 mg/dL

## 2022-07-09 LAB — GLUCOSE, FASTING GESTATIONAL: Glucose Tolerance, Fasting: 87 mg/dL

## 2022-07-09 NOTE — Progress Notes (Addendum)
Patient ID: Brandi [name], female   DOB: [birth year], 41 y.o.   MRN: [id number] HD#7  Pt reports a small episode of bright red spotting on toilet paper this morning. Cramping is unchanged - mild and intermittent only; none at present. +FMs. She denies SOB or lightheadedness.   BP 119/64 (BP Location: Right Arm)   Pulse 96   Temp 98.2 F (36.8 C) (Oral)   Resp 18   LMP 12/20/2021   SpO2 98%   GEN - NAD ABD - deferred--currently on EFM/toco GU - no bleeding on pad at this time  EXT - negative edema   EFM: 150s, moderate variability, + accelerations TOCO - no ctxs   A/P: [name] at 42w5dadmitted with complete placenta previa (no accreta per MRI) and vaginal bleeding in the third trimester  --Failed 1hr gtt, 3hr gtt today:  89/189/171/140--FAILED.  Will place order for nutrition consultation and start checking fsbg -- S/P BMZ 06/10/22 and 06/10/22 -- S/P Tdap on 07/07/22  - Anemia - attempted iv infusion of iron stopped 8/15 per pt due to swelling in her arm; will repeat cbc with t&s on 07/10/22 and if Hg lower, or if pt has another bleed, consider attempt again   - Continue on zoloft '50mg'$  daily  - Daily NSTs and weekly AFI/placenta check - [place] is still [place]  - Reassess care plan with next bleed or on 07/14/22 ; MOD c/s

## 2022-07-10 ENCOUNTER — Ambulatory Visit: Payer: BC Managed Care – PPO | Admitting: Family Medicine

## 2022-07-10 LAB — CBC WITH DIFFERENTIAL/PLATELET
Abs Immature Granulocytes: 0.04 10*3/uL (ref 0.00–0.07)
Basophils Absolute: 0 10*3/uL (ref 0.0–0.1)
Basophils Relative: 0 %
Eosinophils Absolute: 0.1 10*3/uL (ref 0.0–0.5)
Eosinophils Relative: 1 %
HCT: 26.6 % — ABNORMAL LOW (ref 36.0–46.0)
Hemoglobin: 9.5 g/dL — ABNORMAL LOW (ref 12.0–15.0)
Immature Granulocytes: 1 %
Lymphocytes Relative: 12 %
Lymphs Abs: 0.9 10*3/uL (ref 0.7–4.0)
MCH: 31.9 pg (ref 26.0–34.0)
MCHC: 35.7 g/dL (ref 30.0–36.0)
MCV: 89.3 fL (ref 80.0–100.0)
Monocytes Absolute: 0.5 10*3/uL (ref 0.1–1.0)
Monocytes Relative: 6 %
Neutro Abs: 6.2 10*3/uL (ref 1.7–7.7)
Neutrophils Relative %: 80 %
Platelets: 298 10*3/uL (ref 150–400)
RBC: 2.98 MIL/uL — ABNORMAL LOW (ref 3.87–5.11)
RDW: 13.2 % (ref 11.5–15.5)
WBC: 7.7 10*3/uL (ref 4.0–10.5)
nRBC: 0 % (ref 0.0–0.2)

## 2022-07-10 LAB — TYPE AND SCREEN
ABO/RH(D): A POS
Antibody Screen: NEGATIVE

## 2022-07-10 LAB — GLUCOSE, CAPILLARY
Glucose-Capillary: 73 mg/dL (ref 70–99)
Glucose-Capillary: 83 mg/dL (ref 70–99)
Glucose-Capillary: 108 mg/dL — ABNORMAL HIGH (ref 70–99)

## 2022-07-10 NOTE — Progress Notes (Signed)
  Nutrition Dx: Food and nutrition-related knowledge deficit r/t no previous education aeb newly diagnosed GDM.   Nutrition education consult for Carbohydrate Modified Gestational Diabetic Diet completed.  "Meal  plan for gestational diabetics" handout given to patient.  Basic concepts reviewed.  Questions answered.  Patient verbalizes understanding. Pt had good comprehension of diet parameters prior to my visit. Serum glucose levels have normalized quickly on the CHO modified diet

## 2022-07-10 NOTE — Progress Notes (Deleted)
Complete physical exam   Patient: Brandi [name]   DOB: [birth year]   41 y.o. Female  MRN: [id number] Visit Date: 07/10/2022  Today's healthcare provider: Wilhemena Durie, MD   No chief complaint on file.  Subjective    Brandi [name] is a 41 y.o. female who presents today for a complete physical exam.  She reports consuming a {diet types:17450} diet. {Exercise:19826} She generally feels {well/fairly well/poorly:18703}. She reports sleeping {well/fairly well/poorly:18703}. She {does/does not:200015} have additional problems to discuss today.  HPI  ***  Past Medical History:  Diagnosis Date   Bronchitis    for last month.  started antibiotic 04/19/17   Family history of breast cancer    Family history of breast cancer    Family history of colon cancer    Family history of kidney cancer    Family history of ovarian cancer    Family history of uterine cancer    Hypothyroidism    Migraine headache    "seems r/t menstrual cycle"   Motion sickness    all vehicles   Past Surgical History:  Procedure Laterality Date   CESAREAN SECTION  04/2010   CESAREAN SECTION N/A 01/10/2015   Procedure: CESAREAN SECTION;  Surgeon: Melina Schools, MD;  Location: [facility];  Service: Obstetrics;  Laterality: N/A;   COLONOSCOPY     COLONOSCOPY WITH PROPOFOL N/A 04/30/2017   Procedure: COLONOSCOPY WITH PROPOFOL;  Surgeon: Lucilla Lame, MD;  Location: [facility];  Service: Endoscopy;  Laterality: N/A;   laprascopic     exploratory   OTHER SURGICAL HISTORY  2015   IVF egg retrieval   Social History   Socioeconomic History   Marital status: Married    Spouse name: Lalania Haseman   Number of children: 2   Years of education: Not on file   Highest education level: Bachelor's degree (e.g., BA, AB, BS)  Occupational History   Not on file  Tobacco Use   Smoking status: Never   Smokeless tobacco: Never  Vaping Use   Vaping Use: Never used  Substance and Sexual Activity    Alcohol use: Not Currently    Alcohol/week: 4.0 standard drinks of alcohol    Types: 4 Glasses of wine per week    Comment: not while preg   Drug use: No   Sexual activity: Yes  Other Topics Concern   Not on file  Social History Narrative   Not on file   Social Determinants of Health   Financial Resource Strain: Not on file  Food Insecurity: Not on file  Transportation Needs: Not on file  Physical Activity: Not on file  Stress: Not on file  Social Connections: Not on file  Intimate Partner Violence: Not on file   Family Status  Relation Name Status   Mother  Alive   Father  Alive   Brother  Alive   Pat Uncle  Deceased   MGM  Alive   MGF  Deceased   [place]  Deceased   PGF  Deceased   Brother  Alive   Pat Aunt  Alive   Son  Alive   Daughter  Alive   Neg Hx  (Not Specified)   Family History  Problem Relation Age of Onset   Migraines Mother    Hyperlipidemia Mother    Anxiety disorder Mother 98   Breast cancer Mother 43   Hyperlipidemia Father    Colon cancer Father    Melanoma Father    Kidney  cancer Paternal Uncle    Dementia Maternal Grandmother    Breast cancer Maternal Grandmother 28   Endometrial cancer Maternal Grandmother        dx 67s   Stroke Maternal Grandfather    Aneurysm Paternal Grandfather    Alcohol abuse Neg Hx    Arthritis Neg Hx    Asthma Neg Hx    Birth defects Neg Hx    COPD Neg Hx    Depression Neg Hx    Diabetes Neg Hx    Drug abuse Neg Hx    Early death Neg Hx    Hearing loss Neg Hx    Heart disease Neg Hx    Hypertension Neg Hx    Kidney disease Neg Hx    Learning disabilities Neg Hx    Mental illness Neg Hx    Mental retardation Neg Hx    Miscarriages / Stillbirths Neg Hx    Vision loss Neg Hx    Varicose Veins Neg Hx    Allergies  Allergen Reactions   Adhesive [Tape] Rash    Some bandaids   Neosporin [Neomycin-Bacitracin Zn-Polymyx] Rash   Sulfa Antibiotics Rash    Patient Care Team: Jerrol Banana., MD as  PCP - General (Family Medicine) Shivaji, Melida Quitter, MD as PCP - OBGYN (Obstetrics and Gynecology)   Medications: Facility-Administered Medications Prior to Visit  Medication Dose Route Frequency Provider   0.9 %  sodium chloride infusion  250 mL Intravenous PRN Vanessa Kick, MD   acetaminophen (TYLENOL) tablet 650 mg  650 mg Oral Q4H PRN Vanessa Kick, MD   calcium carbonate (TUMS - dosed in mg elemental calcium) chewable tablet 400 mg of elemental calcium  2 tablet Oral Q4H PRN Vanessa Kick, MD   docusate sodium (COLACE) capsule 100 mg  100 mg Oral Daily Vanessa Kick, MD   iron polysaccharides (NIFEREX) capsule 150 mg  150 mg Oral Daily Paula Compton, MD   levothyroxine (SYNTHROID) tablet 50 mcg  50 mcg Oral Q0600 Meisinger, Todd, MD   loratadine (CLARITIN) tablet 10 mg  10 mg Oral Daily Vanessa Kick, MD   melatonin tablet 3 mg  3 mg Oral QHS PRN Meisinger, Todd, MD   prenatal multivitamin tablet 1 tablet  1 tablet Oral Q1200 Vanessa Kick, MD   sertraline (ZOLOFT) tablet 50 mg  50 mg Oral Daily Paula Compton, MD   sodium chloride flush (NS) 0.9 % injection 3 mL  3 mL Intravenous Q12H Vanessa Kick, MD   sodium chloride flush (NS) 0.9 % injection 3 mL  3 mL Intravenous PRN Vanessa Kick, MD   zolpidem (AMBIEN) tablet 5 mg  5 mg Oral QHS PRN Vanessa Kick, MD   Outpatient Medications Prior to Visit  Medication Sig   acetaminophen (TYLENOL) 325 MG tablet Take 2 tablets (650 mg total) by mouth every 4 (four) hours as needed (for pain scale < 4  OR  temperature  >/=  100.5 F).   ferrous gluconate (FERGON) 324 MG tablet Take 324 mg by mouth daily with breakfast.   levothyroxine (SYNTHROID) 50 MCG tablet TAKE 1 TABLET BY MOUTH EVERY DAY BEFORE BREAKFAST   Prenatal Vit-Fe Fumarate-FA (PRENATAL VITAMINS PO) Take by mouth.   sertraline (ZOLOFT) 25 MG tablet Take 25 mg by mouth daily.    Review of Systems  All other systems reviewed and are negative.   {Labs  Heme  Chem  Endocrine   Serology  Results Review (optional):23779}  Objective    LMP 12/20/2021  {  Show previous vital signs (optional):23777}   Physical Exam  ***  Last depression screening scores    09/17/2021   11:46 AM 09/05/2020    3:57 PM 08/18/2019    9:33 AM  PHQ 2/9 Scores  PHQ - 2 Score 0 0 0  PHQ- 9 Score 1     Last fall risk screening    09/17/2021   11:45 AM  [place] in the past year? 0  Number falls in past yr: 0  Injury with Fall? 0  Risk for fall due to : No Fall Risks  Follow up Falls evaluation completed   Last Audit-C alcohol use screening    09/17/2021   11:45 AM  Alcohol Use Disorder Test (AUDIT)  1. How often do you have a drink containing alcohol? 3  2. How many drinks containing alcohol do you have on a typical day when you are drinking? 0  3. How often do you have six or more drinks on one occasion? 0  AUDIT-C Score 3  4. How often during the last year have you found that you were not able to stop drinking once you had started? 0  5. How often during the last year have you failed to do what was normally expected from you because of drinking? 0  6. How often during the last year have you needed a first drink in the morning to get yourself going after a heavy drinking session? 0  7. How often during the last year have you had a feeling of guilt of remorse after drinking? 0  8. How often during the last year have you been unable to remember what happened the night before because you had been drinking? 0  9. Have you or someone else been injured as a result of your drinking? 0  10. Has a relative or friend or a doctor or another health worker been concerned about your drinking or suggested you cut down? 0  Alcohol Use Disorder Identification Test Final Score (AUDIT) 3   A score of 3 or more in women, and 4 or more in men indicates increased risk for alcohol abuse, EXCEPT if all of the points are from question 1   Results for orders placed or performed during  the hospital encounter of 07/04/22  Glucose, random  Result Value Ref Range   Glucose, Bld 176 (H) 70 - 99 mg/dL  Glucose, fasting gestational  Result Value Ref Range   Glucose Tolerance, Fasting 87 mg/dL  CBC with Differential/Platelet  Result Value Ref Range   WBC 8.2 4.0 - 10.5 K/uL   RBC 2.80 (L) 3.87 - 5.11 MIL/uL   Hemoglobin 8.7 (L) 12.0 - 15.0 g/dL   HCT 25.3 (L) 36.0 - 46.0 %   MCV 90.4 80.0 - 100.0 fL   MCH 31.1 26.0 - 34.0 pg   MCHC 34.4 30.0 - 36.0 g/dL   RDW 13.1 11.5 - 15.5 %   Platelets 277 150 - 400 K/uL   nRBC 0.0 0.0 - 0.2 %   Neutrophils Relative % 72 %   Neutro Abs 5.9 1.7 - 7.7 K/uL   Lymphocytes Relative 18 %   Lymphs Abs 1.4 0.7 - 4.0 K/uL   Monocytes Relative 8 %   Monocytes Absolute 0.6 0.1 - 1.0 K/uL   Eosinophils Relative 1 %   Eosinophils Absolute 0.1 0.0 - 0.5 K/uL   Basophils Relative 1 %   Basophils Absolute 0.0 0.0 - 0.1 K/uL  Immature Granulocytes 0 %   Abs Immature Granulocytes 0.03 0.00 - 0.07 K/uL  Glucose, 1 hour gestational  Result Value Ref Range   Glucose Tolerance, 1 hour 189 mg/dL  Glucose, 2 hour gestational  Result Value Ref Range   Glucose Tolerance, 2 hour 171 mg/dL  Glucose, 3 hour gestational  Result Value Ref Range   Glucose Tolerance, 3 Hour 140 mg/dL  Glucose, capillary  Result Value Ref Range   Glucose-Capillary 115 (H) 70 - 99 mg/dL  Type and screen [place]  Result Value Ref Range   ABO/RH(D) A POS    Antibody Screen NEG    Sample Expiration      07/07/2022,2359 Performed at [facility] [address], [place], [place] [address]   Type and screen [place]  Result Value Ref Range   ABO/RH(D) A POS    Antibody Screen NEG    Sample Expiration      07/10/2022,2359 Performed at [facility], [place] [address] [place], [place] [address]     Assessment & Plan    Routine Health Maintenance and Physical Exam  Exercise Activities and Dietary  recommendations  Goals   None     Immunization History  Administered Date(s) Administered   Influenza, Seasonal, Injecte, Preservative Fre 09/18/2014   Influenza,inj,Quad PF,6+ Mos 09/27/2015, 09/11/2016, 09/15/2018, 09/05/2020, 09/17/2021   PFIZER(Purple Top)SARS-COV-2 Vaccination 02/23/2020, 03/21/2020   Tdap 10/30/2014, 07/07/2022    Health Maintenance  Topic Date Due   Hepatitis C Screening  Never done   PAP SMEAR-Modifier  02/21/2018   COVID-19 Vaccine (3 - Pfizer series) 05/16/2020   COLONOSCOPY (Pts 45-40yr Insurance coverage will need to be confirmed)  04/30/2022   INFLUENZA VACCINE  06/24/2022   TETANUS/TDAP  07/07/2032   HIV Screening  Completed   HPV VACCINES  Aged Out    Discussed health benefits of physical activity, and encouraged her to engage in regular exercise appropriate for her age and condition.  ***  No follow-ups on file.     {provider attestation***:1}   RWilhemena Durie MD  [phone](phone) [phone](fax)  [place]

## 2022-07-10 NOTE — Progress Notes (Signed)
Patient ID: Brandi [name], female   DOB: Apr 21, 1981, 41 y.o.   MRN: [id number] HD#8  Pt happy to not have had any bleeding this AM. Cramping is unchanged - mild and intermittent only; none at present. +FMs. She denies SOB or lightheadedness.   BP 113/64   Pulse 86   Temp 98.3 F (36.8 C) (Oral)   Resp 18   LMP 12/20/2021   SpO2 98%   GEN - NAD ABD - deferred--currently on EFM/toco GU - no bleeding on pad at this time  EXT - negative edema   EFM: 140s, moderate variability, + accelerations TOCO - no ctxs   A/P: [name] at 60w6dadmitted with complete placenta previa (no accreta per MRI) and vaginal bleeding in the third trimester  --New GDM: Failed 1hr gtt, 3hr gtt also failed 89/189/171/140    *S/p NDM, BG and diet ordered -- S/P BMZ 06/10/22 and 06/10/22 -- S/P Tdap on 07/07/22  - Anemia - attempted iv infusion of iron stopped 8/15 per pt due to swelling in her arm; CBC this AM with Hgb 9.7. Allow for arm swelling to decrease today and plan for repeat attempt at IV Venofer tomorrow  - Continue on zoloft '50mg'$  daily  - Daily NSTs and weekly AFI/placenta check  - AFI 13.77cm, no abruption noted  - Reassess care plan with next bleed or on 07/14/22 ; MOD c/s

## 2022-07-11 LAB — GLUCOSE, CAPILLARY
Glucose-Capillary: 73 mg/dL (ref 70–99)
Glucose-Capillary: 77 mg/dL (ref 70–99)
Glucose-Capillary: 89 mg/dL (ref 70–99)
Glucose-Capillary: 92 mg/dL (ref 70–99)

## 2022-07-11 NOTE — Progress Notes (Signed)
Brandi [name] 41 y.o. [id number] at 77w0dHD#9 admitted with complete placenta previa (no accreta per MRI) and vaginal bleeding in the third trimester  S: Had a small amount of blood tinged discharge after BM but otherwise no additional bleeding. No cramping. Very active FM  O: Vitals:   07/10/22 1601 07/10/22 2013 07/11/22 0827 07/11/22 1113  BP: (!) 117/59 118/87 115/62 (!) 103/55  Pulse: 87 97 94 87  Resp: '17 18 18 19  '$ Temp: 99 F (37.2 C) 98.7 F (37.1 C) 98.7 F (37.1 C) 97.8 F (36.6 C)  TempSrc: Oral Oral Oral Oral  SpO2: 99% 100% 95% 98%   GEN - NAD ABD - deferred--currently on EFM/toco GU - no bleeding on pad at this time  EXT - negative edema  NST 140bpm, mod variability, + accels, no decels Toco: some uterine irritability, no contractions  A/P: [name] at 232w0ddmitted with complete placenta previa (no accreta per MRI) and vaginal bleeding in the third trimester   --New GDMA1: S/p NDM, BG and diet ordered. Fasting 74, breakfast PP 77 today  -- S/P BMZ 06/10/22 and 06/10/22 -- S/P Tdap on 07/07/22   - Anemia - attempted iv infusion of iron stopped 8/15 per pt due to swelling in her arm; CBC yesterday AM with Hgb 9.7. Allow for arm swelling to decrease today and plan for repeat attempt at IV Venofer early next week   - Continue on zoloft '50mg'$  daily   - Daily NSTs and weekly AFI/placenta check  - last done 8/16 AFI 13.77cm, no abruption noted   - Reassess care plan with next bleed or on 07/14/22 ; MOD c/s   [name]8/18/23 12:18 PM

## 2022-07-11 NOTE — Inpatient Diabetes Management (Addendum)
ADA Standards of Care 2023 Diabetes in Pregnancy Target Glucose Ranges:  Fasting: 70 - 95 mg/dL 1 hr postprandial:  110 - '140mg'$ /dL (from first bite of meal) 2 hr postprandial:  100 - 120 mg/dL (from first bit of meal)    Note results from oral GTT.  Patient was seen by dietician on 07/10/22.  Spoke briefly with patient and her mother regarding GDM.  Patient was knowledgeable and stated that she plans to drink mostly water and occasional sprite zero.  We discussed goal blood sugars during pregnancy and that if she goes home, she will need to monitor blood sugars fasting and 2 hour PP using glucose meter.  She states that she knows that high blood sugars can affect baby's size and also lungs and is very motivated to keep blood sugars controlled.  Expect great compliance.  Needs continued follow-up with OB if d/c'd home if blood sugars rise > goal. Also if patient discharges home next week, she will need Rx for glucose meter and strips (#[id number]).   Thanks,  Adah Perl, RN, BC-ADM Inpatient Diabetes Coordinator Pager [phone]  (8a-5p)

## 2022-07-12 LAB — GLUCOSE, CAPILLARY
Glucose-Capillary: 85 mg/dL (ref 70–99)
Glucose-Capillary: 91 mg/dL (ref 70–99)
Glucose-Capillary: 117 mg/dL — ABNORMAL HIGH (ref 70–99)
Glucose-Capillary: 106 mg/dL — ABNORMAL HIGH (ref 70–99)

## 2022-07-12 NOTE — Progress Notes (Signed)
Brandi [name] 41 y.o. [id number] at 39w0dHD#10 admitted with complete placenta previa (no accreta per MRI) and vaginal bleeding in the third trimester  S: Denies any significant bleeding in last 24 hours, Active FM. C/O bilateral forearm discomfort at sites where her IVs infiltrated  O: Vitals:   07/11/22 1946 07/12/22 0811 07/12/22 1104 07/12/22 1505  BP:  (!) 104/57 124/75 113/81  Pulse: 88 86 96 (!) 103  Resp: '18 16 18 18  '$ Temp: 97.8 F (36.6 C) 98.7 F (37.1 C) 98.1 F (36.7 C) 98.8 F (37.1 C)  TempSrc: Oral Oral Oral Oral  SpO2: 100% 100% 100% 98%   AOx3, NAD Abd Soft  NST 140bpm, mod variability, + accels, no decels, Cat 1 tracing Toco: some uterine irritability, no contractions  Results for orders placed or performed during the hospital encounter of 07/04/22 (from the past 24 hour(s))  Glucose, capillary     Status: None   Collection Time: 07/11/22 10:36 PM  Result Value Ref Range   Glucose-Capillary 92 70 - 99 mg/dL  Glucose, capillary     Status: None   Collection Time: 07/12/22  8:10 AM  Result Value Ref Range   Glucose-Capillary 85 70 - 99 mg/dL  Glucose, capillary     Status: None   Collection Time: 07/12/22 11:04 AM  Result Value Ref Range   Glucose-Capillary 91 70 - 99 mg/dL  Glucose, capillary     Status: Abnormal   Collection Time: 07/12/22  3:03 PM  Result Value Ref Range   Glucose-Capillary 117 (H) 70 - 99 mg/dL     A/P: [name] at 252w0ddmitted with complete placenta previa (no accreta per MRI) and vaginal bleeding in the third trimester   --New GDMA1: S/p NDM, BG and diet ordered. BS well controlled with diet  -- S/P BMZ 06/10/22 and 06/10/22 -- S/P Tdap on 07/07/22   - Anemia - attempted iv infusion of iron stopped 8/15 per pt due to swelling in her arm; CBC yesterday AM with Hgb 9.7. Allow for arm swelling to decrease today and plan for repeat attempt at IV Venofer early next week. Will hold on replacing IV for now   - Continue on zoloft  '50mg'$  daily   - Daily NSTs and weekly AFI/placenta check  - last done 8/16 AFI 13.77cm, no abruption noted   - Reassess care plan with next bleed or on 07/14/22 ; MOD c/s   KeVanessa Kick8/19/23 6:45 PM

## 2022-07-13 LAB — TYPE AND SCREEN
ABO/RH(D): A POS
Antibody Screen: NEGATIVE

## 2022-07-13 LAB — GLUCOSE, CAPILLARY
Glucose-Capillary: 80 mg/dL (ref 70–99)
Glucose-Capillary: 92 mg/dL (ref 70–99)
Glucose-Capillary: 106 mg/dL — ABNORMAL HIGH (ref 70–99)
Glucose-Capillary: 80 mg/dL (ref 70–99)

## 2022-07-13 NOTE — Progress Notes (Signed)
Brandi [name] 41 y.o. [id number] at 26w0dHD#11 admitted with complete placenta previa (no accreta per MRI) and vaginal bleeding in the third trimester  S: Denies any significant bleeding in last 24 hours, Active FM. C/O bilateral forearm discomfort at sites where her IVs infiltrated, otherwise no concerns  O: Vitals:   07/12/22 2000 07/13/22 0803 07/13/22 1059 07/13/22 1533  BP: 121/67 (!) 112/52 136/62 (!) 138/59  Pulse: 94 95 97 93  Resp: '17 16 18 18  '$ Temp: 98.7 F (37.1 C) 98.2 F (36.8 C) 98.2 F (36.8 C) 98.7 F (37.1 C)  TempSrc: Oral Oral Oral Oral  SpO2: 100% 94% 98% 99%   AOx3, NAD Abd Soft  NST 130 bpm, mod variability, + accels, no decels, Cat 1 tracing Toco: some uterine irritability, no contractions  Results for orders placed or performed during the hospital encounter of 07/04/22 (from the past 24 hour(s))  Glucose, capillary     Status: Abnormal   Collection Time: 07/12/22  9:39 PM  Result Value Ref Range   Glucose-Capillary 106 (H) 70 - 99 mg/dL  Glucose, capillary     Status: None   Collection Time: 07/13/22  8:01 AM  Result Value Ref Range   Glucose-Capillary 80 70 - 99 mg/dL  Glucose, capillary     Status: None   Collection Time: 07/13/22 10:56 AM  Result Value Ref Range   Glucose-Capillary 92 70 - 99 mg/dL  [place] and screen [facility]    Status: None   Collection Time: 07/13/22 11:38 AM  Result Value Ref Range   ABO/RH(D) A POS    Antibody Screen NEG    Sample Expiration      07/16/2022,2359 [facility], [address], [place] [address]  Glucose, capillary     Status: Abnormal   Collection Time: 07/13/22  2:26 PM  Result Value Ref Range   Glucose-Capillary 106 (H) 70 - 99 mg/dL     A/P: [name] at 274w0ddmitted with complete placenta previa (no accreta per MRI) and vaginal bleeding in the third trimester   --New GDMA1: S/p NDM, BG and diet ordered. BS well controlled with diet  -- S/P  BMZ 06/10/22 and 06/10/22 -- S/P Tdap on 07/07/22   - Anemia - attempted iv infusion of iron stopped 8/15 per pt due to swelling in her arm; CBC yesterday AM with Hgb 9.7. Allow for arm swelling to decrease today and plan for repeat attempt at IV Venofer early next week. Will hold on replacing IV for now   - Continue on zoloft '50mg'$  daily   - Daily NSTs and weekly AFI/placenta check  - last done 8/16 AFI 13.77cm, no abruption noted   - Reassess care plan with next bleed or on 07/14/22 ; MOD c/s   KeVanessa Kick8/20/23 4:46 PM

## 2022-07-14 LAB — GLUCOSE, CAPILLARY
Glucose-Capillary: 82 mg/dL (ref 70–99)
Glucose-Capillary: 90 mg/dL (ref 70–99)
Glucose-Capillary: 103 mg/dL — ABNORMAL HIGH (ref 70–99)
Glucose-Capillary: 122 mg/dL — ABNORMAL HIGH (ref 70–99)

## 2022-07-14 MED ORDER — PRENATAL MULTIVITAMIN CH
1.0000 | ORAL_TABLET | Freq: Every day | ORAL | Status: DC
  Administered 2022-07-15 – 2022-07-31 (×17): 1 via ORAL
  Filled 2022-07-14 (×17): qty 1

## 2022-07-14 MED ORDER — DOCUSATE SODIUM 100 MG PO CAPS
100.0000 mg | ORAL_CAPSULE | Freq: Every day | ORAL | Status: DC
  Administered 2022-07-15 – 2022-07-31 (×17): 100 mg via ORAL
  Filled 2022-07-14 (×17): qty 1

## 2022-07-14 MED ORDER — POLYSACCHARIDE IRON COMPLEX 150 MG PO CAPS
150.0000 mg | ORAL_CAPSULE | Freq: Every day | ORAL | Status: DC
  Administered 2022-07-15 – 2022-08-05 (×22): 150 mg via ORAL
  Filled 2022-07-14 (×22): qty 1

## 2022-07-14 NOTE — Progress Notes (Addendum)
Patient ID: Brandi [name], female   DOB: [birth year], 41 y.o.   MRN: [id number] HD #12,  29 1/7 weeks--previa with intermittent bleeding, A1DM  Pt reports feeling ok.  Last significant red bleeding 07/10/22, small amount.  Minimal cramping and baby active.  She is not resting well, feels maybe her checks from nursing can be streamlined.  Is going on short walks off unit. Misses her kids, parents have moved in and are taking care of them.  Arms sore from IV's but improving  Afeb VSS Fundus gravid NT  D/w pt and husband in detail that we primarily want her to be safe and given she had a longer lingering bleed this last episode, we want to be cautious about outpatient management.  Since they live 20 min from hospital it is a little harder to get here fast but can stop at [facility] if necessary. Plan to stay in-house for another week and if no bleeding in that time may can consider outpatient management at 30+ weeks All CBG values in range on diet, will teach her to do her own FS Mood stable on zoloft, will streamline RN checks and decrease NST's to once daily Will leave saline lock out for now given arms still sore T&S current, will check CBC later this week.(07/18/22)  She is s/p venofer infusion last week 07/08/22 Check thyroid labs on 07/18/22 with CBC Will get MFM to weigh in on plan and see if needs weekly [place]   S/p betamethasone 06/10/22 and 06/11/22   MRI with no evidence of accreta [place] 07/01/22:  persistent posterior previa, EFW 1323g (2 lb 15 oz) 82% %  AFI 12.8 cm--normal growth

## 2022-07-15 ENCOUNTER — Inpatient Hospital Stay (HOSPITAL_BASED_OUTPATIENT_CLINIC_OR_DEPARTMENT_OTHER): Payer: BC Managed Care – PPO

## 2022-07-15 DIAGNOSIS — O4413 Placenta previa with hemorrhage, third trimester: Secondary | ICD-10-CM | POA: Diagnosis not present

## 2022-07-15 DIAGNOSIS — O99283 Endocrine, nutritional and metabolic diseases complicating pregnancy, third trimester: Secondary | ICD-10-CM

## 2022-07-15 DIAGNOSIS — O09523 Supervision of elderly multigravida, third trimester: Secondary | ICD-10-CM

## 2022-07-15 DIAGNOSIS — O34219 Maternal care for unspecified type scar from previous cesarean delivery: Secondary | ICD-10-CM | POA: Diagnosis not present

## 2022-07-15 DIAGNOSIS — E039 Hypothyroidism, unspecified: Secondary | ICD-10-CM

## 2022-07-15 DIAGNOSIS — Z3A29 29 weeks gestation of pregnancy: Secondary | ICD-10-CM

## 2022-07-15 DIAGNOSIS — O4693 Antepartum hemorrhage, unspecified, third trimester: Secondary | ICD-10-CM

## 2022-07-15 LAB — GLUCOSE, CAPILLARY
Glucose-Capillary: 84 mg/dL (ref 70–99)
Glucose-Capillary: 92 mg/dL (ref 70–99)
Glucose-Capillary: 114 mg/dL — ABNORMAL HIGH (ref 70–99)
Glucose-Capillary: 105 mg/dL — ABNORMAL HIGH (ref 70–99)

## 2022-07-15 NOTE — Progress Notes (Signed)
HD #13,  29.4 weeks--previa with intermittent bleeding, GDMA1   Patient feeling good.  Denies any bleeding for several days, Last significant red bleeding 07/10/22, small amount.  Active FM.  Had [place] this morning and per pt tech stated "vessls had calmed down".  Patient was hopeful this meant she could go home.  Discussed benefits and limitations of [place] and will continue plan as discussed with Dr. Marvel Plan (see note from yesterday).   Vitals:   07/13/22 2050 07/14/22 0812 07/14/22 2017 07/15/22 0743  BP: 119/72 (!) 96/53 114/70 111/60  Pulse: 95 83 95 84  Resp: '20 15 16 16  '$ Temp: 97.8 F (36.6 C) 98.9 F (37.2 C) 98.5 F (36.9 C) 98.4 F (36.9 C)  TempSrc: Oral Oral Oral Oral  SpO2: 99% 98% 99%    Gen: NAD Ext: no calf tenderness NST: yesterday 125 mod var +accels no decels, daily NST not yet performed today  Lab Results  Component Value Date   WBC 7.7 07/10/2022   HGB 9.5 (L) 07/10/2022   HCT 26.6 (L) 07/10/2022   MCV 89.3 07/10/2022   PLT 298 07/10/2022    --/--/A POS (08/20 1138)  A/P: Previa: MRI not showing signs of accreta, maintain active T&S, per MFM continue weekly scan with AFI check.  Prior c/s x 2.  If no further significant bleeding, [place] [facility] considering d/c home 07/21/2022, will continue to monitor. GDMA1 All but one BG values in range on diet Anxiety stable on zoloft Anemia: T&S current, will check CBC later this week.(07/18/22)  She is s/p venofer infusion last week 07/08/22, had arm swelling so have continued with oral iron. Check thyroid labs on 07/18/22 with CBC   S/p rescue dose betamethasone 06/10/22 and 06/11/22   MRI with no evidence of accreta [place] 07/15/22:  persistent posterior previa, AFI 40.98JX, cephalic [place] 07/25/4781: EFW 1323g (2 lb 15 oz) 82% %   Brandi [name]

## 2022-07-16 LAB — GLUCOSE, CAPILLARY
Glucose-Capillary: 80 mg/dL (ref 70–99)
Glucose-Capillary: 93 mg/dL (ref 70–99)
Glucose-Capillary: 87 mg/dL (ref 70–99)
Glucose-Capillary: 111 mg/dL — ABNORMAL HIGH (ref 70–99)

## 2022-07-16 NOTE — Progress Notes (Signed)
HD #14,  29.5 weeks--previa with intermittent bleeding, GDMA1   Patient feeling good.  Denies any bleeding for several days, Last significant red bleeding 07/10/22, small amount.  Rare BH contraction.  Is tolerating ambulating the hallways well.  Anxious to go home.     Vitals:   07/14/22 2017 07/15/22 0743 07/15/22 1952 07/16/22 0811  BP: 114/70 111/60 124/70 110/66  Pulse: 95 84 97 94  Resp: '16 16 17 18  '$ Temp: 98.5 F (36.9 C) 98.4 F (36.9 C) 99.1 F (37.3 C) 98.7 F (37.1 C)  TempSrc: Oral Oral Oral Oral  SpO2: 99%  99% 98%   Gen: NAD Ext: no calf tenderness NST: yesterday evening 130s, moderate variability, + accelerations, no decelerations; reactive daily NST not yet performed today  Lab Results  Component Value Date   WBC 7.7 07/10/2022   HGB 9.5 (L) 07/10/2022   HCT 26.6 (L) 07/10/2022   MCV 89.3 07/10/2022   PLT 298 07/10/2022    --/--/A POS (08/20 1138)  A/P: Previa: MRI not showing signs of accreta, maintain active T&S, per MFM continue weekly scan with AFI check.  Prior c/s x 2.  If no further significant bleeding, [place] [facility] considering outpatient management at 30+ weeks. GDMA1  BG values in range on diet Anxiety stable on zoloft Anemia: T&S current, will check CBC later this week.(07/18/22)  She is s/p venofer infusion last week 07/08/22, had arm swelling so have continued with oral iron. Check thyroid labs on 07/18/22 with CBC   S/p rescue dose betamethasone 06/10/22 and 06/11/22   MRI with no evidence of accreta [place] 07/15/22:  persistent posterior previa, AFI 01.41CV, cephalic [place] 0/11/3141: EFW 1323g (2 lb 15 oz) 82% %   Brandi [name]

## 2022-07-17 LAB — CBC WITH DIFFERENTIAL/PLATELET
Abs Immature Granulocytes: 0.04 10*3/uL (ref 0.00–0.07)
Basophils Absolute: 0 10*3/uL (ref 0.0–0.1)
Basophils Relative: 0 %
Eosinophils Absolute: 0 10*3/uL (ref 0.0–0.5)
Eosinophils Relative: 1 %
HCT: 29.1 % — ABNORMAL LOW (ref 36.0–46.0)
Hemoglobin: 10.1 g/dL — ABNORMAL LOW (ref 12.0–15.0)
Immature Granulocytes: 1 %
Lymphocytes Relative: 15 %
Lymphs Abs: 0.9 10*3/uL (ref 0.7–4.0)
MCH: 31.4 pg (ref 26.0–34.0)
MCHC: 34.7 g/dL (ref 30.0–36.0)
MCV: 90.4 fL (ref 80.0–100.0)
Monocytes Absolute: 0.4 10*3/uL (ref 0.1–1.0)
Monocytes Relative: 7 %
Neutro Abs: 4.7 10*3/uL (ref 1.7–7.7)
Neutrophils Relative %: 76 %
Platelets: 325 10*3/uL (ref 150–400)
RBC: 3.22 MIL/uL — ABNORMAL LOW (ref 3.87–5.11)
RDW: 13.8 % (ref 11.5–15.5)
WBC: 6.1 10*3/uL (ref 4.0–10.5)
nRBC: 0 % (ref 0.0–0.2)

## 2022-07-17 LAB — GLUCOSE, CAPILLARY
Glucose-Capillary: 80 mg/dL (ref 70–99)
Glucose-Capillary: 84 mg/dL (ref 70–99)
Glucose-Capillary: 99 mg/dL (ref 70–99)
Glucose-Capillary: 99 mg/dL (ref 70–99)

## 2022-07-17 LAB — TYPE AND SCREEN
ABO/RH(D): A POS
Antibody Screen: NEGATIVE

## 2022-07-17 LAB — T4, FREE: Free T4: 0.61 ng/dL (ref 0.61–1.12)

## 2022-07-17 LAB — TSH: TSH: 1.358 u[IU]/mL (ref 0.350–4.500)

## 2022-07-17 NOTE — Progress Notes (Signed)
41 y.o. [id number] 93w6dHD#15 admitted for Vaginal bleeding in pregnancy, second trimester [O46.92].  Pt currently stable with no c/o.  Good FM.  Vitals:   07/15/22 1952 07/16/22 0811 07/16/22 1215 07/16/22 2104  BP: 124/70 110/66 118/70 113/65  Pulse: 97 94 91 89  Resp: '17 18 18 16  '$ Temp: 99.1 F (37.3 C) 98.7 F (37.1 C) 98.4 F (36.9 C) 98.6 F (37 C)  TempSrc: Oral Oral Oral Oral  SpO2: 99% 98% 98% 100%    Lungs CTA Cor RRR Abd  Soft, gravid, nontender Ex SCDs FHTs  last night 120s, good short term variability, NST R Toco  occ  Results for orders placed or performed during the hospital encounter of 07/04/22 (from the past 24 hour(s))  Glucose, capillary     Status: None   Collection Time: 07/16/22  8:09 AM  Result Value Ref Range   Glucose-Capillary 80 70 - 99 mg/dL  Glucose, capillary     Status: None   Collection Time: 07/16/22 10:35 AM  Result Value Ref Range   Glucose-Capillary 93 70 - 99 mg/dL  Glucose, capillary     Status: None   Collection Time: 07/16/22  1:35 PM  Result Value Ref Range   Glucose-Capillary 87 70 - 99 mg/dL  Glucose, capillary     Status: Abnormal   Collection Time: 07/16/22  9:05 PM  Result Value Ref Range   Glucose-Capillary 111 (H) 70 - 99 mg/dL    A:  HD#13  285w6dith previa.  P: Previa: MRI not showing signs of accreta, maintain active T&S, per MFM continue weekly scan with AFI check.  Prior c/s x 2.  If no further significant bleeding, [place] [facility] considering outpatient management at 30+ weeks. GDMA1  BG values in range on diet Anxiety stable on zoloft Anemia: T&S current, will check CBC later this week.(07/18/22)  She is s/p venofer infusion last week 07/08/22, had arm swelling so have continued with oral iron. Check thyroid labs on 07/18/22 with CBC- labs ordered for tomorrow.   S/p rescue dose betamethasone 06/10/22 and 06/11/22   MRI with no evidence of accreta [place]/22/23:  persistent posterior previa, AFI 1370.96GEcephalic [place]/3/6/6294EFW 1323g (2 lb 15 oz) 82% %     MiDaria [name]

## 2022-07-17 NOTE — Progress Notes (Signed)
Results for orders placed or performed during the hospital encounter of 07/04/22 (from the past 24 hour(s))  Glucose, capillary     Status: Abnormal   Collection Time: 07/16/22  9:05 PM  Result Value Ref Range   Glucose-Capillary 111 (H) 70 - 99 mg/dL  Glucose, capillary     Status: None   Collection Time: 07/17/22  8:24 AM  Result Value Ref Range   Glucose-Capillary 80 70 - 99 mg/dL  Type and screen [place]     Status: None   Collection Time: 07/17/22 10:54 AM  Result Value Ref Range   ABO/RH(D) A POS    Antibody Screen NEG    Sample Expiration      07/20/2022,2359 Performed at [facility], [place] [address] [place], [place] [address]   CBC with Differential/Platelet     Status: Abnormal   Collection Time: 07/17/22 10:56 AM  Result Value Ref Range   WBC 6.1 4.0 - 10.5 K/uL   RBC 3.22 (L) 3.87 - 5.11 MIL/uL   Hemoglobin 10.1 (L) 12.0 - 15.0 g/dL   HCT 29.1 (L) 36.0 - 46.0 %   MCV 90.4 80.0 - 100.0 fL   MCH 31.4 26.0 - 34.0 pg   MCHC 34.7 30.0 - 36.0 g/dL   RDW 13.8 11.5 - 15.5 %   Platelets 325 150 - 400 K/uL   nRBC 0.0 0.0 - 0.2 %   Neutrophils Relative % 76 %   Neutro Abs 4.7 1.7 - 7.7 K/uL   Lymphocytes Relative 15 %   Lymphs Abs 0.9 0.7 - 4.0 K/uL   Monocytes Relative 7 %   Monocytes Absolute 0.4 0.1 - 1.0 K/uL   Eosinophils Relative 1 %   Eosinophils Absolute 0.0 0.0 - 0.5 K/uL   Basophils Relative 0 %   Basophils Absolute 0.0 0.0 - 0.1 K/uL   Immature Granulocytes 1 %   Abs Immature Granulocytes 0.04 0.00 - 0.07 K/uL  T4, free     Status: None   Collection Time: 07/17/22 10:56 AM  Result Value Ref Range   Free T4 0.61 0.61 - 1.12 ng/dL  TSH     Status: None   Collection Time: 07/17/22 10:56 AM  Result Value Ref Range   TSH 1.358 0.350 - 4.500 uIU/mL  Glucose, capillary     Status: None   Collection Time: 07/17/22 11:23 AM  Result Value Ref Range   Glucose-Capillary 84 70 - 99 mg/dL  Glucose, capillary     Status: None    Collection Time: 07/17/22  2:00 PM  Result Value Ref Range   Glucose-Capillary 99 70 - 99 mg/dL   Hemoglobin above 10.  TFTs normal.

## 2022-07-17 NOTE — Progress Notes (Signed)
Pt's T&S expires tonight at [facility]- rather than draw pt's blood twice, repeating CBC and getting TFTs today with T&S.  SCDs needed- d/w nurse and pt to keep on in bed.

## 2022-07-18 LAB — GLUCOSE, CAPILLARY
Glucose-Capillary: 84 mg/dL (ref 70–99)
Glucose-Capillary: 94 mg/dL (ref 70–99)
Glucose-Capillary: 94 mg/dL (ref 70–99)
Glucose-Capillary: 110 mg/dL — ABNORMAL HIGH (ref 70–99)

## 2022-07-18 NOTE — Plan of Care (Signed)
  Problem: Activity: Goal: Risk for activity intolerance will decrease Outcome: Completed/Met   Problem: Coping: Goal: Level of anxiety will decrease Outcome: Completed/Met   Problem: Elimination: Goal: Will not experience complications related to bowel motility Outcome: Completed/Met Goal: Will not experience complications related to urinary retention Outcome: Completed/Met   Problem: Education: Goal: Knowledge of disease or condition will improve Outcome: Completed/Met Goal: Knowledge of the prescribed therapeutic regimen will improve Outcome: Completed/Met   Problem: Education: Goal: Ability to describe self-care measures that may prevent or decrease complications (Diabetes Survival Skills Education) will improve Outcome: Completed/Met   Problem: Coping: Goal: Ability to adjust to condition or change in health will improve Outcome: Completed/Met   Problem: Nutritional: Goal: Maintenance of adequate nutrition will improve Outcome: Completed/Met   Problem: Coping: Goal: Ability to adjust to condition or change in health will improve Outcome: Completed/Met   Problem: Nutritional: Goal: Maintenance of adequate nutrition will improve Outcome: Completed/Met

## 2022-07-18 NOTE — Progress Notes (Signed)
Patient ID: Brandi [name], female   DOB: 24-Jan-1981, 41 y.o.   MRN: [id number] HD#16 Pt reports no vaginal bleeding since 07/09/22. She denies contractions or LOF. She is appreciating FMs Pt has questions about ongoing plan of care and specifically whether she will get to go home prior to delivery.   VSS GEN - teary, otherwise well, AAOx3 ABD - cw GA at 30 0/7wks  EXT - no homans   6.1>10.1<325 TSH - 1.35; T4 0.6  FSBS: 84/94  EFM - 150s, reactive  TOCO - no contractions  SVE - deferred   A: 41yo [id number] female at 24 0/[redacted]wks gestation with  Intermittent bleeding due to placenta previa ( posterior) - currently stable  continue weekly scans- due for growth scan next week Maintain active T&S - last done 07/17/22 Monitor for further bleeding No accreta per MRI   2. History of c/s x 2 - deliver via repeat cesarean section at 32 week  a. S/P BMZ on 7/18 and 7/19  3. GDMA1- controlled ; continue to check   4. Anemia - S/P venofer infusion on 07/08/22 ; discontinued due to arm swelling  so now on oral iron   - Last Hg 10.1 on 07/17/22; check q 72hrs   5. Anxiety - on zoloft '50mg'$  qd   Had discussion with pt and husband about chance for discharge home prior to delivery.  Pt initially expressed desire for this as has missed and children and felt would be able to be on bedrest at home with good support (parents have moved in to help) but her husband expressed concern. I explained that the decision would be made based on whether any further bleeding was noted,  fetal assessment, and the patients mental status/family comfort.after the over the weekend

## 2022-07-19 LAB — GLUCOSE, CAPILLARY
Glucose-Capillary: 78 mg/dL (ref 70–99)
Glucose-Capillary: 97 mg/dL (ref 70–99)
Glucose-Capillary: 96 mg/dL (ref 70–99)
Glucose-Capillary: 110 mg/dL — ABNORMAL HIGH (ref 70–99)

## 2022-07-19 NOTE — Progress Notes (Signed)
Patient ID: Brandi [name], female   DOB: 30-Sep-1981, 41 y.o.   MRN: [id number] HD#17 Pt reports no vaginal bleeding since 07/09/22. She denies contractions or LOF. She is appreciating Fms. Scant brown discharge this AM.   VSS GEN - teary, otherwise well, AAOx3 ABD - cw GA  EXT - no homans   Labs 8/24:    6.1>10.1<325     TSH - 1.35; T4 0.6  FSBS: 84/94/94/110, fasting 78  EFM - 150s, reactive  TOCO - no contractions  SVE - deferred   A: 41yo [id number] female at 31 [redacted]wks gestation with  Intermittent bleeding due to placenta previa ( posterior) - currently stable  continue weekly scans - ordered for Monday, GS planned 9/6 Maintain active T&S - last done 07/17/22 Monitor for further bleeding No accreta per MRI   2. History of c/s x 2 - deliver via repeat cesarean section at 32 week  a. S/P BMZ on 7/18 and 7/19  3. GDMA1- controlled ; continue to check   4. Anemia - S/P venofer infusion on 07/08/22 ; discontinued due to arm swelling  so now on oral iron   - Last Hg 10.1 on 07/17/22; check q 72hrs   5. Anxiety - on zoloft '50mg'$  qd   Discussed again recommendations for being in house until delivery, patient seems to be leaning this way as well. Is allowed for hall ambulation with SCDs while in bed, may have wheelchair privileges to deck, outside etc unless bleeding recurs.

## 2022-07-20 LAB — TYPE AND SCREEN
ABO/RH(D): A POS
Antibody Screen: NEGATIVE

## 2022-07-20 LAB — GLUCOSE, CAPILLARY
Glucose-Capillary: 76 mg/dL (ref 70–99)
Glucose-Capillary: 99 mg/dL (ref 70–99)
Glucose-Capillary: 98 mg/dL (ref 70–99)
Glucose-Capillary: 121 mg/dL — ABNORMAL HIGH (ref 70–99)

## 2022-07-20 NOTE — Progress Notes (Signed)
Patient ID: Brandi [name], female   DOB: Feb 15, 1981, 41 y.o.   MRN: [id number] HD#18 Pt reports no vaginal bleeding since 07/09/22. She denies contractions or LOF. She is appreciating Fms. Scant brown discharge this AM, unchanged from yesterday.   VSS GEN - teary, otherwise well, AAOx3 ABD - cw GA  EXT - no homans   Labs 8/24:    6.1>10.1<325     TSH - 1.35; T4 0.6  FSBS: 76/99  EFM - 140s, reactive  TOCO - no contractions  SVE - deferred   A: 41yo [id number] female at 60 2/[redacted]wks gestation with  Intermittent bleeding due to placenta previa ( posterior) - currently stable  continue weekly scans - ordered for Monday, GS planned 9/6 Maintain active T&S - last done 07/17/22 Monitor for further bleeding No accreta per MRI   2. History of c/s x 2 - deliver via repeat cesarean section at 32 week  a. S/P BMZ on 7/18 and 7/19  3. GDMA1- controlled ; continue to check   4. Anemia - S/P venofer infusion on 07/08/22 ; discontinued due to arm swelling  so now on oral iron   - Last Hg 10.1 on 07/17/22; check q 72hrs   5. Anxiety - on zoloft '50mg'$  qd   Has decided to remain in-house until delivery. Is allowed for hall ambulation with SCDs while in bed, may have wheelchair privileges to deck, outside etc unless bleeding recurs.

## 2022-07-21 ENCOUNTER — Inpatient Hospital Stay (HOSPITAL_BASED_OUTPATIENT_CLINIC_OR_DEPARTMENT_OTHER): Payer: BC Managed Care – PPO

## 2022-07-21 DIAGNOSIS — O99283 Endocrine, nutritional and metabolic diseases complicating pregnancy, third trimester: Secondary | ICD-10-CM | POA: Diagnosis not present

## 2022-07-21 DIAGNOSIS — E039 Hypothyroidism, unspecified: Secondary | ICD-10-CM

## 2022-07-21 DIAGNOSIS — O4693 Antepartum hemorrhage, unspecified, third trimester: Secondary | ICD-10-CM

## 2022-07-21 DIAGNOSIS — Z3A30 30 weeks gestation of pregnancy: Secondary | ICD-10-CM

## 2022-07-21 DIAGNOSIS — O34219 Maternal care for unspecified type scar from previous cesarean delivery: Secondary | ICD-10-CM | POA: Diagnosis not present

## 2022-07-21 DIAGNOSIS — O4413 Placenta previa with hemorrhage, third trimester: Secondary | ICD-10-CM

## 2022-07-21 DIAGNOSIS — O09523 Supervision of elderly multigravida, third trimester: Secondary | ICD-10-CM

## 2022-07-21 LAB — GLUCOSE, CAPILLARY
Glucose-Capillary: 82 mg/dL (ref 70–99)
Glucose-Capillary: 71 mg/dL (ref 70–99)
Glucose-Capillary: 106 mg/dL — ABNORMAL HIGH (ref 70–99)
Glucose-Capillary: 100 mg/dL — ABNORMAL HIGH (ref 70–99)

## 2022-07-21 NOTE — Progress Notes (Addendum)
Patient ID: Brandi [name], female   DOB: 1980/12/09, 41 y.o.   MRN: [id number] HD #19 Posterior previa with intermittent bleeding episodes @ 30 3/7 weeks  Afeb VSS FHR reactive on last PM monitoring   Gravid NT  1) Previa Pt stable with no significant VB in about 2 weeks Type and screen  q 3 days, next 07/23/22 (ordered)  [place] with MFM q week, growth scan 9/6 (ordered) No accreta on MRI Prior c-section x 2, plan repeat at 36 weeks if stable, possibly earlier if further bleeding  2) A1GDM well-controlled on diet  3) Anemia Last Hgb 10.1 on 07/17/22, will recheck with next labs Received venofer infusion x 1, on oral iron  4) Anxiety  Stable on zoloft  D/w pt plan in detail.  She and family are in agreement that since they live over 20 min from hospital and has had several bleeds it is safer for her to remain until delivery.  We will set a c-section date next week after her growth [place] with MFM.

## 2022-07-22 LAB — GLUCOSE, CAPILLARY
Glucose-Capillary: 81 mg/dL (ref 70–99)
Glucose-Capillary: 78 mg/dL (ref 70–99)
Glucose-Capillary: 81 mg/dL (ref 70–99)
Glucose-Capillary: 97 mg/dL (ref 70–99)

## 2022-07-22 NOTE — Progress Notes (Signed)
Patient ID: Brandi [name], female   DOB: 1981-03-12, 41 y.o.   MRN: [id number]  HD #20 Posterior previa with intermittent bleeding episodes @ 30 4/7 weeks  S: Reports active FM, no further bleeding, some mild cramping O:  Vitals:   07/20/22 1933 07/21/22 0806 07/21/22 1953 07/22/22 0903  BP: 115/68 (!) 95/57 118/70 (!) 101/59  Pulse: 97 100 85 92  Resp: '18 18 18 18  '$ Temp: 98.8 F (37.1 C) 98.5 F (36.9 C) 98.6 F (37 C) 98.9 F (37.2 C)  TempSrc: Oral Oral Oral Oral  SpO2: 98% 100% 99% 95%  Weight:      Height:        AOx3, NAD Gravid NT NST from last night Reactive, cat 1 tracing  1) Previa Pt stable with no significant VB in about 2 weeks Type and screen  q 3 days, next 07/23/22 (ordered)  [place] with MFM q week, growth scan 9/6 (ordered) No accreta on MRI Prior c-section x 2, plan repeat at 36 weeks if stable, possibly earlier if further bleeding  2) A1GDM well-controlled on diet  3) Anemia Last Hgb 10.1 on 07/17/22, will recheck with next labs Received venofer infusion x 1, on oral iron  4) Anxiety  Stable on zoloft  D/w pt plan in detail.  She and family are in agreement that since they live over 20 min from hospital and has had several bleeds it is safer for her to remain until delivery.  We will set a c-section date next week after her growth [place] with MFM.

## 2022-07-23 LAB — CBC WITH DIFFERENTIAL/PLATELET
Abs Immature Granulocytes: 0.02 10*3/uL (ref 0.00–0.07)
Basophils Absolute: 0 10*3/uL (ref 0.0–0.1)
Basophils Relative: 1 %
Eosinophils Absolute: 0.1 10*3/uL (ref 0.0–0.5)
Eosinophils Relative: 1 %
HCT: 27.7 % — ABNORMAL LOW (ref 36.0–46.0)
Hemoglobin: 9.6 g/dL — ABNORMAL LOW (ref 12.0–15.0)
Immature Granulocytes: 0 %
Lymphocytes Relative: 19 %
Lymphs Abs: 1.2 10*3/uL (ref 0.7–4.0)
MCH: 31.4 pg (ref 26.0–34.0)
MCHC: 34.7 g/dL (ref 30.0–36.0)
MCV: 90.5 fL (ref 80.0–100.0)
Monocytes Absolute: 0.6 10*3/uL (ref 0.1–1.0)
Monocytes Relative: 9 %
Neutro Abs: 4.2 10*3/uL (ref 1.7–7.7)
Neutrophils Relative %: 70 %
Platelets: 318 10*3/uL (ref 150–400)
RBC: 3.06 MIL/uL — ABNORMAL LOW (ref 3.87–5.11)
RDW: 14.6 % (ref 11.5–15.5)
WBC: 6.1 10*3/uL (ref 4.0–10.5)
nRBC: 0 % (ref 0.0–0.2)

## 2022-07-23 LAB — GLUCOSE, CAPILLARY
Glucose-Capillary: 82 mg/dL (ref 70–99)
Glucose-Capillary: 130 mg/dL — ABNORMAL HIGH (ref 70–99)

## 2022-07-23 LAB — TYPE AND SCREEN
ABO/RH(D): A POS
Antibody Screen: NEGATIVE

## 2022-07-23 NOTE — Progress Notes (Signed)
HD #21, [redacted]W[redacted]D, previa Doing well, no bleeding in a week, occ cramp, +FM Afeb, VSS FHT- reactive last night  Continue observation, plan repeat c-section for significant bleeding, next u/s on 9-6

## 2022-07-24 LAB — GLUCOSE, CAPILLARY
Glucose-Capillary: 81 mg/dL (ref 70–99)
Glucose-Capillary: 120 mg/dL — ABNORMAL HIGH (ref 70–99)

## 2022-07-24 NOTE — Progress Notes (Signed)
Patient is comfortable.  Denies LOF.  Reports good FM.  States had bleeding episode overnight that was light and has now subsided.  No other complaints.  Vitals:   07/22/22 2025 07/23/22 0822 07/23/22 2019 07/24/22 0801  BP: 112/66 102/62 116/68 117/61  Pulse: 87 94 84 88  Resp: '18 16 16 16  '$ Temp: 98.8 F (37.1 C) 97.9 F (36.6 C)  97.8 F (36.6 C)  TempSrc: Oral Oral  Oral  SpO2: 96% 95% 100% 97%  Weight:      Height:       Gen: NAD Ext: no calf tenderness FHT: 130 mod var + accels, no decels TOCO: quite SVE: Deferred  Lab Results  Component Value Date   WBC 6.1 07/23/2022   HGB 9.6 (L) 07/23/2022   HCT 27.7 (L) 07/23/2022   MCV 90.5 07/23/2022   PLT 318 07/23/2022    --/--/A POS (08/30 0747)  A/P HD#22 with placenta previa and bleeding   1) Previa Pt stable first episode of bleeding last night in >1 week Type and screen  q 3 days, last 8/30 [place] with MFM q week, growth scan 9/6 (ordered) No accreta on MRI Prior c-section x 2, plan repeat at 36 weeks if stable, possibly earlier if further bleeding Given bleeding overnight, will do continuous monitoring for now   2) A1GDM well-controlled on diet   3) Anemia Last Hgb 9.6 on 07/23/22 Received venofer infusion x 1, on oral iron   4) Anxiety             Stable on zoloft   Allyn Kenner

## 2022-07-25 LAB — GLUCOSE, CAPILLARY
Glucose-Capillary: 87 mg/dL (ref 70–99)
Glucose-Capillary: 86 mg/dL (ref 70–99)

## 2022-07-25 NOTE — Progress Notes (Signed)
Patient ID: Taiya Nutting, female   DOB: 16-Dec-1980, 41 y.o.   MRN: [id number] HD#23 Pt doing well. Sitting up in chair getting hair done. In good spirits Pt reports had first bleed in >1week yesterday. Had some more last night and this am but less ; only when wipes. . No ctxs.Marland Kitchen +FMs VSS EFM - 150, cat 1 TOCO - no contractions  SVE - deferred   A/P: HD#23 with placenta previa and bleeding   1) Previa -Pt with bleeding overnight and this am- decreased -Type and screen  q 3 days, last 8/30 -[place] with MFM q week, growth scan 9/6 (ordered) -No accreta on MRI -Prior c-section x 2, plan repeat at 36 weeks if stable, possibly earlier if further bleeding persists  -S/P BMZ on 7/18 and 7/19 -May return to intermittent monitoring   2) A1GDM well-controlled on diet; continue to check    3) Anemia Last Hgb 9.6 on 07/23/22 Attempted venofer infusions; received x 1  now on oral iron   4) Anxiety             Stable on zoloft

## 2022-07-25 NOTE — Progress Notes (Signed)
Patient ID: Brandi [name], female   DOB: 1981-09-18, 41 y.o.   MRN: [id number] Late entry  Pad check for pt since still having mild bleeding. # pads in trash all with about 1/8th surface with blood; not soaked. Bright red Pt denies any pain, contractions or discomfort.  FHTs wnl -130s, reactive TOCO - no contractions SVE - deferred  Continue on continuous monitoring . Pt reassured

## 2022-07-26 LAB — GLUCOSE, CAPILLARY
Glucose-Capillary: 78 mg/dL (ref 70–99)
Glucose-Capillary: 98 mg/dL (ref 70–99)

## 2022-07-26 LAB — CBC
HCT: 29.3 % — ABNORMAL LOW (ref 36.0–46.0)
Hemoglobin: 9.6 g/dL — ABNORMAL LOW (ref 12.0–15.0)
MCH: 30.9 pg (ref 26.0–34.0)
MCHC: 32.8 g/dL (ref 30.0–36.0)
MCV: 94.2 fL (ref 80.0–100.0)
Platelets: 275 10*3/uL (ref 150–400)
RBC: 3.11 MIL/uL — ABNORMAL LOW (ref 3.87–5.11)
RDW: 14.7 % (ref 11.5–15.5)
WBC: 6 10*3/uL (ref 4.0–10.5)
nRBC: 0 % (ref 0.0–0.2)

## 2022-07-26 LAB — TYPE AND SCREEN
ABO/RH(D): A POS
Antibody Screen: NEGATIVE

## 2022-07-26 NOTE — Progress Notes (Signed)
Antepartum Progress Note  HD#24 with placenta previa and bleeding  S: Had 3 very small clots in toilet this AM, otherwise very light bright red bleeding that is stable from last few days. Normal FM. No contractions or LOF.   Today's Vitals   07/25/22 0828 07/25/22 1548 07/25/22 1950 07/26/22 0914  BP:  108/64 116/60   Pulse:  85 92   Resp:  '16 16 18  '$ Temp:  98.8 F (37.1 C) 98.6 F (37 C) 98.6 F (37 C)  TempSrc:  Oral  Oral  SpO2:  96% 98% 98%  Weight:      Height:      PainSc: 0-No pain  0-No pain    Body mass index is 25.39 kg/m.  Gen: well appearing CVS: normal pulses Lungs: nonlabored respirations Abd: soft, nontender  NST 135bpm, mod variability, + accels, no decels Toco: quiet  A/P 40Y G3P2002 @ [redacted]w[redacted]d HD#24 with placenta previa and bleeding   1) Previa Stable scant bleeding with few clots this morning, saline lock IV replaced Type and screen  q 3 days, last 9/2 [place], growth scan 9/6 (ordered) No accreta on MRI Prior c-section x 2, plan repeat at 36 weeks if stable, possibly earlier if further bleeding Continuous monitoring given recent bleeding episodes   2) A1GDM well-controlled on diet   3) Anemia Last Hgb 9.6 on 07/26/22 Received venofer infusion x 1, on oral iron   4) Anxiety             Stable on zoloft  M. MBrien Mates MD 07/26/22 1:44 PM

## 2022-07-26 NOTE — Progress Notes (Signed)
Wireless toco battery low. I suggested that patient be monitored with wired toco and u/s until wireless Powers charges. Patient requested to leave toco off until it charges and told me she would let me know if she felt contractions.

## 2022-07-26 NOTE — Progress Notes (Signed)
Infant active and difficult to keep on FHM. Pt requested to d/c monitoring for the night since bleeding has ceased since this AM. Dr Brien Mates notified. Ok to stop monitoring unless bleeding or UC start again.

## 2022-07-27 LAB — GLUCOSE, CAPILLARY
Glucose-Capillary: 85 mg/dL (ref 70–99)
Glucose-Capillary: 98 mg/dL (ref 70–99)

## 2022-07-27 MED ORDER — SODIUM CHLORIDE 0.9 % IV SOLN
500.0000 mg | Freq: Once | INTRAVENOUS | Status: AC
  Administered 2022-07-27: 500 mg via INTRAVENOUS
  Filled 2022-07-27: qty 500

## 2022-07-27 MED ORDER — SODIUM CHLORIDE 0.9 % IV BOLUS: 500.0000 mL | Freq: Once | INTRAVENOUS | Status: DC | PRN

## 2022-07-27 MED ORDER — EPINEPHRINE PF 1 MG/ML IJ SOLN: 0.3000 mg | Freq: Once | INTRAMUSCULAR | Status: DC | PRN

## 2022-07-27 MED ORDER — SODIUM CHLORIDE 0.9 % IV SOLN: INTRAVENOUS | Status: DC | PRN

## 2022-07-27 MED ORDER — METHYLPREDNISOLONE SODIUM SUCC 125 MG IJ SOLR: 125.0000 mg | Freq: Once | INTRAMUSCULAR | Status: DC | PRN

## 2022-07-27 MED ORDER — DIPHENHYDRAMINE HCL 50 MG/ML IJ SOLN: 25.0000 mg | Freq: Once | INTRAMUSCULAR | Status: DC | PRN

## 2022-07-27 MED ORDER — ALBUTEROL SULFATE (2.5 MG/3ML) 0.083% IN NEBU: 2.5000 mg | INHALATION_SOLUTION | Freq: Once | RESPIRATORY_TRACT | Status: DC | PRN

## 2022-07-27 NOTE — Progress Notes (Signed)
Antepartum Progress Note   HD#25 with placenta previa and bleeding   S: Very light spotting yesterday and no bleeding overnight. Had requested to come off monitor overnight, doing well this morning. Normal FM. No contractions or LOF.   Today's Vitals   07/26/22 0914 07/26/22 1941 07/26/22 2045 07/27/22 1152  BP:   126/73 115/65  Pulse:   97 90  Resp: '18  19 17  '$ Temp: 98.6 F (37 C)  99.2 F (37.3 C) 98.2 F (36.8 C)  TempSrc: Oral  Oral Oral  SpO2: 98%  98% 97%  Weight:      Height:      PainSc:  0-No pain     Body mass index is 25.39 kg/m.    Gen: well appearing CVS: normal pulses Lungs: nonlabored respirations Abd: soft, nontender   NST 140bpm, mod variability, + accels, no decels Toco: quiet A/P 40Y G3P2002 @ [redacted]w[redacted]d HD#24 with placenta previa and bleeding   1) Previa Stable scant bleeding with few clots yesterday morning, no bleeding overnight  saline lock IV replaced yesterday Type and screen  q 3 days, last 9[place], growth scan 9/6 (ordered) No accreta on MRI Prior c-section x 2, plan repeat at 36 weeks if stable, possibly earlier if further bleeding Return to NSTs TID as long as no additional bleeding episodes   2) A1GDM well-controlled on diet   3) Anemia Last Hgb 9.6 on 07/26/22 Received venofer infusion x 1, on oral iron, will repeat venofer infusion today to optimize for delivery   4) Anxiety             Stable on zoloft   M. MBrien Mates MD 07/27/22 12:59 PM

## 2022-07-28 LAB — GLUCOSE, CAPILLARY
Glucose-Capillary: 88 mg/dL (ref 70–99)
Glucose-Capillary: 96 mg/dL (ref 70–99)

## 2022-07-28 NOTE — Progress Notes (Addendum)
Antepartum Progress Note   HD#26 with placenta previa and bleeding   S: One-time blood-tinged discharge this morning after first waking up, is still on continuous monitoring since yesterday evening's minor bleeding (reported one walnut sized clot). Normal FM. No contractions or LOF.   Today's Vitals   07/27/22 1946 07/28/22 0806 07/28/22 0807 07/28/22 1010  BP: 122/71  105/63   Pulse: 86  83   Resp: 18  17   Temp:   98.8 F (37.1 C)   TempSrc:   Oral   SpO2: 97%  98%   Weight:      Height:      PainSc:  0-No pain  0-No pain   Body mass index is 25.39 kg/m.    Gen: well appearing CVS: normal pulses Lungs: nonlabored respirations Abd: soft, nontender   NST 140-150bpm, mod variability, + accels, no decels Toco: quiet  A/P 40Y G3P2002 @ [redacted]w[redacted]d HD#25 with placenta previa and bleeding   1) Previa Blood tinged discharge this morning, one clot last night, currently on CEFM with category 1 tracing - [place] back to NST TID if reassuring saline lock IV replaced  Type and screen  q 3 days, last 9/2 [place], growth scan 9/6 (ordered) No accreta on MRI Prior c-section x 2, plan repeat at 36 weeks if stable, possibly earlier if further bleeding Return to NSTs TID as long as no additional bleeding episodes   2) A1GDM well-controlled on diet   3) Anemia Last Hgb 9.6 on 07/26/22 Received venofer infusion x 2 now with second dose 9/3   4) Anxiety             Stable on zoloft

## 2022-07-28 NOTE — Progress Notes (Signed)
Reviewed CEFM, Cat 1 tracing, no activity on TOCO. No cramping nor further VB/bloody discharge since AM. May switch back to NST TID at this time. BP 117/70 (BP Location: Right Arm)   Pulse 100   Temp 98.9 F (37.2 C) (Oral)   Resp 18   Ht '5\' 6"'$  (1.676 m)   Wt 71.4 kg   LMP 12/20/2021   SpO2 98%   BMI 25.39 kg/m

## 2022-07-29 LAB — CBC WITH DIFFERENTIAL/PLATELET
Abs Immature Granulocytes: 0.02 10*3/uL (ref 0.00–0.07)
Basophils Absolute: 0 10*3/uL (ref 0.0–0.1)
Basophils Relative: 0 %
Eosinophils Absolute: 0.1 10*3/uL (ref 0.0–0.5)
Eosinophils Relative: 1 %
HCT: 28 % — ABNORMAL LOW (ref 36.0–46.0)
Hemoglobin: 9.7 g/dL — ABNORMAL LOW (ref 12.0–15.0)
Immature Granulocytes: 0 %
Lymphocytes Relative: 24 %
Lymphs Abs: 1.4 10*3/uL (ref 0.7–4.0)
MCH: 31.3 pg (ref 26.0–34.0)
MCHC: 34.6 g/dL (ref 30.0–36.0)
MCV: 90.3 fL (ref 80.0–100.0)
Monocytes Absolute: 0.6 10*3/uL (ref 0.1–1.0)
Monocytes Relative: 10 %
Neutro Abs: 3.8 10*3/uL (ref 1.7–7.7)
Neutrophils Relative %: 65 %
Platelets: 271 10*3/uL (ref 150–400)
RBC: 3.1 MIL/uL — ABNORMAL LOW (ref 3.87–5.11)
RDW: 14.5 % (ref 11.5–15.5)
WBC: 5.8 10*3/uL (ref 4.0–10.5)
nRBC: 0 % (ref 0.0–0.2)

## 2022-07-29 LAB — TYPE AND SCREEN
ABO/RH(D): A POS
Antibody Screen: NEGATIVE

## 2022-07-29 LAB — GLUCOSE, CAPILLARY
Glucose-Capillary: 86 mg/dL (ref 70–99)
Glucose-Capillary: 127 mg/dL — ABNORMAL HIGH (ref 70–99)

## 2022-07-29 NOTE — Progress Notes (Signed)
Antepartum Progress Note   HD#27 with placenta previa and bleeding   S: Passed small 2 cm sized clot when voiding this AM.  Bleeding to follow was minimal. Denies any contractions, cramping, low back pain + Normal FM. No LOF.  Toco is quiet  Today's Vitals   07/28/22 1504 07/28/22 2004 07/29/22 0831 07/29/22 0839  BP: 117/70   107/70  Pulse: 100   96  Resp: 18   19  Temp: 98.9 F (37.2 C)   98.7 F (37.1 C)  TempSrc: Oral   Oral  SpO2:      Weight:      Height:      PainSc:  0-No pain 0-No pain    Body mass index is 25.39 kg/m.   Gen: well appearing Lungs: nonlabored respirations Abd: soft, nontender   NST 140-150bpm, mod variability, + accels, no decels Toco: quiet  A/P 40Y G3P2002 @ [redacted]w[redacted]d HD#27 with placenta previa and bleeding   1) Previa One clot last night, Plan extended monitoring this AM, switch to continuous prn saline lock IV replaced  Type and screen  q 3 days, last 9/5 [place], growth scan 9/6 (ordered) No accreta on MRI Prior c-section x 2, plan repeat at 36 weeks if stable, possibly earlier if further bleeding Return to NSTs TID as long as no additional bleeding episodes   2) A1GDM well-controlled on diet   3) Anemia Last Hgb 9.7 on 07/29/22 Received venofer infusion x 2 now with second dose 9/3   4) Anxiety             Stable on zoloft

## 2022-07-30 ENCOUNTER — Ambulatory Visit: Payer: BC Managed Care – PPO

## 2022-07-30 ENCOUNTER — Inpatient Hospital Stay (HOSPITAL_BASED_OUTPATIENT_CLINIC_OR_DEPARTMENT_OTHER): Payer: BC Managed Care – PPO

## 2022-07-30 DIAGNOSIS — Z3A32 32 weeks gestation of pregnancy: Secondary | ICD-10-CM

## 2022-07-30 DIAGNOSIS — E039 Hypothyroidism, unspecified: Secondary | ICD-10-CM | POA: Diagnosis not present

## 2022-07-30 DIAGNOSIS — O4693 Antepartum hemorrhage, unspecified, third trimester: Secondary | ICD-10-CM | POA: Diagnosis not present

## 2022-07-30 DIAGNOSIS — O09523 Supervision of elderly multigravida, third trimester: Secondary | ICD-10-CM

## 2022-07-30 DIAGNOSIS — O99283 Endocrine, nutritional and metabolic diseases complicating pregnancy, third trimester: Secondary | ICD-10-CM

## 2022-07-30 DIAGNOSIS — O4413 Placenta previa with hemorrhage, third trimester: Secondary | ICD-10-CM | POA: Diagnosis not present

## 2022-07-30 DIAGNOSIS — O34219 Maternal care for unspecified type scar from previous cesarean delivery: Secondary | ICD-10-CM

## 2022-07-30 LAB — CBC
HCT: 27.8 % — ABNORMAL LOW (ref 36.0–46.0)
Hemoglobin: 9.7 g/dL — ABNORMAL LOW (ref 12.0–15.0)
MCH: 31.8 pg (ref 26.0–34.0)
MCHC: 34.9 g/dL (ref 30.0–36.0)
MCV: 91.1 fL (ref 80.0–100.0)
Platelets: 276 10*3/uL (ref 150–400)
RBC: 3.05 MIL/uL — ABNORMAL LOW (ref 3.87–5.11)
RDW: 14.6 % (ref 11.5–15.5)
WBC: 6.6 10*3/uL (ref 4.0–10.5)
nRBC: 0 % (ref 0.0–0.2)

## 2022-07-30 LAB — GLUCOSE, CAPILLARY
Glucose-Capillary: 88 mg/dL (ref 70–99)
Glucose-Capillary: 141 mg/dL — ABNORMAL HIGH (ref 70–99)

## 2022-07-30 LAB — AMNISURE RUPTURE OF MEMBRANE (ROM) NOT AT ARMC: Amnisure ROM: POSITIVE

## 2022-07-30 MED ORDER — LACTATED RINGERS IV BOLUS
500.0000 mL | Freq: Once | INTRAVENOUS | Status: AC
  Administered 2022-07-30: 500 mL via INTRAVENOUS

## 2022-07-30 MED ORDER — SODIUM CHLORIDE 0.9% FLUSH
10.0000 mL | Freq: Two times a day (BID) | INTRAVENOUS | Status: DC
  Administered 2022-07-31 (×2): 10 mL via INTRAVENOUS

## 2022-07-30 MED ORDER — AZITHROMYCIN 250 MG PO TABS
1000.0000 mg | ORAL_TABLET | Freq: Once | ORAL | Status: AC
  Administered 2022-07-30: 1000 mg via ORAL
  Filled 2022-07-30: qty 4

## 2022-07-30 MED ORDER — SODIUM CHLORIDE 0.9 % IV SOLN
2.0000 g | Freq: Four times a day (QID) | INTRAVENOUS | Status: DC
  Administered 2022-07-30 – 2022-07-31 (×7): 2 g via INTRAVENOUS
  Filled 2022-07-30 (×7): qty 2000

## 2022-07-30 MED ORDER — AMOXICILLIN 500 MG PO CAPS: 500.0000 mg | ORAL_CAPSULE | Freq: Three times a day (TID) | ORAL | Status: DC

## 2022-07-30 NOTE — Progress Notes (Signed)
Patient ID: Keyanna Sandefer, female   DOB: [birth year], 41 y.o.   MRN: [id number]  41 year old G3 P2-0-0-2 at 31+5 with placenta previa and vaginal bleeding.  Hospital day #28  Called to see patient for acute episode of vaginal bleeding.  Patient got up to go to the bathroom and noted leaking of fluid.  When she looked in the toilet it was actually blood.  Called for help, was returned to her bed.  Patient was placed on the monitor.  Fetal wellbeing was reassuring.  Once she returned to the bed the bleeding decreased quickly.  Difficult to quantify volume of blood in the toilet approximately 108 cc was measured on her pads.  Initially patient was hypotensive and tachycardic.  This improved quickly with an IV fluid bolus.  Fetal wellbeing remained reassuring.  Bleeding transition to serosanguineous that appeared concerning for P PROM.  AmniSure obtained.  Repeat hemoglobin obtained  Results for orders placed or performed during the hospital encounter of 07/04/22 (from the past 24 hour(s))  Glucose, capillary     Status: Abnormal   Collection Time: 07/29/22  8:10 PM  Result Value Ref Range   Glucose-Capillary 127 (H) 70 - 99 mg/dL   Comment 1 Notify RN   Glucose, capillary     Status: None   Collection Time: 07/30/22  8:19 AM  Result Value Ref Range   Glucose-Capillary 88 70 - 99 mg/dL  CBC     Status: Abnormal   Collection Time: 07/30/22  8:56 AM  Result Value Ref Range   WBC 6.6 4.0 - 10.5 K/uL   RBC 3.05 (L) 3.87 - 5.11 MIL/uL   Hemoglobin 9.7 (L) 12.0 - 15.0 g/dL   HCT 27.8 (L) 36.0 - 46.0 %   MCV 91.1 80.0 - 100.0 fL   MCH 31.8 26.0 - 34.0 pg   MCHC 34.9 30.0 - 36.0 g/dL   RDW 14.6 11.5 - 15.5 %   Platelets 276 150 - 400 K/uL   nRBC 0.0 0.0 - 0.2 %  Amnisure rupture of membrane (rom)not at [facility]     Status: None   Collection Time: 07/30/22  9:45 AM  Result Value Ref Range   Amnisure ROM POSITIVE    AOX3, Anxious appearing Abd soft, NT/ND NEFG w/ old clot and serosanguinous  drainage FHR 150 reactive, cat 1 tracing Cvx deferred Toco quiet  Assessment and plan  1) Previa Type and screen  q 3 days, last 9/5 [place] with MFM q week, growth scan 9/6 (ordered) No accreta on MRI - recurrent episode of vaginal bleeding.  This appears to be the largest volume of bleeding since admission.    2) Suspect P PROM based on serosanguineous fluid continuing to drain.   - AmniSure collected and positive.  Will plan to  - Start IV antibiotics for latency 2) Anemia of pregnancy:  - Hemoglobin 9.7 this morning, was 9.7 yesterday morning.  Currently stable - Received venofer infusion x 2 now with second dose 9/3 3) IV Access:  - Patient is a 22-gauge IV in her left arm.  IV team was called to place a larger bore IV.  This was placed without difficulty.  Long discussion with patient and her husband about the possibility of a placement of a PICC line given patient has recurrent IV infiltrations of her arms.  Patient reluctant to proceed at this time and will consider. 4) mode of delivery, planned C-section.   - Consent obtained this morning. 5) patient was scheduled for a  growth ultrasound this morning with MFM.  This was postponed due to acute bleeding episode.  Patient now stable.  We will have ultrasound performed at bedside if possible. 6) A1DM: well controlled 7) Anxiety: zoloft

## 2022-07-31 ENCOUNTER — Inpatient Hospital Stay (HOSPITAL_BASED_OUTPATIENT_CLINIC_OR_DEPARTMENT_OTHER): Payer: BC Managed Care – PPO

## 2022-07-31 DIAGNOSIS — O34219 Maternal care for unspecified type scar from previous cesarean delivery: Secondary | ICD-10-CM

## 2022-07-31 DIAGNOSIS — O42913 Preterm premature rupture of membranes, unspecified as to length of time between rupture and onset of labor, third trimester: Secondary | ICD-10-CM

## 2022-07-31 DIAGNOSIS — E039 Hypothyroidism, unspecified: Secondary | ICD-10-CM

## 2022-07-31 DIAGNOSIS — O4413 Placenta previa with hemorrhage, third trimester: Secondary | ICD-10-CM

## 2022-07-31 DIAGNOSIS — O99283 Endocrine, nutritional and metabolic diseases complicating pregnancy, third trimester: Secondary | ICD-10-CM | POA: Diagnosis not present

## 2022-07-31 DIAGNOSIS — O09523 Supervision of elderly multigravida, third trimester: Secondary | ICD-10-CM

## 2022-07-31 DIAGNOSIS — Z3A32 32 weeks gestation of pregnancy: Secondary | ICD-10-CM

## 2022-07-31 LAB — GLUCOSE, CAPILLARY
Glucose-Capillary: 93 mg/dL (ref 70–99)
Glucose-Capillary: 102 mg/dL — ABNORMAL HIGH (ref 70–99)

## 2022-07-31 MED ORDER — BETAMETHASONE SOD PHOS & ACET 6 (3-3) MG/ML IJ SUSP
12.0000 mg | Freq: Once | INTRAMUSCULAR | Status: AC
  Administered 2022-07-31: 12 mg via INTRAMUSCULAR
  Filled 2022-07-31: qty 5

## 2022-07-31 NOTE — Progress Notes (Addendum)
Patient ID: Brandi [name], female   DOB: 01/30/1981, 41 y.o.   MRN: [id number] HD #28 placenta previa/bleeding/possible PPROM  Events of last 24 hours reviewed.   Pt with moderate bleed yesterday AM and additional larger clot yesterday PM.  She continues to have some light bleeding with wiping and mild cramping.  She is unsure if leaking fluid because feels damp most of time FHR category 1  Fundus firm and NT  Growth [place] yesterday: 43%ile vtx 1906g (4#3oz)  AFI 18   1) Previa Type and screen  q 3 days, due tomorrow and will get repeat CBC as well No accreta on MRI MFM following  Pt with increased small volume bleeding episodes over last 24 hours so betamethasone boostered today   2) Suspected PPROM based on serosanguineous fluid and +amniosure yesterday   Currently on latency antibiotics [place] for AFI today 14 ,  3) Anemia of pregnancy:  - Hemoglobin 9.7 yesterday after bleeding - Received venofer infusion x 2 now with second dose 9/3, will check CBC with type and screen tomorrow  4) IV Access:   Currently has two IV's in place PICC line discussed yesterday but access currently adequate  5)  A1DM: well controlled, will expect slight bump after betamethasone  6) Anxiety: zoloft  7) d/w Dr. Donalee Citrin and he feels delivery at 34 weeks warranted if remains stable given bleeding episodes and now possible PROM.  Will tentatively schedule c-section for 34 weeks and proceed sooner if indicated.  (Pt states her LMP was an estimate and MFM has been using her EDD of 09/24/22 based on early [place] making her 32 1/7 weeks today)

## 2022-08-01 ENCOUNTER — Inpatient Hospital Stay (HOSPITAL_COMMUNITY): Payer: BC Managed Care – PPO | Admitting: Certified Registered Nurse Anesthetist

## 2022-08-01 ENCOUNTER — Encounter (HOSPITAL_COMMUNITY): Payer: Self-pay | Admitting: Obstetrics and Gynecology

## 2022-08-01 ENCOUNTER — Other Ambulatory Visit: Payer: Self-pay

## 2022-08-01 ENCOUNTER — Encounter (HOSPITAL_COMMUNITY)
Admission: AD | Disposition: A | Discharge status: Home / Self Care | Payer: Self-pay | Source: Ambulatory Visit | Attending: Obstetrics and Gynecology

## 2022-08-01 DIAGNOSIS — O99284 Endocrine, nutritional and metabolic diseases complicating childbirth: Secondary | ICD-10-CM

## 2022-08-01 DIAGNOSIS — Z90710 Acquired absence of both cervix and uterus: Principal | ICD-10-CM | POA: Diagnosis present

## 2022-08-01 DIAGNOSIS — Z3A32 32 weeks gestation of pregnancy: Secondary | ICD-10-CM

## 2022-08-01 DIAGNOSIS — O43213 Placenta accreta, third trimester: Secondary | ICD-10-CM

## 2022-08-01 DIAGNOSIS — O34211 Maternal care for low transverse scar from previous cesarean delivery: Secondary | ICD-10-CM

## 2022-08-01 DIAGNOSIS — O09523 Supervision of elderly multigravida, third trimester: Secondary | ICD-10-CM

## 2022-08-01 DIAGNOSIS — O4413 Placenta previa with hemorrhage, third trimester: Secondary | ICD-10-CM

## 2022-08-01 HISTORY — PX: ABDOMINAL HYSTERECTOMY: SHX81

## 2022-08-01 LAB — CBC WITH DIFFERENTIAL/PLATELET
Abs Immature Granulocytes: 0.03 10*3/uL (ref 0.00–0.07)
Abs Immature Granulocytes: 0.1 10*3/uL — ABNORMAL HIGH (ref 0.00–0.07)
Basophils Absolute: 0 10*3/uL (ref 0.0–0.1)
Basophils Absolute: 0 10*3/uL (ref 0.0–0.1)
Basophils Relative: 0 %
Basophils Relative: 0 %
Eosinophils Absolute: 0 10*3/uL (ref 0.0–0.5)
Eosinophils Absolute: 0 10*3/uL (ref 0.0–0.5)
Eosinophils Relative: 0 %
Eosinophils Relative: 0 %
HCT: 24.2 % — ABNORMAL LOW (ref 36.0–46.0)
HCT: 24.3 % — ABNORMAL LOW (ref 36.0–46.0)
Hemoglobin: 8.3 g/dL — ABNORMAL LOW (ref 12.0–15.0)
Hemoglobin: 8.8 g/dL — ABNORMAL LOW (ref 12.0–15.0)
Immature Granulocytes: 0 %
Immature Granulocytes: 1 %
Lymphocytes Relative: 11 %
Lymphocytes Relative: 2 %
Lymphs Abs: 0.4 10*3/uL — ABNORMAL LOW (ref 0.7–4.0)
Lymphs Abs: 0.9 10*3/uL (ref 0.7–4.0)
MCH: 30.9 pg (ref 26.0–34.0)
MCH: 31.7 pg (ref 26.0–34.0)
MCHC: 34.3 g/dL (ref 30.0–36.0)
MCHC: 36.2 g/dL — ABNORMAL HIGH (ref 30.0–36.0)
MCV: 85.3 fL (ref 80.0–100.0)
MCV: 92.4 fL (ref 80.0–100.0)
Monocytes Absolute: 0.2 10*3/uL (ref 0.1–1.0)
Monocytes Absolute: 0.6 10*3/uL (ref 0.1–1.0)
Monocytes Relative: 2 %
Monocytes Relative: 3 %
Neutro Abs: 16.8 10*3/uL — ABNORMAL HIGH (ref 1.7–7.7)
Neutro Abs: 6.6 10*3/uL (ref 1.7–7.7)
Neutrophils Relative %: 87 %
Neutrophils Relative %: 94 %
Platelets: 117 10*3/uL — ABNORMAL LOW (ref 150–400)
Platelets: 262 10*3/uL (ref 150–400)
RBC: 2.62 MIL/uL — ABNORMAL LOW (ref 3.87–5.11)
RBC: 2.85 MIL/uL — ABNORMAL LOW (ref 3.87–5.11)
RDW: 13.8 % (ref 11.5–15.5)
RDW: 14.6 % (ref 11.5–15.5)
WBC: 17.9 10*3/uL — ABNORMAL HIGH (ref 4.0–10.5)
WBC: 7.6 10*3/uL (ref 4.0–10.5)
nRBC: 0 % (ref 0.0–0.2)
nRBC: 0 % (ref 0.0–0.2)

## 2022-08-01 LAB — PREPARE CRYOPRECIPITATE: Unit division: 0

## 2022-08-01 LAB — BASIC METABOLIC PANEL
Anion gap: 9 (ref 5–15)
Anion gap: 9 (ref 5–15)
BUN: 9 mg/dL (ref 6–20)
BUN: 10 mg/dL (ref 6–20)
CO2: 20 mmol/L — ABNORMAL LOW (ref 22–32)
CO2: 18 mmol/L — ABNORMAL LOW (ref 22–32)
Calcium: 8.8 mg/dL — ABNORMAL LOW (ref 8.9–10.3)
Calcium: 7.7 mg/dL — ABNORMAL LOW (ref 8.9–10.3)
Chloride: 107 mmol/L (ref 98–111)
Chloride: 110 mmol/L (ref 98–111)
Creatinine, Ser: 0.54 mg/dL (ref 0.44–1.00)
Creatinine, Ser: 0.59 mg/dL (ref 0.44–1.00)
GFR, Estimated: >60 mL/min (ref >60)
GFR, Estimated: >60 mL/min (ref >60)
Glucose, Bld: 176 mg/dL — ABNORMAL HIGH (ref 70–99)
Glucose, Bld: 232 mg/dL — ABNORMAL HIGH (ref 70–99)
Potassium: 3.2 mmol/L — ABNORMAL LOW (ref 3.5–5.1)
Potassium: 3.5 mmol/L (ref 3.5–5.1)
Sodium: 136 mmol/L (ref 135–145)
Sodium: 137 mmol/L (ref 135–145)

## 2022-08-01 LAB — DIC (DISSEMINATED INTRAVASCULAR COAGULATION)PANEL
D-Dimer, Quant: 1.54 ug/mL-FEU — ABNORMAL HIGH (ref 0.00–0.50)
D-Dimer, Quant: 1.1 ug/mL-FEU — ABNORMAL HIGH (ref 0.00–0.50)
Fibrinogen: 211 mg/dL (ref 210–475)
Fibrinogen: 217 mg/dL (ref 210–475)
INR: 1.5 — ABNORMAL HIGH (ref 0.8–1.2)
INR: 1.4 — ABNORMAL HIGH (ref 0.8–1.2)
Platelets: 120 10*3/uL — ABNORMAL LOW (ref 150–400)
Platelets: 123 10*3/uL — ABNORMAL LOW (ref 150–400)
Prothrombin Time: 17.5 seconds — ABNORMAL HIGH (ref 11.4–15.2)
Prothrombin Time: 17.1 seconds — ABNORMAL HIGH (ref 11.4–15.2)
Smear Review: NONE SEEN
Smear Review: NONE SEEN
aPTT: 27 seconds (ref 24–36)
aPTT: 27 seconds (ref 24–36)

## 2022-08-01 LAB — CBC
HCT: 23.2 % — ABNORMAL LOW (ref 36.0–46.0)
Hemoglobin: 8.4 g/dL — ABNORMAL LOW (ref 12.0–15.0)
MCH: 30.7 pg (ref 26.0–34.0)
MCHC: 36.2 g/dL — ABNORMAL HIGH (ref 30.0–36.0)
MCV: 84.7 fL (ref 80.0–100.0)
Platelets: 114 10*3/uL — ABNORMAL LOW (ref 150–400)
RBC: 2.74 MIL/uL — ABNORMAL LOW (ref 3.87–5.11)
RDW: 14 % (ref 11.5–15.5)
WBC: 14.8 10*3/uL — ABNORMAL HIGH (ref 4.0–10.5)
nRBC: 0 % (ref 0.0–0.2)

## 2022-08-01 LAB — BPAM CRYOPRECIPITATE
Blood Product Expiration Date: 202309080817
ISSUE DATE / TIME: 202309080231
Unit Type and Rh: 6200

## 2022-08-01 LAB — PREPARE RBC (CROSSMATCH)

## 2022-08-01 LAB — BLOOD PRODUCT ORDER (VERBAL) VERIFICATION

## 2022-08-01 LAB — MASSIVE TRANSFUSION PROTOCOL ORDER (BLOOD BANK NOTIFICATION)

## 2022-08-01 SURGERY — Surgical Case
Anesthesia: Spinal

## 2022-08-01 MED ORDER — MORPHINE SULFATE (PF) 0.5 MG/ML IJ SOLN
INTRAMUSCULAR | Status: DC | PRN
  Administered 2022-08-01: .15 mg via INTRATHECAL

## 2022-08-01 MED ORDER — DIBUCAINE (PERIANAL) 1 % EX OINT: 1.0000 | TOPICAL_OINTMENT | CUTANEOUS | Status: DC | PRN

## 2022-08-01 MED ORDER — COCONUT OIL OIL
1.0000 | TOPICAL_OIL | Status: DC | PRN
Start: 2022-08-01 — End: 2022-08-05
  Administered 2022-08-01 – 2022-08-05 (×2): 1 via TOPICAL

## 2022-08-01 MED ORDER — MENTHOL 3 MG MT LOZG: 1.0000 | LOZENGE | OROMUCOSAL | Status: DC | PRN

## 2022-08-01 MED ORDER — OXYCODONE HCL 5 MG PO TABS: 5.0000 mg | ORAL_TABLET | Freq: Once | ORAL | Status: DC | PRN

## 2022-08-01 MED ORDER — CALCIUM CHLORIDE 10 % IV SOLN
INTRAVENOUS | Status: AC
  Filled 2022-08-01: qty 10

## 2022-08-01 MED ORDER — MIDAZOLAM HCL 2 MG/2ML IJ SOLN
INTRAMUSCULAR | Status: DC | PRN
  Administered 2022-08-01: 2 mg via INTRAVENOUS

## 2022-08-01 MED ORDER — FENTANYL CITRATE (PF) 100 MCG/2ML IJ SOLN
INTRAMUSCULAR | Status: AC
  Filled 2022-08-01: qty 2

## 2022-08-01 MED ORDER — CEFAZOLIN SODIUM-DEXTROSE 2-3 GM-%(50ML) IV SOLR
INTRAVENOUS | Status: DC | PRN
  Administered 2022-08-01 (×2): 2 g via INTRAVENOUS

## 2022-08-01 MED ORDER — MORPHINE SULFATE (PF) 0.5 MG/ML IJ SOLN
INTRAMUSCULAR | Status: AC
  Filled 2022-08-01: qty 10

## 2022-08-01 MED ORDER — DEXAMETHASONE SODIUM PHOSPHATE 4 MG/ML IJ SOLN
INTRAMUSCULAR | Status: DC | PRN
  Administered 2022-08-01: 4 mg via INTRAVENOUS

## 2022-08-01 MED ORDER — TRANEXAMIC ACID-NACL 1000-0.7 MG/100ML-% IV SOLN
INTRAVENOUS | Status: AC
  Filled 2022-08-01: qty 100

## 2022-08-01 MED ORDER — DIPHENHYDRAMINE HCL 25 MG PO CAPS: 25.0000 mg | ORAL_CAPSULE | Freq: Four times a day (QID) | ORAL | Status: DC | PRN

## 2022-08-01 MED ORDER — CEFAZOLIN SODIUM-DEXTROSE 2-4 GM/100ML-% IV SOLN
INTRAVENOUS | Status: AC
  Filled 2022-08-01: qty 100

## 2022-08-01 MED ORDER — FENTANYL CITRATE (PF) 100 MCG/2ML IJ SOLN
INTRAMUSCULAR | Status: DC | PRN
  Administered 2022-08-01: 15 ug via INTRATHECAL

## 2022-08-01 MED ORDER — SENNOSIDES-DOCUSATE SODIUM 8.6-50 MG PO TABS
2.0000 | ORAL_TABLET | Freq: Every day | ORAL | Status: DC
  Administered 2022-08-02 – 2022-08-05 (×4): 2 via ORAL
  Filled 2022-08-01 (×4): qty 2

## 2022-08-01 MED ORDER — NALOXONE HCL 0.4 MG/ML IJ SOLN: 0.4000 mg | INTRAMUSCULAR | Status: DC | PRN

## 2022-08-01 MED ORDER — LIDOCAINE-EPINEPHRINE 2 %-1:100000 IJ SOLN: INTRAMUSCULAR | Status: DC | PRN

## 2022-08-01 MED ORDER — SIMETHICONE 80 MG PO CHEW
80.0000 mg | CHEWABLE_TABLET | Freq: Three times a day (TID) | ORAL | Status: DC
  Administered 2022-08-01 – 2022-08-05 (×13): 80 mg via ORAL
  Filled 2022-08-01 (×13): qty 1

## 2022-08-01 MED ORDER — SCOPOLAMINE 1 MG/3DAYS TD PT72: 1.0000 | MEDICATED_PATCH | Freq: Once | TRANSDERMAL | Status: DC

## 2022-08-01 MED ORDER — LACTATED RINGERS IV SOLN: INTRAVENOUS | Status: DC | PRN

## 2022-08-01 MED ORDER — ENOXAPARIN SODIUM 40 MG/0.4ML IJ SOSY
40.0000 mg | PREFILLED_SYRINGE | INTRAMUSCULAR | Status: DC
  Administered 2022-08-02 – 2022-08-05 (×3): 40 mg via SUBCUTANEOUS
  Filled 2022-08-01 (×4): qty 0.4

## 2022-08-01 MED ORDER — MIDAZOLAM HCL 2 MG/2ML IJ SOLN
INTRAMUSCULAR | Status: AC
  Filled 2022-08-01: qty 2

## 2022-08-01 MED ORDER — SODIUM CHLORIDE 0.9 % IV SOLN: INTRAVENOUS | Status: DC | PRN

## 2022-08-01 MED ORDER — TRANEXAMIC ACID-NACL 1000-0.7 MG/100ML-% IV SOLN
INTRAVENOUS | Status: DC | PRN
  Administered 2022-08-01: 1000 mg via INTRAVENOUS

## 2022-08-01 MED ORDER — SODIUM CHLORIDE 0.9 % IR SOLN
Status: DC | PRN
  Administered 2022-08-01: 1000 mL

## 2022-08-01 MED ORDER — ACETAMINOPHEN 10 MG/ML IV SOLN
INTRAVENOUS | Status: AC
  Filled 2022-08-01: qty 100

## 2022-08-01 MED ORDER — MEPERIDINE HCL 25 MG/ML IJ SOLN: 6.2500 mg | INTRAMUSCULAR | Status: DC | PRN

## 2022-08-01 MED ORDER — FENTANYL CITRATE (PF) 100 MCG/2ML IJ SOLN
25.0000 ug | INTRAMUSCULAR | Status: DC | PRN
  Administered 2022-08-01: 25 ug via INTRAVENOUS
  Filled 2022-08-01: qty 2

## 2022-08-01 MED ORDER — KETOROLAC TROMETHAMINE 30 MG/ML IJ SOLN
30.0000 mg | Freq: Four times a day (QID) | INTRAMUSCULAR | Status: AC | PRN
  Administered 2022-08-01: 30 mg via INTRAVENOUS
  Filled 2022-08-01: qty 1

## 2022-08-01 MED ORDER — ALBUMIN HUMAN 5 % IV SOLN: INTRAVENOUS | Status: DC | PRN

## 2022-08-01 MED ORDER — PHENYLEPHRINE HCL (PRESSORS) 10 MG/ML IV SOLN
INTRAVENOUS | Status: DC | PRN
  Administered 2022-08-01 (×2): 240 ug via INTRAVENOUS
  Administered 2022-08-01: 160 ug via INTRAVENOUS
  Administered 2022-08-01: 240 ug via INTRAVENOUS
  Administered 2022-08-01: 80 ug via INTRAVENOUS
  Administered 2022-08-01: 160 ug via INTRAVENOUS
  Administered 2022-08-01 (×2): 240 ug via INTRAVENOUS

## 2022-08-01 MED ORDER — ACETAMINOPHEN 500 MG PO TABS
1000.0000 mg | ORAL_TABLET | Freq: Four times a day (QID) | ORAL | Status: DC
  Administered 2022-08-01 – 2022-08-05 (×15): 1000 mg via ORAL
  Filled 2022-08-01 (×16): qty 2

## 2022-08-01 MED ORDER — ONDANSETRON HCL 4 MG/2ML IJ SOLN
INTRAMUSCULAR | Status: AC
  Filled 2022-08-01: qty 2

## 2022-08-01 MED ORDER — PRENATAL MULTIVITAMIN CH
1.0000 | ORAL_TABLET | Freq: Every day | ORAL | Status: DC
  Administered 2022-08-01 – 2022-08-04 (×4): 1 via ORAL
  Filled 2022-08-01 (×4): qty 1

## 2022-08-01 MED ORDER — BUPIVACAINE IN DEXTROSE 0.75-8.25 % IT SOLN
INTRATHECAL | Status: DC | PRN
  Administered 2022-08-01: 1.6 mL via INTRATHECAL

## 2022-08-01 MED ORDER — KETOROLAC TROMETHAMINE 30 MG/ML IJ SOLN
30.0000 mg | Freq: Four times a day (QID) | INTRAMUSCULAR | Status: AC | PRN
Start: 2022-08-01 — End: 2022-08-02

## 2022-08-01 MED ORDER — SODIUM CHLORIDE 0.9% FLUSH
3.0000 mL | INTRAVENOUS | Status: DC | PRN
  Administered 2022-08-01: 3 mL via INTRAVENOUS

## 2022-08-01 MED ORDER — PHENYLEPHRINE HCL-NACL 20-0.9 MG/250ML-% IV SOLN
INTRAVENOUS | Status: AC
  Filled 2022-08-01: qty 250

## 2022-08-01 MED ORDER — DIPHENHYDRAMINE HCL 25 MG PO CAPS: 25.0000 mg | ORAL_CAPSULE | ORAL | Status: DC | PRN

## 2022-08-01 MED ORDER — OXYCODONE HCL 5 MG/5ML PO SOLN: 5.0000 mg | Freq: Once | ORAL | Status: DC | PRN

## 2022-08-01 MED ORDER — FENTANYL CITRATE (PF) 100 MCG/2ML IJ SOLN: INTRAMUSCULAR | Status: DC | PRN

## 2022-08-01 MED ORDER — CEFAZOLIN SODIUM-DEXTROSE 1-4 GM/50ML-% IV SOLN
1.0000 g | Freq: Three times a day (TID) | INTRAVENOUS | Status: AC
  Administered 2022-08-01 (×2): 1 g via INTRAVENOUS
  Filled 2022-08-01 (×3): qty 50

## 2022-08-01 MED ORDER — STERILE WATER FOR IRRIGATION IR SOLN
Status: DC | PRN
  Administered 2022-08-01: 1000 mL

## 2022-08-01 MED ORDER — OXYTOCIN-SODIUM CHLORIDE 30-0.9 UT/500ML-% IV SOLN
INTRAVENOUS | Status: DC | PRN
  Administered 2022-08-01: 400 mL via INTRAVENOUS

## 2022-08-01 MED ORDER — OXYTOCIN-SODIUM CHLORIDE 30-0.9 UT/500ML-% IV SOLN: 2.5000 [IU]/h | INTRAVENOUS | Status: AC

## 2022-08-01 MED ORDER — ACETAMINOPHEN 160 MG/5ML PO SOLN: 325.0000 mg | ORAL | Status: DC | PRN

## 2022-08-01 MED ORDER — VASOPRESSIN 20 UNIT/ML IV SOLN
INTRAVENOUS | Status: DC | PRN
  Administered 2022-08-01 (×2): 1 [IU] via INTRAVENOUS

## 2022-08-01 MED ORDER — PROMETHAZINE HCL 25 MG/ML IJ SOLN: 6.2500 mg | INTRAMUSCULAR | Status: DC | PRN

## 2022-08-01 MED ORDER — ONDANSETRON HCL 4 MG/2ML IJ SOLN: 4.0000 mg | Freq: Three times a day (TID) | INTRAMUSCULAR | Status: DC | PRN

## 2022-08-01 MED ORDER — SODIUM CHLORIDE 0.9% IV SOLUTION: Freq: Once | INTRAVENOUS | Status: DC

## 2022-08-01 MED ORDER — OXYCODONE HCL 5 MG PO TABS
5.0000 mg | ORAL_TABLET | ORAL | Status: DC | PRN
  Administered 2022-08-01 – 2022-08-05 (×12): 5 mg via ORAL
  Administered 2022-08-05: 10 mg via ORAL
  Administered 2022-08-05: 5 mg via ORAL
  Filled 2022-08-01: qty 2
  Filled 2022-08-01 (×4): qty 1
  Filled 2022-08-01: qty 2
  Filled 2022-08-01 (×8): qty 1

## 2022-08-01 MED ORDER — LIDOCAINE-EPINEPHRINE 2 %-1:100000 IJ SOLN
INTRAMUSCULAR | Status: DC | PRN
  Administered 2022-08-01 (×2): 5 mL via INTRADERMAL

## 2022-08-01 MED ORDER — ONDANSETRON HCL 4 MG/2ML IJ SOLN
INTRAMUSCULAR | Status: DC | PRN
  Administered 2022-08-01: 4 mg via INTRAVENOUS

## 2022-08-01 MED ORDER — SIMETHICONE 80 MG PO CHEW: 80.0000 mg | CHEWABLE_TABLET | ORAL | Status: DC | PRN

## 2022-08-01 MED ORDER — OXYTOCIN-SODIUM CHLORIDE 30-0.9 UT/500ML-% IV SOLN
INTRAVENOUS | Status: AC
  Filled 2022-08-01: qty 500

## 2022-08-01 MED ORDER — SODIUM CHLORIDE 0.9 % IV SOLN
INTRAVENOUS | Status: DC | PRN
  Administered 2022-08-01: 500 mg via INTRAVENOUS

## 2022-08-01 MED ORDER — DEXAMETHASONE SODIUM PHOSPHATE 4 MG/ML IJ SOLN
INTRAMUSCULAR | Status: AC
  Filled 2022-08-01: qty 1

## 2022-08-01 MED ORDER — TETANUS-DIPHTH-ACELL PERTUSSIS 5-2.5-18.5 LF-MCG/0.5 IM SUSY: 0.5000 mL | PREFILLED_SYRINGE | Freq: Once | INTRAMUSCULAR | Status: DC

## 2022-08-01 MED ORDER — CALCIUM CHLORIDE 10 % IV SOLN
INTRAVENOUS | Status: DC | PRN
  Administered 2022-08-01 (×2): 250 mg via INTRAVENOUS

## 2022-08-01 MED ORDER — FENTANYL CITRATE (PF) 100 MCG/2ML IJ SOLN: 25.0000 ug | INTRAMUSCULAR | Status: DC | PRN

## 2022-08-01 MED ORDER — ZOLPIDEM TARTRATE 5 MG PO TABS: 5.0000 mg | ORAL_TABLET | Freq: Every evening | ORAL | Status: DC | PRN

## 2022-08-01 MED ORDER — NALOXONE HCL 4 MG/10ML IJ SOLN: 1.0000 ug/kg/h | INTRAVENOUS | Status: DC | PRN

## 2022-08-01 MED ORDER — SOD CITRATE-CITRIC ACID 500-334 MG/5ML PO SOLN
ORAL | Status: AC
  Administered 2022-08-01: 30 mL
  Filled 2022-08-01: qty 30

## 2022-08-01 MED ORDER — ACETAMINOPHEN 10 MG/ML IV SOLN
1000.0000 mg | Freq: Once | INTRAVENOUS | Status: DC | PRN
  Administered 2022-08-01: 1000 mg via INTRAVENOUS

## 2022-08-01 MED ORDER — IBUPROFEN 600 MG PO TABS
600.0000 mg | ORAL_TABLET | Freq: Four times a day (QID) | ORAL | Status: AC
  Administered 2022-08-01 – 2022-08-04 (×11): 600 mg via ORAL
  Filled 2022-08-01 (×12): qty 1

## 2022-08-01 MED ORDER — PHENYLEPHRINE HCL-NACL 20-0.9 MG/250ML-% IV SOLN
INTRAVENOUS | Status: DC | PRN
  Administered 2022-08-01: 60 ug/min via INTRAVENOUS

## 2022-08-01 MED ORDER — DIPHENHYDRAMINE HCL 50 MG/ML IJ SOLN: 12.5000 mg | INTRAMUSCULAR | Status: DC | PRN

## 2022-08-01 MED ORDER — ACETAMINOPHEN 325 MG PO TABS: 325.0000 mg | ORAL_TABLET | ORAL | Status: DC | PRN

## 2022-08-01 MED ORDER — WITCH HAZEL-GLYCERIN EX PADS: 1.0000 | MEDICATED_PAD | CUTANEOUS | Status: DC | PRN

## 2022-08-01 SURGICAL SUPPLY — 45 items
BENZOIN TINCTURE PRP APPL 2/3 (GAUZE/BANDAGES/DRESSINGS) IMPLANT
CHLORAPREP W/TINT 26ML (MISCELLANEOUS) ×4 IMPLANT
CLAMP CORD UMBIL (MISCELLANEOUS) ×2 IMPLANT
CLOTH BEACON ORANGE TIMEOUT ST (SAFETY) ×2 IMPLANT
DERMABOND ADVANCED (GAUZE/BANDAGES/DRESSINGS)
DERMABOND ADVANCED .7 DNX12 (GAUZE/BANDAGES/DRESSINGS) IMPLANT
DRSG OPSITE POSTOP 4X10 (GAUZE/BANDAGES/DRESSINGS) ×2 IMPLANT
DRSG OPSITE POSTOP 4X12 (GAUZE/BANDAGES/DRESSINGS) IMPLANT
ELECT REM PT RETURN 9FT ADLT (ELECTROSURGICAL) ×2
ELECTRODE REM PT RTRN 9FT ADLT (ELECTROSURGICAL) ×2 IMPLANT
EXTRACTOR VACUUM KIWI (MISCELLANEOUS) IMPLANT
GLOVE BIO SURGEON STRL SZ7 (GLOVE) ×2 IMPLANT
GLOVE BIO SURGEON STRL SZ7.5 (GLOVE) IMPLANT
GLOVE BIO SURGEON STRL SZ8 (GLOVE) IMPLANT
GLOVE BIOGEL PI IND STRL 7.0 (GLOVE) ×2 IMPLANT
GOWN STRL REUS W/TWL LRG LVL3 (GOWN DISPOSABLE) ×4 IMPLANT
KIT ABG SYR 3ML LUER SLIP (SYRINGE) IMPLANT
NDL HYPO 25X5/8 SAFETYGLIDE (NEEDLE) IMPLANT
NEEDLE HYPO 22GX1.5 SAFETY (NEEDLE) IMPLANT
NEEDLE HYPO 25X5/8 SAFETYGLIDE (NEEDLE) IMPLANT
NS IRRIG 1000ML POUR BTL (IV SOLUTION) ×2 IMPLANT
PACK C SECTION WH (CUSTOM PROCEDURE TRAY) ×2 IMPLANT
PAD OB MATERNITY 4.3X12.25 (PERSONAL CARE ITEMS) ×2 IMPLANT
RETRACTOR WND ALEXIS 25 LRG (MISCELLANEOUS) IMPLANT
RTRCTR C-SECT PINK 25CM LRG (MISCELLANEOUS) ×2 IMPLANT
RTRCTR WOUND ALEXIS 25CM LRG (MISCELLANEOUS) ×2
STRIP CLOSURE SKIN 1/2X4 (GAUZE/BANDAGES/DRESSINGS) IMPLANT
SUT CHROMIC 1 CTX 36 (SUTURE) ×4 IMPLANT
SUT CHROMIC 2 0 CT 1 (SUTURE) ×2 IMPLANT
SUT MON AB 2-0 CT1 36 (SUTURE) IMPLANT
SUT MON AB 3-0 SH 27 (SUTURE) ×2
SUT MON AB 3-0 SH27 (SUTURE) IMPLANT
SUT PDS AB 0 CTX 60 (SUTURE) ×2 IMPLANT
SUT VIC AB 0 CT1 27 (SUTURE) ×6
SUT VIC AB 0 CT1 27XCR 8 STRN (SUTURE) IMPLANT
SUT VIC AB 0 CTX 36 (SUTURE) ×8
SUT VIC AB 0 CTX36XBRD ANBCTRL (SUTURE) IMPLANT
SUT VIC AB 2-0 CT1 27 (SUTURE) ×2
SUT VIC AB 2-0 CT1 TAPERPNT 27 (SUTURE) ×2 IMPLANT
SUT VIC AB 4-0 KS 27 (SUTURE) IMPLANT
SYR 30ML LL (SYRINGE) IMPLANT
TOWEL OR 17X24 6PK STRL BLUE (TOWEL DISPOSABLE) ×2 IMPLANT
TRAY FOLEY W/BAG SLVR 14FR LF (SET/KITS/TRAYS/PACK) ×2 IMPLANT
WATER STERILE IRR 1000ML POUR (IV SOLUTION) ×2 IMPLANT
YANKAUER SUCT BULB TIP NO VENT (SUCTIONS) IMPLANT

## 2022-08-01 NOTE — Progress Notes (Signed)
Patient ID: Brandi [name], female   DOB: [birth year], 41 y.o.   MRN: [id number] Hemoglobin 8.3  Type and crossed for 2 units

## 2022-08-01 NOTE — Progress Notes (Signed)
Patient ID: Brandi [name], female   DOB: 12-Sep-1981, 41 y.o.   MRN: [id number] Pt observed in PACU for several hours after surgery completed. Tachycardia resolved and BP low normal.  Labs drawn immediately post op with Hgb of 8.8 and Platelets 117K. PT and INR minimally elevated. Repeat labs done 2 hours later and were stable.    Pt to go to antepartum.  D/w her advancing diet slowly to avoid an ileus and that we will see later in day if can tolerate sitting up/walking.  UOP good and creatinine normal.  Will continue foley x 3 days given area of bladder denuded.

## 2022-08-01 NOTE — Transfer of Care (Signed)
Immediate Anesthesia Transfer of Care Note  Patient: Brandi [name]  Procedure(s) Performed: CESAREAN SECTION HYSTERECTOMY ABDOMINAL  Patient Location: PACU  Anesthesia Type:Spinal and Epidural  Level of Consciousness: awake, alert  and patient cooperative  Airway & Oxygen Therapy: Patient Spontanous Breathing  Post-op Assessment: Report given to RN and Post -op Vital signs reviewed and stable  Post vital signs: Reviewed and stable  Last Vitals:  Vitals Value Taken Time  BP 86/58 08/01/22 0400  Temp 37.1 C 08/01/22 0353  Pulse 117 08/01/22 0402  Resp 0 08/01/22 0402  SpO2 97 % 08/01/22 0402  Vitals shown include unvalidated device data.  Last Pain:  Vitals:   08/01/22 0353  TempSrc:   PainSc: 2       Patients Stated Pain Goal: 3 (67/01/41 0301)  Complications: No notable events documented.

## 2022-08-01 NOTE — Lactation Note (Signed)
This note was copied from a baby's chart.  NICU Lactation Consultation Note  Patient Name: Girl Vielka Klinedinst IYMEB'R Date: 08/01/2022 Age:42 hours  Subjective Reason for consult: Infant < 6lbs; NICU baby; Initial assessment; Preterm <34wks; Maternal endocrine disorder; Other (Comment)  Visited with family of 57 hours old pre-term NICU female, Ms. Boerema is a P3 and experienced breastfeeding but this is her first baby in NICU. Assisted with hand expression (no colostrum noted yet) and provided education regarding pumping and ways to get a DEBP for home use; she's unsure about the [facility] pump and will think about it prior her discharge. Resized her flanges to # 24, there was blanching around the nipple due to large flange size. Reviewed pumping schedule, lactogenesis II, benefits of premature milk and anticipatory guidelines.  Objective Infant data: Mother's Current Feeding Choice: -- (NPO)  Maternal data: [id number]  C-Section, Low Transverse Significant Breast History:: (++) breast changes during the pregnancy Current breast feeding challenges:: NICU admission Previous breastfeeding challenges?: Low milk supply Does the patient have breastfeeding experience prior to this delivery?: Yes How long did the patient breastfeed?: 1st for 9 months, 2nd one for 14 months Pumping frequency: q 3 hours (recommended) Pumped volume: 0 mL Flange Size: 24 Risk factor for low milk supply:: prematurity, AMA, blood loss of 3058 cc. due to C/S and hysterectomy Pump: Advised to call insurance company, Refer for rental (no pump at home, offered [employer] pump and provided info regarding rentals at gift shop)  Assessment Infant: Feeding Status: NPO  Maternal: Milk volume: Normal  Intervention/Plan Interventions: Breast feeding basics reviewed; Breast massage; Hand express; DEBP; Education; [employer] brochure Tools: Pump; Flanges Pump Education: Setup, frequency, and cleaning; Milk Storage  Plan of  care: Encouraged pumping every 3 hours, ideally 8 pumping sessions/24 hours Breast massage, hand expression and coconut oil were also encouraged prior pumping  FOB present and very supportive. All questions and concerns answered, family to contact [facility] services PRN.  Consult Status: NICU follow-up  NICU Follow-up type: Maternal D/C visit; New admission follow up; Verify onset of copious milk; Verify absence of engorgement   Peyson Delao S Fabion Gatson 08/01/2022, 2:39 PM

## 2022-08-01 NOTE — Progress Notes (Signed)
Patient ID: Brandi [name], female   DOB: Jun 20, 1981, 41 y.o.   MRN: [id number] FHR continues to be category 2 with dece4ls to 90's intermittently.  D/w pt and husband we need to proceed with delivery. OR and OB anesthesia notified.   STAT labs drawn  Will proceed when OR ready.

## 2022-08-01 NOTE — Anesthesia Preprocedure Evaluation (Addendum)
Anesthesia Evaluation  Patient identified by MRN, date of birth, ID band Patient awake    Reviewed: Allergy & Precautions, Patient's Chart, lab work & pertinent test results  Airway Mallampati: I       Dental  (+) Teeth Intact, Dental Advisory Given   Pulmonary asthma ,    Pulmonary exam normal        Cardiovascular negative cardio ROS Normal cardiovascular exam     Neuro/Psych  Headaches, Anxiety    GI/Hepatic negative GI ROS,   Endo/Other    Renal/GU      Musculoskeletal   Abdominal   Peds  Hematology   Anesthesia Other Findings   Reproductive/Obstetrics (+) Pregnancy                            Anesthesia Physical Anesthesia Plan  ASA: 3 and emergent  Anesthesia Plan: Combined Spinal and Epidural   Post-op Pain Management:    Induction:   PONV Risk Score and Plan: 1 and Ondansetron  Airway Management Planned: Natural Airway  Additional Equipment: None  Intra-op Plan:   Post-operative Plan:   Informed Consent:   Plan Discussed with: CRNA  Anesthesia Plan Comments: (Lab Results      Component                Value               Date                      WBC                      6.6                 07/30/2022                HGB                      9.7 (L)             07/30/2022                HCT                      27.8 (L)            07/30/2022                MCV                      91.1                07/30/2022                PLT                      276                 07/30/2022            Repeating labs )        Anesthesia Quick Evaluation

## 2022-08-01 NOTE — Anesthesia Postprocedure Evaluation (Signed)
Anesthesia Post Note  Patient: Brandi [name]  Procedure(s) Performed: CESAREAN SECTION HYSTERECTOMY ABDOMINAL     Patient location during evaluation: PACU Anesthesia Type: Spinal Level of consciousness: oriented and awake and alert Pain management: pain level controlled Vital Signs Assessment: post-procedure vital signs reviewed and stable Respiratory status: spontaneous breathing, respiratory function stable and nonlabored ventilation Cardiovascular status: blood pressure returned to baseline and stable Postop Assessment: no headache, no backache, no apparent nausea or vomiting, spinal receding and patient able to bend at knees Anesthetic complications: no   No notable events documented.  Last Vitals:  Vitals:   08/01/22 0630 08/01/22 0645  BP: 99/60 95/62  Pulse: 81 96  Resp: (!) 21 17  Temp:    SpO2: 100% 100%    Last Pain:  Vitals:   08/01/22 0645  TempSrc:   PainSc: 6    Pain Goal: Patients Stated Pain Goal: 3 (07/25/22 0828)  LLE Motor Response: Purposeful movement (08/01/22 0630) LLE Sensation: Tingling, Increased (08/01/22 0630) RLE Motor Response: Purposeful movement (08/01/22 0630) RLE Sensation: Tingling, Increased (08/01/22 0630)     Epidural/Spinal Function Cutaneous sensation: Able to Wiggle Toes (08/01/22 0630), Patient able to flex knees: Yes (08/01/22 0630), Patient able to lift hips off bed: No (08/01/22 0630), Back pain beyond tenderness at insertion site: No (08/01/22 0630), Progressively worsening motor and/or sensory loss: No (08/01/22 0630), Bowel and/or bladder incontinence post epidural: No (08/01/22 0630)  Asaf Elmquist A.

## 2022-08-01 NOTE — Op Note (Signed)
Operative Note  **Due to the complexity and emergent nature of the case Dr. Elonda Husky was needed to assist    Preoperative Diagnosis Preterm pregnancy at 32+ weeks Posterior placenta previa Possible SROM Category 2 tracing  Postoperative Diagnosis Occult cord by head Same as above with posterior placenta accreta and hemorrhage  Procedure Repeat low transverse c-section transitioned to cesarean hysterectomy  Surgeon Paula Compton, MD Darlis Loan, MD Karsten Fells, MD  Anesthesia Spinal/epidural  Fluids: EBL 302m UOP 1520mclear IVF 430068mR 5 units PRBC's 2 units FFP 1 unit of platelets   Findings The lower uterine segment of the uterus appeared normal anteriorly and there was a viable female infant in the vertex presentation with an occult cord by the head.  Apgars 7,8 Weight 4#12oz.  Normal ovaries and tubes. The placenta partially separated but was densely adherent to the posterior LUS over an area of at least 5cm.  It did not invade surrounding tissue and appeared confined to the uterus and possibly cervix.   Specimen Placenta, uterus  Procedure Note  Patient was taken to the operating room where spinal/epidural anesthesia was obtained and found to be adequate by Allis clamp test. She was prepped and draped in the normal sterile fashion in the dorsal supine position with a leftward tilt. An appropriate time out was performed. A Pfannenstiel skin incision was then made with the scalpel through a pre-existing scar and carried through to the underlying layer of fascia by sharp dissection and Bovie cautery. The fascia was nicked in the midline and the incision was extended laterally with Mayo scissors. The inferior aspect of the incision was grasped Coker clamps and dissected off the underlying rectus muscles. In a similar fashion the superior aspect was dissected off the rectus muscles. Rectus muscles were separated in the midline and the peritoneal cavity entered bluntly.  The peritoneal incision was then extended both superiorly and inferiorly with careful attention to avoid both bowel and bladder. The Alexis self-retaining wound retractor was then placed within the incision and the lower uterine segment exposed. The bladder flap was developed with Metzenbaum scissors and pushed away from the lower uterine segment. The lower uterine segment was then incised in a transverse fashion and the cavity itself entered bluntly. The incision was extended bluntly. The infant's head was then lifted and delivered from the incision without difficulty. The remainder of the infant delivered and the nose and mouth bulb suctioned with the cord clamped after 1 minute delay and cut as well. The infant was handed off to the waiting NICU team.The placenta was then starting to separate from the upper edge and partially delivered, but then the tail of it was densely adherent to the posterior lower uterine segment.  The separated placenta was then removed from the field to gain visualization and then the bed where it was adherent began to briskly bleed.  The uterus was delivered and pressure held at the neck of the fundus which controlled the bleeding while instruments for a c-hyst and another surgeon was acquired.  THe massive transfusion protocol was activated.   Once Dr. EurElonda Huskyrubbed in we began to clamp the broad ligament sequentially separating the uteroovarian ligament and continuing down to the apparent uterocervical junction.Each step was secured with a suture ligature of 0-vicryl.  Once the lower uterine segment was cross clamped, the fundus was removed and the area sutured.  Unfortunately there was still bleeding noted as the posterior vagina began to balloon out  with accumulating blood in  the upper vaginal vault.  An incision was made anterior and clot and placental tissue were evacuated.from the upper vault. Additional bites were taken to get below the placenta bed . This area was then secured  with multiple sutures of 0 vicryl.  Pressure was held on the area, and the pelvis observed for another 15 to 20 minutes with no active bleeding noted.  There was a 1cm area on the dome of the bladder where the serosa was denuded and this was over-sewn with 3-0 Monocryl,  All instruments and sponges as well as the Alexis retractor were then removed from the abdomen. The rectus muscles and peritoneum were then reapproximated with a running suture of 2-0 Vicryl. The fascia was then closed with 0 Vicryl in a running fashion. Subcutaneous tissue was reapproximated with 3-0 plain in a running fashion. The skin was closed with a subcuticular stitch of 4-0 Vicryl on a Keith needle and then reinforced with benzoin and Steri-Strips. At the conclusion of the procedure all instruments and sponge counts were correct. Patient was taken to the recovery room in stable condition and hemodynamically stable. The baby was stable in the NICU

## 2022-08-01 NOTE — Progress Notes (Signed)
Patient ID: Brandi [name], female   DOB: [birth year], 41 y.o.   MRN: [id number] Called to see patient for change in FHR tracing  Pt with no significant bleeding and no major contractions.  FHR with baseline of 120 but has had some intermittent repetitive prolonged decelerations to the 90's.  Good variability and some accels.  Baby is audibly moving.    D/w pt that while tracing is not dire necessitating an emergent c-section, it is a significant change. Pt has called husband to come and we will continue to observe closely.  If decelerations persist we will proceed with c-section.   D/w pt risks of c-section including bleeding, infection and possible damage to bowel and bladder.  Given the previa, we have had the discussion many times that in the event of heavy bleeding we would proceed with a cesarean hysterectomy and she is in agreement.  Stat labs ordered for CBC, BMP and T&S.  OR notified of situation.

## 2022-08-01 NOTE — Progress Notes (Signed)
POD #0 c-hyst Doing ok, pain getting a bit worse, just got back from seeing baby who is doing well, no n/v Afeb, VSS Abd- soft, dressing C/D/I Mild vag bleeding on pad  Continue current care, CBC in am, notify nurse for heavier bleeding

## 2022-08-01 NOTE — Anesthesia Procedure Notes (Signed)
Spinal  Start time: 08/01/2022 1:13 AM End time: 08/01/2022 1:15 AM Reason for block: surgical anesthesia Staffing Performed: anesthesiologist  Anesthesiologist: Effie Berkshire, MD Performed by: Effie Berkshire, MD Authorized by: Effie Berkshire, MD   Preanesthetic Checklist Completed: patient identified, IV checked, site marked, risks and benefits discussed, surgical consent, monitors and equipment checked, pre-op evaluation and timeout performed Spinal Block Patient position: sitting Prep: DuraPrep and site prepped and draped Location: L3-4 Injection technique: single-shot Needle Needle type: Pencan  Needle gauge: 24 G Needle length: 10 cm Needle insertion depth: 10 cm Assessment Events: CSF return Additional Notes Patient tolerated well. No immediate complications. CSE performed, epidural in place, not tested.   Functioning IV was confirmed and monitors were applied. Sterile prep and drape, including hand hygiene and sterile gloves were used. The patient was positioned and the back was prepped. The skin was anesthetized with lidocaine. Free flow of clear CSF was obtained prior to injecting local anesthetic into the CSF. The spinal needle aspirated freely following injection. The needle was carefully withdrawn. The patient tolerated the procedure well.

## 2022-08-02 LAB — PREPARE FRESH FROZEN PLASMA
Unit division: 0
Unit division: 0
Unit division: 0
Unit division: 0
Unit division: 0
Unit division: 0
Unit division: 0
Unit division: 0
Unit division: 0
Unit division: 0

## 2022-08-02 LAB — CBC
HCT: 18.8 % — ABNORMAL LOW (ref 36.0–46.0)
Hemoglobin: 6.8 g/dL — CL (ref 12.0–15.0)
MCH: 30.4 pg (ref 26.0–34.0)
MCHC: 36.2 g/dL — ABNORMAL HIGH (ref 30.0–36.0)
MCV: 83.9 fL (ref 80.0–100.0)
Platelets: 115 10*3/uL — ABNORMAL LOW (ref 150–400)
RBC: 2.24 MIL/uL — ABNORMAL LOW (ref 3.87–5.11)
RDW: 15.7 % — ABNORMAL HIGH (ref 11.5–15.5)
WBC: 7.9 10*3/uL (ref 4.0–10.5)
nRBC: 0 % (ref 0.0–0.2)

## 2022-08-02 LAB — BPAM FFP
Blood Product Expiration Date: 202309122359
Blood Product Expiration Date: 202309122359
Blood Product Expiration Date: 202309122359
Blood Product Expiration Date: 202309122359
Blood Product Expiration Date: 202309122359
Blood Product Expiration Date: 202309122359
Blood Product Expiration Date: 202309132359
Blood Product Expiration Date: 202309262359
Blood Product Expiration Date: 202309262359
Blood Product Expiration Date: 202309262359
Blood Product Expiration Date: 202309262359
Blood Product Expiration Date: 202309292359
ISSUE DATE / TIME: 202309080204
ISSUE DATE / TIME: 202309080204
ISSUE DATE / TIME: 202309080204
ISSUE DATE / TIME: 202309080204
ISSUE DATE / TIME: 202309080204
ISSUE DATE / TIME: 202309080204
ISSUE DATE / TIME: 202309081249
ISSUE DATE / TIME: 202309081249
ISSUE DATE / TIME: 202309081539
ISSUE DATE / TIME: 202309081539
ISSUE DATE / TIME: 202309090007
ISSUE DATE / TIME: 202309090007
Unit Type and Rh: 600
Unit Type and Rh: 600
Unit Type and Rh: 600
Unit Type and Rh: 6200
Unit Type and Rh: 6200
Unit Type and Rh: 6200
Unit Type and Rh: 6200
Unit Type and Rh: 6200
Unit Type and Rh: 6200
Unit Type and Rh: 6200
Unit Type and Rh: 6200
Unit Type and Rh: 6200

## 2022-08-02 LAB — CBC WITH DIFFERENTIAL/PLATELET
Abs Immature Granulocytes: 0.04 10*3/uL (ref 0.00–0.07)
Basophils Absolute: 0.1 10*3/uL (ref 0.0–0.1)
Basophils Relative: 1 %
Eosinophils Absolute: 0.1 10*3/uL (ref 0.0–0.5)
Eosinophils Relative: 1 %
HCT: 23.4 % — ABNORMAL LOW (ref 36.0–46.0)
Hemoglobin: 8.2 g/dL — ABNORMAL LOW (ref 12.0–15.0)
Immature Granulocytes: 1 %
Lymphocytes Relative: 16 %
Lymphs Abs: 1.4 10*3/uL (ref 0.7–4.0)
MCH: 30 pg (ref 26.0–34.0)
MCHC: 35 g/dL (ref 30.0–36.0)
MCV: 85.7 fL (ref 80.0–100.0)
Monocytes Absolute: 0.7 10*3/uL (ref 0.1–1.0)
Monocytes Relative: 8 %
Neutro Abs: 6.3 10*3/uL (ref 1.7–7.7)
Neutrophils Relative %: 73 %
Platelets: 154 10*3/uL (ref 150–400)
RBC: 2.73 MIL/uL — ABNORMAL LOW (ref 3.87–5.11)
RDW: 15.6 % — ABNORMAL HIGH (ref 11.5–15.5)
WBC: 8.6 10*3/uL (ref 4.0–10.5)
nRBC: 0 % (ref 0.0–0.2)

## 2022-08-02 LAB — PREPARE PLATELET PHERESIS: Unit division: 0

## 2022-08-02 LAB — BPAM PLATELET PHERESIS
Blood Product Expiration Date: 202309092359
ISSUE DATE / TIME: 202309080207
Unit Type and Rh: 6200

## 2022-08-02 LAB — PREPARE RBC (CROSSMATCH)

## 2022-08-02 LAB — GLUCOSE, CAPILLARY: Glucose-Capillary: 89 mg/dL (ref 70–99)

## 2022-08-02 MED ORDER — SODIUM CHLORIDE 0.9% IV SOLUTION: Freq: Once | INTRAVENOUS | Status: DC

## 2022-08-02 NOTE — Progress Notes (Signed)
  Patient is eating, ambulating, voiding.  Pain control is good.  Vitals:   08/01/22 1329 08/01/22 1517 08/01/22 1936 08/02/22 0100  BP: (!) 97/55 (!) 105/54 (!) 108/59 109/63  Pulse: (!) 101 76 77 83  Resp:  '18 18 18  '$ Temp:  97.9 F (36.6 C) 98.4 F (36.9 C) 98.4 F (36.9 C)  TempSrc:  Oral Oral Oral  SpO2:  100% 100% 100%  Weight:      Height:        lungs:   clear to auscultation cor:    RRR Abdomen:  soft, appropriate tenderness, incisions intact and without erythema or exudate ex:    no cords   Lab Results  Component Value Date   WBC 7.9 08/02/2022   HGB 6.8 (LL) 08/02/2022   HCT 18.8 (L) 08/02/2022   MCV 83.9 08/02/2022   PLT 115 (L) 08/02/2022    --/--/A POS (09/08 0035)/RI  A/P    Post operative day 1 from C-hyst for previa with accreta.   Anemia- pt has stable vitals with no sx, but will get another unit for low h/h. Will recheck tonight to make sure not trended down further.  Pt desires to keep catheter for now.   Otherwise routine post op and postpartum care.  Expect d/c 1-2 days.  Oxycodone for pain control.

## 2022-08-02 NOTE — Progress Notes (Signed)
Pt off unit

## 2022-08-02 NOTE — Lactation Note (Signed)
This note was copied from a baby's chart.  NICU Lactation Consultation Note  Patient Name: Girl Itha Kroeker CHENI'D Date: 08/02/2022 Age:42 hours  Subjective Reason for consult: Follow-up assessment; NICU baby; Infant < 6lbs; Preterm <34wks; Maternal endocrine disorder; Other (Comment) (AMA)  Visited with mom of 52 hours old pre-term NICU female, Ms. Piech is a P3 and experienced breastfeeding but this is her first baby in NICU. She reports she's been pumping every 3 hours despite getting another blood transfusion, praised her for her efforts. She has decided to do the [place] pump through her insurance, RN will be putting order in Epic, parents have the paper form for provider to fill out tomorrow. She also requested # 30 flanges, they were provided and will let lactation know tomorrow if the # 27 or the # 30 is a better fit for her.  Objective Infant data: Mother's Current Feeding Choice: Breast Milk and Donor Milk  Infant feeding assessment Scale for Readiness: 3  Maternal data: [id number]  C-Section, Low Transverse Significant Breast History:: (++) breast changes during the pregnancy Current breast feeding challenges:: NICU admission Previous breastfeeding challenges?: Low milk supply Does the patient have breastfeeding experience prior to this delivery?: Yes How long did the patient breastfeed?: 1st for 9 months, 2nd one for 14 months  Pumping frequency: 8 times/24 hours Pumped volume: 0 mL Flange Size: 27; 30 Risk factor for low milk supply:: prematurity, AMA, blood loss of 3058 cc. due to C/S and hysterectomy Pump:  (Asked OB Specialty care RN Mai to put order for [facility] pump)  Assessment Infant: Feeding Status: NPO  Maternal: Milk volume: Normal  Intervention/Plan Interventions: Breast feeding basics reviewed; DEBP; Education Tools: Flanges (patient requested flanges # 30 on 08/02/2022) Pump Education: Setup, frequency, and cleaning; Milk Storage  Plan of  care: Encouraged pumping every 3 hours, ideally 8 pumping sessions/24 hours Breast massage, hand expression and coconut oil were also encouraged prior pumping   FOB present and very supportive. All questions and concerns answered, family to contact [facility] services PRN.  Consult Status: NICU follow-up  NICU Follow-up type: Maternal D/C visit; Verify onset of copious milk; Verify absence of engorgement   Eliza Grissinger S Shabre Kreher 08/02/2022, 2:36 PM

## 2022-08-03 NOTE — Lactation Note (Signed)
This note was copied from a baby's chart.  NICU Lactation Consultation Note  Patient Name: Brandi [name] Date: 08/03/2022 Age:42 hours  Subjective Reason for consult: Follow-up assessment; NICU baby; Infant < 6lbs; Maternal endocrine disorder; Other (Comment); Preterm <34wks (AMA)  Visited with mom of 58 hours old pre-term NICU female, Ms. Pollet is a P3 and experienced breastfeeding but this is her first baby in NICU. She reports she's now getting several droplets of colostrum when pumping, praised her for her efforts. Reviewed benefits of STS care and how it can help with her supply. She requested another set of # 30 flanges for her stork pump, they have been working out for her.   Objective Infant data: Mother's Current Feeding Choice: Breast Milk and Donor Milk  Infant feeding assessment Scale for Readiness: 3  Maternal data: F7P1025  C-Section, Low Transverse Pumping frequency: 7 times/24 hours Pumped volume: 0 mL (drops after doing STS with baby) Flange Size: 30 Pump: Stork Pump  Assessment Infant: In NICU  Maternal: Milk volume: Normal  Intervention/Plan Interventions: Breast feeding basics reviewed; DEBP; Education Tools: Pump; Flanges; Coconut oil Pump Education: Setup, frequency, and cleaning; Milk Storage  Plan of care: Encouraged pumping every 3 hours, ideally 8 pumping sessions/24 hours Breast massage, hand expression and coconut oil were also encouraged prior pumping Parents will do as much STS they can with baby   FOB present and very supportive. All questions and concerns answered, family to contact [facility] services PRN.  Consult Status: NICU follow-up  NICU Follow-up type: Maternal D/C visit; Verify onset of copious milk; Verify absence of engorgement   Elan Mcelvain S Berlie Hatchel 08/03/2022, 12:23 PM

## 2022-08-03 NOTE — Progress Notes (Signed)
  Patient is eating, ambulating, voiding with cath in place.  Pain control is good.  Vitals:   08/02/22 1232 08/02/22 1634 08/02/22 2001 08/02/22 2205  BP: (!) 106/59 125/65 116/67 113/72  Pulse: 70 91 87 85  Resp: '17 16 16 17  '$ Temp: 99 F (37.2 C) 98.6 F (37 C)  98.4 F (36.9 C)  TempSrc: Oral Oral  Oral  SpO2: 97% 100% 100% 100%  Weight:      Height:        lungs:   clear to auscultation cor:    RRR Abdomen:  soft, appropriate tenderness, incisions intact and without erythema or exudate ex:    no cords   Lab Results  Component Value Date   WBC 8.6 08/02/2022   HGB 8.2 (L) 08/02/2022   HCT 23.4 (L) 08/02/2022   MCV 85.7 08/02/2022   PLT 154 08/02/2022    --/--/A POS (09/08 0035)/RI  A/P    Post operative day 2 from C-Hyst, now s/p 6 total units of PRBCs. H/H stable post last unit, iron. Will encourage pt to allow for removal of cath today.   Otherwise routine post op and postpartum care.  Expect d/c possibly tomorrow or day after.  Baby stable in NICU.  Oxycodone for pain control.

## 2022-08-04 LAB — SURGICAL PATHOLOGY

## 2022-08-04 NOTE — Progress Notes (Addendum)
Patient seen and examined in NICU.  She is doing skin to skin with baby.  Reports that yesterday evening she passed a large clot.  Bleeding to follow was minimal until this AM when she passed two smaller clots.   Reports that her appetite is normal--denies nausea, vomiting.  +Flatus and had a BM this AM. She is ambulating and voiding.  Pain is controlled. Vitals:   08/03/22 2027 08/03/22 2336 08/04/22 0246 08/04/22 0910  BP: 119/80 116/76 123/72 130/71  Pulse: 89 84 92 82  Resp: '18 18 18 18  '$ Temp: 97.9 F (36.6 C) 97.7 F (36.5 C)  98.8 F (37.1 C)  TempSrc: Oral Oral  Oral  SpO2: 99% 99% 98%   Weight:      Height:        NAD Skin to skin with baby   Lab Results  Component Value Date   WBC 8.6 08/02/2022   HGB 8.2 (L) 08/02/2022   HCT 23.4 (L) 08/02/2022   MCV 85.7 08/02/2022   PLT 154 08/02/2022    --/--/A POS (09/08 0035)  A/P    41 y.o. 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    Patient skin to skin currently but feeling well.  Will return to examine abdomen when she returns to room. Will monitor bleeding today, however, is reassuring that she is eating well and having BM without difficulty.  Overall low suspicion for intra-abdominal bleeding   ADDENDUM:  Patient examined in her room Abdomen soft, non-distended, appropriate tenderness near incision Ext: no edema, symmetric  Continue close observation

## 2022-08-04 NOTE — Progress Notes (Signed)
Patient screened out for psychosocial assessment since none of the following apply:  Psychosocial stressors documented in mother or baby's chart  Gestation less than 32 weeks  Code at delivery   Infant with anomalies Please contact the Clinical Social Worker if specific needs arise, by MOB's request, or if MOB scores greater than 9/yes to question 10 on Edinburgh Postpartum Depression Screen.  Delvon Chipps, LCSW Clinical Social Worker [facility] Cell#: [phone]     

## 2022-08-04 NOTE — Lactation Note (Signed)
This note was copied from a baby's chart.  NICU Lactation Consultation Note  Patient Name: Brandi [name] Today's Date: 08/04/2022 Age:42 days  Subjective Reason for consult: Follow-up assessment; NICU baby; Infant < 6lbs; Maternal endocrine disorder; Other (Comment) (AMA)  Visited with family of 81 hours old pre-term NICU female, Brandi [name] is a a P3 and experienced breastfeeding but this is her first baby in NICU. She reports she's now getting enough colostrum to turn in to the NICU when pumping, mom doing STS with baby when entered the room; praised her for her efforts. Parents requested another pump kit to be set up in baby's room, this LC set it up and revised pumping settings, MOB had already been using the expression setting on the pump and reported her nipples started to get sore. She hasn't been using any coconut oil because she was afraid it will transfer to her milk. Explained that our coconut oil is food grade and provided tips on getting a replacement once she runs out of ours; parents were very appreciative.  Objective Infant data: Mother's Current Feeding Choice: Breast Milk and Donor Milk  Infant feeding assessment Scale for Readiness: 3  Maternal data: G3P2103  C-Section, Low Transverse Pumping frequency: 7 times/24 hours Pumped volume: 15 mL Flange Size: 30 Pump: Stork Pump  Assessment Infant: In NICU  Maternal: Milk volume: Normal  Intervention/Plan Interventions: Breast feeding basics reviewed; DEBP; Education Tools: Pump; Flanges; Coconut oil (Set up a second kit on 08/04/2022) Pump Education: Setup, frequency, and cleaning; Milk Storage  Plan of care: Encouraged pumping every 3 hours, ideally 8 pumping sessions/24 hours Breast massage, hand expression and coconut oil were also encouraged prior pumping She'll switch her pump settings from initiation to expression mode once she starts getting 20 ml of EBM combined Parents will do as much STS they can  with baby   FOB present and very supportive. All questions and concerns answered, family to contact LC services PRN.  Consult Status: NICU follow-up  NICU Follow-up type: Maternal D/C visit; Verify onset of copious milk; Verify absence of engorgement   Brandi [name] 08/04/2022, 10:56 AM 

## 2022-08-05 ENCOUNTER — Ambulatory Visit: Payer: Self-pay

## 2022-08-05 LAB — BPAM RBC
Blood Product Expiration Date: 202309252359
Blood Product Expiration Date: 202309292359
Blood Product Expiration Date: 202309302359
Blood Product Expiration Date: 202310022359
Blood Product Expiration Date: 202310022359
Blood Product Expiration Date: 202310022359
Blood Product Expiration Date: 202310022359
Blood Product Expiration Date: 202310022359
Blood Product Expiration Date: 202310022359
Blood Product Expiration Date: 202310042359
Blood Product Expiration Date: 202310042359
Blood Product Expiration Date: 202310042359
Blood Product Expiration Date: 202310042359
Blood Product Expiration Date: 202310042359
ISSUE DATE / TIME: 202309080153
ISSUE DATE / TIME: 202309080153
ISSUE DATE / TIME: 202309080206
ISSUE DATE / TIME: 202309080206
ISSUE DATE / TIME: 202309080206
ISSUE DATE / TIME: 202309080206
ISSUE DATE / TIME: 202309080206
ISSUE DATE / TIME: 202309080206
ISSUE DATE / TIME: 202309080206
ISSUE DATE / TIME: 202309081319
ISSUE DATE / TIME: 202309081547
ISSUE DATE / TIME: 202309090422
ISSUE DATE / TIME: 202309090954
ISSUE DATE / TIME: 202309110428
Unit Type and Rh: 6200
Unit Type and Rh: 6200
Unit Type and Rh: 6200
Unit Type and Rh: 6200
Unit Type and Rh: 6200
Unit Type and Rh: 6200
Unit Type and Rh: 6200
Unit Type and Rh: 6200
Unit Type and Rh: 6200
Unit Type and Rh: 6200
Unit Type and Rh: 6200
Unit Type and Rh: 6200
Unit Type and Rh: 6200
Unit Type and Rh: 6200

## 2022-08-05 LAB — TYPE AND SCREEN
ABO/RH(D): A POS
Antibody Screen: NEGATIVE
Unit division: 0
Unit division: 0
Unit division: 0
Unit division: 0
Unit division: 0
Unit division: 0
Unit division: 0
Unit division: 0
Unit division: 0
Unit division: 0
Unit division: 0
Unit division: 0
Unit division: 0
Unit division: 0

## 2022-08-05 MED ORDER — ACETAMINOPHEN 500 MG PO TABS: 1000.0000 mg | ORAL_TABLET | Freq: Four times a day (QID) | ORAL | 0 refills | Status: AC

## 2022-08-05 MED ORDER — SERTRALINE HCL 50 MG PO TABS: 50.0000 mg | ORAL_TABLET | Freq: Every day | ORAL | 1 refills | Status: AC

## 2022-08-05 MED ORDER — OXYCODONE HCL 5 MG PO TABS: 5.0000 mg | ORAL_TABLET | ORAL | 0 refills | Status: AC | PRN

## 2022-08-05 MED ORDER — IBUPROFEN 600 MG PO TABS: 600.0000 mg | ORAL_TABLET | Freq: Four times a day (QID) | ORAL | 0 refills | Status: AC

## 2022-08-05 MED ORDER — IBUPROFEN 600 MG PO TABS
600.0000 mg | ORAL_TABLET | Freq: Four times a day (QID) | ORAL | Status: DC
  Administered 2022-08-05 (×2): 600 mg via ORAL
  Filled 2022-08-05 (×2): qty 1

## 2022-08-05 NOTE — Discharge Summary (Signed)
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 yo G3P2103 at 374w0das admitted to the hospital 07/04/2022 for bleeding with a known placenta previa.  She remained in-house for a month given the risk of recurrent bleeding and she lived over 40 minutes from hospital.  On the day of c-section she had a moderate bleed within 24 hours and then later the baby began to have prolonged decelerations to the 90's intermittently. Given this change in FHR and recent bleeding, the decision was made to proceed to c-section for the following indication:Previa and fetal heart rate decelerations .Delivery details are as follows:  Membrane Rupture Time/Date: 1:40 AM ,08/01/2022   Delivery Method:C-Section, Low Transverse , followed by hysterectomy.  Details of operation can be found in separate operative note.  Patient had a postpartum course significant for a placenta accreta with massive hemorrhage requiring a total of 6 units of PRBC's.  She recovered well postoperatively and on day of discharge is ambulating, tolerating a regular diet, passing flatus, and urinating well. Patient is discharged home in  stable condition on  08/05/22        Newborn Data: Birth date:08/01/2022  Birth time:1:41 AM  Gender:Female  Living status:Living  Apgars:7 ,8  Weight:2160 g     Magnesium Sulfate received: No BMZ received: Yes  Transfusion:Yes  Physical exam  Vitals:   08/04/22 1507 08/04/22 2100 08/05/22 0224 08/05/22 0733  BP: 127/76 130/68 109/75 99/60  Pulse: 85 90 87 88  Resp: '18 18 18 18  '$ Temp: 98.2 F (36.8 C) 99.4 F (37.4 C) 97.8 F (36.6 C) 98.8 F (37.1 C)  TempSrc: Oral Oral Oral Oral  SpO2: 98% 100% 99% 96%  Weight:      Height:       General: alert and cooperative Lochia: appropriate Uterine Fundus: firm Incision: Dressing is clean, dry, and intact DVT Evaluation: No evidence of DVT seen on physical exam. Labs: Lab Results  Component Value Date   WBC 8.6 08/02/2022   HGB 8.2 (L) 08/02/2022   HCT 23.4 (L) 08/02/2022   MCV 85.7 08/02/2022   PLT 154 08/02/2022      Latest Ref Rng & Units 08/01/2022    4:03 AM  CMP  Glucose 70 - 99 mg/dL 232   BUN 6 - 20 mg/dL 10   Creatinine 0.44 - 1.00 mg/dL 0.59   Sodium 135 - 145 mmol/L 137   Potassium 3.5 - 5.1 mmol/L 3.5   Chloride 98 - 111 mmol/L 110   CO2 22 - 32 mmol/L 18   Calcium 8.9 - 10.3 mg/dL 7.7  Edinburgh Score:     No data to display           After visit meds:  Allergies as of 08/05/2022       Reactions   Adhesive [tape] Rash   Some bandaids   Neosporin [neomycin-bacitracin Zn-polymyx] Rash   Sulfa Antibiotics Rash        Medication List     TAKE these medications    acetaminophen 500 MG tablet Commonly known as: TYLENOL Take 2 tablets (1,000 mg total) by mouth every 6 (six) hours. What changed:  medication strength how much to take when to take this reasons to take this   ferrous gluconate 324 MG tablet Commonly known as: FERGON Take 324 mg by mouth daily with breakfast.   ibuprofen 600 MG tablet Commonly known as: ADVIL Take 1 tablet (600 mg total) by mouth every 6 (six)  hours.   levothyroxine 50 MCG tablet Commonly known as: SYNTHROID TAKE 1 TABLET BY MOUTH EVERY DAY BEFORE BREAKFAST   oxyCODONE 5 MG immediate release tablet Commonly known as: Oxy IR/ROXICODONE Take 1-2 tablets (5-10 mg total) by mouth every 4 (four) hours as needed for moderate pain.   PRENATAL VITAMINS PO Take by mouth.   sertraline 50 MG tablet Commonly known as: ZOLOFT Take 1 tablet (50 mg total) by mouth daily. Start taking on: August 06, 2022 What changed:  medication strength how much to take               Durable Medical Equipment  (From admission, onward)           Start     Ordered   08/02/22 1512  For home use only DME double electric breast pump  Once       Comments: ICD-10 code: Z39.1   08/02/22 1513             Discharge home in stable condition Infant Feeding:  Patient pumping for baby in NICU Infant Disposition:NICU Discharge instruction: per After Visit Summary and Postpartum booklet. Activity: Advance as tolerated. Pelvic rest for 6 weeks.  Diet: routine diet Future Appointments:No future appointments. Follow up Visit:  Follow-up Information     Paula Compton, MD Follow up.   Specialty: Obstetrics and Gynecology Why: Incision check.  Office should call with date and time Contact information: [address] [place] [address] [phone]                  Please schedule this patient for a In person postpartum visit in  10 days  with the following provider: MD.  Delivery mode:  C-Section, Low Transverse  followed by hysterectomy Anticipated Birth Control:   Hysterectomy   08/05/2022 Logan Bores, MD

## 2022-08-05 NOTE — Plan of Care (Signed)
Patient given discharge instruction, call MD if temp > 100.4, VB, clots, dizziness, pain. Patient to receive f/u appointment from office. Post Op medications reviewed with patients and patient verbalizes understanding.

## 2022-08-05 NOTE — Progress Notes (Signed)
Subjective: Postpartum Day 4: Cesarean Hysterectomy/hemorrhage  Patient feeling better and ambulating without problem.  Voiding normally.  No further large clots passed--likely were just residual in top of vagina from surgery.  Pain controlled as long as maintains medication on schedule.  No nausea and tolerating po well.   Baby doing well, pumping and starting to get milk.   Objective: Vital signs in last 24 hours: Temp:  [97.8 F (36.6 C)-99.4 F (37.4 C)] 98.8 F (37.1 C) (09/12 0733) Pulse Rate:  [79-90] 88 (09/12 0733) Resp:  [18] 18 (09/12 0733) BP: (99-130)/(60-78) 99/60 (09/12 0733) SpO2:  [96 %-100 %] 96 % (09/12 0733)  Physical Exam:  General: alert and cooperative Lochia: appropriate Uterine Fundus: absent, abdomen soft, dressing intact with minimal drainage   Recent Labs    08/02/22 1953  HGB 8.2*  HCT 23.4*    Assessment/Plan: Status post Cesarean section. Doing well postoperatively.  Discharge home with standard precautions and return to office next week. Reviewed incision care and to call with fever or any other unusual symptoms.  Logan Bores, MD 08/05/2022, 11:14 AM

## 2022-08-05 NOTE — Lactation Note (Signed)
This note was copied from a baby's chart.  NICU Lactation Consultation Note  Patient Name: Brandi [name] [id number] Date: 08/05/2022 Age:42 days  Subjective Reason for consult: Follow-up assessment; NICU baby; Preterm <34wks; Infant < 6lbs; Maternal endocrine disorder; Other (Comment); Maternal discharge (AMA)  Visited with family of 42 days old pre-term NICU female, Brandi [name] is a a P3 and experienced breastfeeding but this is her first baby in NICU. She reports she's been pumping consistently every 3 hours and her supply continues to increase, already using expression mode in her pump; praised her for her efforts. She got discharged today. Reviewed discharge education, lactogenesis II/III, pumping volumes and anticipatory guidelines for the next few days. Her husband will be going to the gift shop this afternoon to get a rental pump, she'll use the one in baby's room in the meantime. She also has a stork pump for home use.  Objective Infant data: Mother's Current Feeding Choice: Breast Milk and Donor Milk  Infant feeding assessment Scale for Readiness: 3  Maternal data: [id number]  C-Section, Low Transverse Pumping frequency: 8 times/24 hours Pumped volume: 30 mL Flange Size: 30 Pump: Stork Pump  Assessment Infant: In NICU  Maternal: Milk volume: Normal  Intervention/Plan Interventions: Breast feeding basics reviewed; DEBP; Education Tools: Pump; Flanges; Coconut oil (set up a 2nd pumping kit on 08/04/2022) Pump Education: Setup, frequency, and cleaning; Milk Storage  Plan of care: Encouraged to continue pumping every 3 hours, ideally 8 pumping sessions/24 hours Parents will take all pump parts to baby's room after maternal discharge Parents will do as much STS they can with baby Family plans to rent a pump from the gift shop for the first 2 weeks post-partum   No other support person at this time. All questions and concerns answered, family to contact [facility] services  PRN.  Consult Status: NICU follow-up  NICU Follow-up type: Verify onset of copious milk; Verify absence of engorgement; Weekly NICU follow up   [employer] 08/05/2022, 3:08 PM

## 2022-08-08 ENCOUNTER — Encounter: Payer: Self-pay | Admitting: Obstetrics and Gynecology

## 2022-08-08 ENCOUNTER — Ambulatory Visit: Payer: Self-pay

## 2022-08-08 DIAGNOSIS — D62 Acute posthemorrhagic anemia: Secondary | ICD-10-CM | POA: Insufficient documentation

## 2022-08-08 NOTE — Lactation Note (Addendum)
This note was copied from a baby's chart.  NICU Lactation Consultation Note  Patient Name: Brandi [name] [id number] Date: 08/08/2022 Age:41 years   Subjective Reason for consult: Follow-up assessment  Lactation followed up with Ms. Sabra Heck. She was holding baby and pumping upon entry. We discussed benefits of STS, and I provided education on efficacy of lick and learn on a pre-pumped breast. I educated on when it's appropriate to provide lick and learn (infant feeding cues) and signs of infant stress to indicate cessation of lick and learn.  Parent states that she would like to withhold introduction of bottles at this time. I provided education infant readiness of PO feeding and feeding support services via the LCs and SLPs.  We reviewed progress with breast pumping. Parent endorses onset of lactogenesis II. Parent may need to split some of her time between children at home and baby. I provided reassurance that she can continue to practice feedings while present.  Parent is using size 30 flanges at this time and finds it is a supportive fit.  Objective Infant data: Mother's Current Feeding Choice: Breast Milk   Maternal data: [id number]  C-Section, Low Transverse Current breast feeding challenges:: NICU; preterm  Does the patient have breastfeeding experience prior to this delivery?: Yes  Pumping frequency: recommended q3 hours Pumped volume: 60 mL  Pump: Stork Pump, Refer for rental (has rental pump)  Assessment  Maternal: Milk volume: Normal  Intervention/Plan Interventions: Breast feeding basics reviewed; Education  Tools: Pump Pump Education: Setup, frequency, and cleaning  Plan: Consult Status: NICU follow-up  NICU Follow-up type: Weekly NICU follow up; Verify absence of engorgement; Verify onset of copious milk    Lenore Manner 08/08/2022, 9:42 AM

## 2022-08-09 ENCOUNTER — Telehealth (HOSPITAL_COMMUNITY): Payer: Self-pay | Admitting: *Deleted

## 2022-08-09 NOTE — Telephone Encounter (Signed)
Patient voiced no questions or concerns regarding her health at this time. EPDS not completed per patient request. Stated, "I remember what I put down, and I'm fine." Infant remains in NICU. Patient informed about hospital's postpartum classes and support groups. Declined email information at this time. Erline Levine, RN, 08/09/22, 1146

## 2022-08-11 DIAGNOSIS — D225 Melanocytic nevi of trunk: Secondary | ICD-10-CM | POA: Diagnosis not present

## 2022-08-11 DIAGNOSIS — D2272 Melanocytic nevi of left lower limb, including hip: Secondary | ICD-10-CM | POA: Diagnosis not present

## 2022-08-11 DIAGNOSIS — D2262 Melanocytic nevi of left upper limb, including shoulder: Secondary | ICD-10-CM | POA: Diagnosis not present

## 2022-08-11 DIAGNOSIS — D2261 Melanocytic nevi of right upper limb, including shoulder: Secondary | ICD-10-CM | POA: Diagnosis not present

## 2022-08-12 DIAGNOSIS — O4432 Partial placenta previa with hemorrhage, second trimester: Secondary | ICD-10-CM | POA: Diagnosis not present

## 2022-08-13 ENCOUNTER — Ambulatory Visit (INDEPENDENT_AMBULATORY_CARE_PROVIDER_SITE_OTHER): Payer: BC Managed Care – PPO

## 2022-08-13 DIAGNOSIS — Z23 Encounter for immunization: Secondary | ICD-10-CM

## 2022-08-18 ENCOUNTER — Ambulatory Visit: Payer: Self-pay

## 2022-08-18 NOTE — Lactation Note (Signed)
This note was copied from a baby's chart. Lactation Consultation Note  Patient Name: Brandi [name] LEXNT'Z Date: 08/18/2022 Reason for consult: Follow-up assessment;Preterm <34wks;Infant < 6lbs Age:42 wk.o.  Maternal Data    Lactation follow-up. Mom continues to pump routinely, although recently changed from 8x/24hrs for 15 minutes to 7x/24 hours for 30 minutes- she has stopped the midnight pump session. We discussed pumping routine/plan, milk supply and demand, recommendation of minimum 8x/24hours, and how to work that with a longer period at night.  Feeding Mother's Current Feeding Choice: Breast Milk  LATCH Score Latch: Grasps breast easily, tongue down, lips flanged, rhythmical sucking.  Audible Swallowing: A few with stimulation  Type of Nipple: Everted at rest and after stimulation  Comfort (Breast/Nipple): Soft / non-tender  Hold (Positioning): No assistance needed to correctly position infant at breast.  LATCH Score: 9  Baby has been working on lick and learns on pumped breast. Mom had baby in cross cradle position. Attempts made at the breast- baby would grasp, suckly and let go.  We initiated a nipple shield; educating mom on purpose, time frame, and use. With a size 76m NS, baby grasped the breast again easily, but this time maintained her latch. She had off/on suckles with periods of consistency and visible swallows. After 3-5 minutes baby did become tired and held nipple in mouth. NS had milk in shield when baby was taken off.  Lactation Tools Discussed/Used Tools: Nipple Shields Nipple shield size: 24  Interventions Interventions: Support pillows;Breast feeding basics reviewed (nipple shield use)  Discharge    Consult Status    Encouraged continued pumping, exposure to lick and learn and feeding attempts at the breast. Discussed up/down pattern of baby's energy and gave reassurance that it's ok if baby is tired at next attempt.  SLavonia Drafts9/25/2023, 12:04 PM

## 2022-08-23 ENCOUNTER — Ambulatory Visit: Payer: Self-pay

## 2022-08-23 NOTE — Lactation Note (Signed)
This note was copied from a baby's chart. Lactation Consultation Note  Patient Name: Brandi [name] [id number] Date: 08/23/2022 Reason for consult: Follow-up assessment;NICU baby;Preterm <34wks Age:42 wk.o.  Maternal Data  Mom starting feeling pain in right breast last night, described as sometimes shooting pain, I observed redness and firmness midline and inner aspect of breast above areola, she took her temp last night and was 99.9 oral, I took her temp and it was 99.2 oral.  She states she is feeling tired today, sl achy, had used warm compress with massage before nursing this feeding, thinking she had a clogged duct, I encouraged her to call her OB on call in [place] today and report her symptoms, she has a hx of mastitis with previous child, also this breast has decreased milk production compared to left and this could increase her risk of mastitis, mom to pump after baby finishes feeding to further empty breast, informed she could still breast feed baby and use the pumped breastmilk.          Feeding Mother's Current Feeding Choice: Breast Milk  LATCH Score Latch: Grasps breast easily, tongue down, lips flanged, rhythmical sucking.  Audible Swallowing: Spontaneous and intermittent  Type of Nipple: Everted at rest and after stimulation  Comfort (Breast/Nipple): Filling, red/small blisters or bruises, mild/mod discomfort (redness of right breast, shooting pains)  Hold (Positioning): No assistance needed to correctly position infant at breast.  LATCH Score: 9   Lactation Tools Discussed/Used    Interventions Interventions: DEBP (warmth before nursing, massage firm areas while nursing/pumping, call OB and report symptomsj)  Discharge Pump: DEBP;Personal WIC Program: No  Consult Status Consult Status: NICU follow-up Date: 08/24/22 Follow-up type: In-patient    Ferol Luz 08/23/2022, 12:02 PM

## 2022-09-10 DIAGNOSIS — Z1389 Encounter for screening for other disorder: Secondary | ICD-10-CM | POA: Diagnosis not present

## 2022-09-10 DIAGNOSIS — Z3009 Encounter for other general counseling and advice on contraception: Secondary | ICD-10-CM | POA: Diagnosis not present

## 2022-09-10 DIAGNOSIS — O99282 Endocrine, nutritional and metabolic diseases complicating pregnancy, second trimester: Secondary | ICD-10-CM | POA: Diagnosis not present

## 2022-09-18 ENCOUNTER — Encounter: Payer: BC Managed Care – PPO | Admitting: Family Medicine

## 2023-03-09 DIAGNOSIS — N6320 Unspecified lump in the left breast, unspecified quadrant: Secondary | ICD-10-CM | POA: Diagnosis not present

## 2023-03-10 ENCOUNTER — Encounter: Payer: Self-pay | Admitting: Obstetrics and Gynecology

## 2023-03-10 ENCOUNTER — Other Ambulatory Visit: Payer: Self-pay | Admitting: Obstetrics and Gynecology

## 2023-03-10 DIAGNOSIS — N6320 Unspecified lump in the left breast, unspecified quadrant: Secondary | ICD-10-CM

## 2023-03-11 ENCOUNTER — Encounter: Payer: Self-pay | Admitting: Obstetrics and Gynecology

## 2023-03-12 ENCOUNTER — Other Ambulatory Visit: Payer: Self-pay | Admitting: Obstetrics and Gynecology

## 2023-03-12 DIAGNOSIS — N63 Unspecified lump in unspecified breast: Secondary | ICD-10-CM

## 2023-03-16 ENCOUNTER — Encounter: Payer: Self-pay | Admitting: Obstetrics and Gynecology

## 2023-03-17 ENCOUNTER — Other Ambulatory Visit: Payer: Self-pay | Admitting: Obstetrics and Gynecology

## 2023-03-17 DIAGNOSIS — N63 Unspecified lump in unspecified breast: Secondary | ICD-10-CM

## 2023-03-18 ENCOUNTER — Ambulatory Visit
Admission: RE | Admit: 2023-03-18 | Discharge: 2023-03-18 | Disposition: A | Discharge status: Home / Self Care | Payer: BC Managed Care – PPO | Source: Ambulatory Visit | Attending: Obstetrics and Gynecology | Admitting: Obstetrics and Gynecology

## 2023-03-18 DIAGNOSIS — B9562 Methicillin resistant Staphylococcus aureus infection as the cause of diseases classified elsewhere: Secondary | ICD-10-CM | POA: Diagnosis not present

## 2023-03-18 DIAGNOSIS — N611 Abscess of the breast and nipple: Secondary | ICD-10-CM | POA: Diagnosis not present

## 2023-03-18 DIAGNOSIS — N63 Unspecified lump in unspecified breast: Secondary | ICD-10-CM

## 2023-03-18 DIAGNOSIS — N6489 Other specified disorders of breast: Secondary | ICD-10-CM | POA: Diagnosis not present

## 2023-03-18 DIAGNOSIS — R92343 Mammographic extreme density, bilateral breasts: Secondary | ICD-10-CM | POA: Diagnosis not present

## 2023-03-18 MED ORDER — LIDOCAINE-EPINEPHRINE 1 %-1:100000 IJ SOLN
10.0000 mL | Freq: Once | INTRAMUSCULAR | Status: AC
  Administered 2023-03-18: 10 mL via INTRADERMAL

## 2023-03-18 MED ORDER — LIDOCAINE HCL (PF) 1 % IJ SOLN
10.0000 mL | Freq: Once | INTRAMUSCULAR | Status: AC
  Administered 2023-03-18: 10 mL via INTRADERMAL

## 2023-03-19 LAB — AEROBIC/ANAEROBIC CULTURE W GRAM STAIN (SURGICAL/DEEP WOUND)

## 2023-03-20 LAB — AEROBIC/ANAEROBIC CULTURE W GRAM STAIN (SURGICAL/DEEP WOUND)

## 2023-03-21 LAB — AEROBIC/ANAEROBIC CULTURE W GRAM STAIN (SURGICAL/DEEP WOUND)

## 2023-03-23 LAB — AEROBIC/ANAEROBIC CULTURE W GRAM STAIN (SURGICAL/DEEP WOUND)

## 2023-04-02 ENCOUNTER — Other Ambulatory Visit: Payer: Self-pay | Admitting: Obstetrics and Gynecology

## 2023-04-02 DIAGNOSIS — N611 Abscess of the breast and nipple: Secondary | ICD-10-CM

## 2023-04-02 DIAGNOSIS — N6320 Unspecified lump in the left breast, unspecified quadrant: Secondary | ICD-10-CM

## 2023-04-03 ENCOUNTER — Encounter: Payer: Self-pay | Admitting: Obstetrics and Gynecology

## 2023-04-08 ENCOUNTER — Ambulatory Visit
Admission: RE | Admit: 2023-04-08 | Discharge: 2023-04-08 | Disposition: A | Discharge status: Home / Self Care | Payer: BC Managed Care – PPO | Source: Ambulatory Visit | Attending: Obstetrics and Gynecology | Admitting: Obstetrics and Gynecology

## 2023-04-08 DIAGNOSIS — N6489 Other specified disorders of breast: Secondary | ICD-10-CM | POA: Diagnosis not present

## 2023-04-08 DIAGNOSIS — N611 Abscess of the breast and nipple: Secondary | ICD-10-CM | POA: Diagnosis not present

## 2023-08-11 DIAGNOSIS — L538 Other specified erythematous conditions: Secondary | ICD-10-CM | POA: Diagnosis not present

## 2023-08-11 DIAGNOSIS — L578 Other skin changes due to chronic exposure to nonionizing radiation: Secondary | ICD-10-CM | POA: Diagnosis not present

## 2023-08-11 DIAGNOSIS — X32XXXA Exposure to sunlight, initial encounter: Secondary | ICD-10-CM | POA: Diagnosis not present

## 2023-08-11 DIAGNOSIS — D485 Neoplasm of uncertain behavior of skin: Secondary | ICD-10-CM | POA: Diagnosis not present

## 2023-08-11 DIAGNOSIS — D225 Melanocytic nevi of trunk: Secondary | ICD-10-CM | POA: Diagnosis not present

## 2023-08-11 DIAGNOSIS — Z85828 Personal history of other malignant neoplasm of skin: Secondary | ICD-10-CM | POA: Diagnosis not present

## 2023-08-27 DIAGNOSIS — D225 Melanocytic nevi of trunk: Secondary | ICD-10-CM | POA: Diagnosis not present

## 2023-09-08 DIAGNOSIS — Z4801 Encounter for change or removal of surgical wound dressing: Secondary | ICD-10-CM | POA: Diagnosis not present

## 2023-10-06 DIAGNOSIS — Z130 Encounter for screening for diseases of the blood and blood-forming organs and certain disorders involving the immune mechanism: Secondary | ICD-10-CM | POA: Diagnosis not present

## 2023-10-06 DIAGNOSIS — F419 Anxiety disorder, unspecified: Secondary | ICD-10-CM | POA: Diagnosis not present

## 2023-10-06 DIAGNOSIS — E039 Hypothyroidism, unspecified: Secondary | ICD-10-CM | POA: Diagnosis not present

## 2023-10-06 DIAGNOSIS — Z01419 Encounter for gynecological examination (general) (routine) without abnormal findings: Secondary | ICD-10-CM | POA: Diagnosis not present

## 2023-10-06 DIAGNOSIS — Z124 Encounter for screening for malignant neoplasm of cervix: Secondary | ICD-10-CM | POA: Diagnosis not present

## 2024-08-08 DIAGNOSIS — D2262 Melanocytic nevi of left upper limb, including shoulder: Secondary | ICD-10-CM | POA: Diagnosis not present

## 2024-08-08 DIAGNOSIS — D2272 Melanocytic nevi of left lower limb, including hip: Secondary | ICD-10-CM | POA: Diagnosis not present

## 2024-08-08 DIAGNOSIS — L538 Other specified erythematous conditions: Secondary | ICD-10-CM | POA: Diagnosis not present

## 2024-08-08 DIAGNOSIS — L820 Inflamed seborrheic keratosis: Secondary | ICD-10-CM | POA: Diagnosis not present

## 2024-08-08 DIAGNOSIS — D485 Neoplasm of uncertain behavior of skin: Secondary | ICD-10-CM | POA: Diagnosis not present

## 2024-08-08 DIAGNOSIS — D225 Melanocytic nevi of trunk: Secondary | ICD-10-CM | POA: Diagnosis not present

## 2024-08-08 DIAGNOSIS — D2261 Melanocytic nevi of right upper limb, including shoulder: Secondary | ICD-10-CM | POA: Diagnosis not present

## 2024-09-12 DIAGNOSIS — H0019 Chalazion unspecified eye, unspecified eyelid: Secondary | ICD-10-CM | POA: Diagnosis not present

## 2024-09-22 DIAGNOSIS — L905 Scar conditions and fibrosis of skin: Secondary | ICD-10-CM | POA: Diagnosis not present

## 2024-09-22 DIAGNOSIS — D225 Melanocytic nevi of trunk: Secondary | ICD-10-CM | POA: Diagnosis not present
